# Patient Record
Sex: Female | Born: 1955 | Race: White | Hispanic: No | Smoking: Former smoker
Health system: Southern US, Community
[De-identification: ages and names within clinical notes are randomized; demographics above are authoritative.]

## PROBLEM LIST (undated history)

## (undated) DIAGNOSIS — N200 Calculus of kidney: Secondary | ICD-10-CM

## (undated) DIAGNOSIS — C801 Malignant (primary) neoplasm, unspecified: Secondary | ICD-10-CM

## (undated) DIAGNOSIS — R011 Cardiac murmur, unspecified: Secondary | ICD-10-CM

## (undated) DIAGNOSIS — N189 Chronic kidney disease, unspecified: Secondary | ICD-10-CM

## (undated) HISTORY — PX: SKIN CANCER EXCISION: SHX779

## (undated) HISTORY — PX: DILATION AND CURETTAGE OF UTERUS: SHX78

---

## 1997-01-22 DIAGNOSIS — N200 Calculus of kidney: Secondary | ICD-10-CM

## 1997-01-22 HISTORY — DX: Calculus of kidney: N20.0

## 2008-01-23 HISTORY — PX: BACK SURGERY: SHX140

## 2015-12-25 DIAGNOSIS — Z23 Encounter for immunization: Secondary | ICD-10-CM | POA: Diagnosis not present

## 2016-07-13 DIAGNOSIS — D2371 Other benign neoplasm of skin of right lower limb, including hip: Secondary | ICD-10-CM | POA: Diagnosis not present

## 2016-07-13 DIAGNOSIS — L821 Other seborrheic keratosis: Secondary | ICD-10-CM | POA: Diagnosis not present

## 2016-08-01 DIAGNOSIS — R1012 Left upper quadrant pain: Secondary | ICD-10-CM | POA: Diagnosis not present

## 2016-08-01 DIAGNOSIS — R222 Localized swelling, mass and lump, trunk: Secondary | ICD-10-CM | POA: Diagnosis not present

## 2016-08-02 ENCOUNTER — Other Ambulatory Visit: Payer: Self-pay | Admitting: Internal Medicine

## 2016-08-02 DIAGNOSIS — K56609 Unspecified intestinal obstruction, unspecified as to partial versus complete obstruction: Secondary | ICD-10-CM

## 2016-08-02 DIAGNOSIS — R1902 Left upper quadrant abdominal swelling, mass and lump: Secondary | ICD-10-CM

## 2016-08-03 ENCOUNTER — Ambulatory Visit
Admission: RE | Admit: 2016-08-03 | Discharge: 2016-08-03 | Disposition: A | Discharge status: Home / Self Care | Payer: BLUE CROSS/BLUE SHIELD | Source: Ambulatory Visit | Attending: Internal Medicine | Admitting: Internal Medicine

## 2016-08-03 DIAGNOSIS — R1902 Left upper quadrant abdominal swelling, mass and lump: Secondary | ICD-10-CM

## 2016-08-03 DIAGNOSIS — K56609 Unspecified intestinal obstruction, unspecified as to partial versus complete obstruction: Secondary | ICD-10-CM

## 2016-08-03 DIAGNOSIS — D259 Leiomyoma of uterus, unspecified: Secondary | ICD-10-CM | POA: Diagnosis not present

## 2016-08-03 MED ORDER — IOPAMIDOL (ISOVUE-300) INJECTION 61%
100.0000 mL | Freq: Once | INTRAVENOUS | Status: AC | PRN
  Administered 2016-08-03: 100 mL via INTRAVENOUS

## 2016-08-07 ENCOUNTER — Telehealth: Payer: Self-pay | Admitting: Hematology

## 2016-08-07 NOTE — Telephone Encounter (Signed)
Appt has been scheduled for the pt to see Dr. Irene Limbo on 7/20 at 1245pm. Pt agreed to the appt date and time. Demographics verified. Pt aware to arrive 30 minutes early.

## 2016-08-08 DIAGNOSIS — R5383 Other fatigue: Secondary | ICD-10-CM | POA: Diagnosis not present

## 2016-08-08 DIAGNOSIS — Z1211 Encounter for screening for malignant neoplasm of colon: Secondary | ICD-10-CM | POA: Diagnosis not present

## 2016-08-08 DIAGNOSIS — R161 Splenomegaly, not elsewhere classified: Secondary | ICD-10-CM | POA: Diagnosis not present

## 2016-08-10 ENCOUNTER — Telehealth: Payer: Self-pay | Admitting: Hematology

## 2016-08-10 ENCOUNTER — Other Ambulatory Visit: Payer: Self-pay | Admitting: General Surgery

## 2016-08-10 ENCOUNTER — Encounter: Payer: Self-pay | Admitting: Hematology

## 2016-08-10 ENCOUNTER — Ambulatory Visit: Payer: BLUE CROSS/BLUE SHIELD

## 2016-08-10 ENCOUNTER — Other Ambulatory Visit (HOSPITAL_COMMUNITY)
Admission: RE | Admit: 2016-08-10 | Discharge: 2016-08-10 | Disposition: A | Discharge status: Home / Self Care | Payer: BLUE CROSS/BLUE SHIELD | Source: Ambulatory Visit | Attending: Hematology | Admitting: Hematology

## 2016-08-10 ENCOUNTER — Ambulatory Visit (HOSPITAL_BASED_OUTPATIENT_CLINIC_OR_DEPARTMENT_OTHER): Payer: BLUE CROSS/BLUE SHIELD | Admitting: Hematology

## 2016-08-10 VITALS — BP 135/49 | HR 61 | Temp 98.4°F | Resp 18 | Ht 60.0 in | Wt 192.6 lb

## 2016-08-10 DIAGNOSIS — D696 Thrombocytopenia, unspecified: Secondary | ICD-10-CM | POA: Insufficient documentation

## 2016-08-10 DIAGNOSIS — R590 Localized enlarged lymph nodes: Secondary | ICD-10-CM

## 2016-08-10 DIAGNOSIS — N289 Disorder of kidney and ureter, unspecified: Secondary | ICD-10-CM | POA: Diagnosis not present

## 2016-08-10 DIAGNOSIS — R161 Splenomegaly, not elsewhere classified: Secondary | ICD-10-CM | POA: Diagnosis not present

## 2016-08-10 LAB — CBC & DIFF AND RETIC
BASO%: 0.4 % (ref 0.0–2.0)
BASOS ABS: 0 10*3/uL (ref 0.0–0.1)
EOS%: 1.2 % (ref 0.0–7.0)
Eosinophils Absolute: 0.1 10*3/uL (ref 0.0–0.5)
HEMATOCRIT: 38.5 % (ref 34.8–46.6)
HEMOGLOBIN: 12.3 g/dL (ref 11.6–15.9)
IMMATURE RETIC FRACT: 7.4 % (ref 1.60–10.00)
LYMPH#: 2.7 10*3/uL (ref 0.9–3.3)
LYMPH%: 54.1 % — ABNORMAL HIGH (ref 14.0–49.7)
MCH: 26.1 pg (ref 25.1–34.0)
MCHC: 31.9 g/dL (ref 31.5–36.0)
MCV: 81.7 fL (ref 79.5–101.0)
MONO#: 0.3 10*3/uL (ref 0.1–0.9)
MONO%: 6.6 % (ref 0.0–14.0)
NEUT#: 1.9 10*3/uL (ref 1.5–6.5)
NEUT%: 37.7 % — AB (ref 38.4–76.8)
Platelets: 112 10*3/uL — ABNORMAL LOW (ref 145–400)
RBC: 4.71 10*6/uL (ref 3.70–5.45)
RDW: 14.6 % — ABNORMAL HIGH (ref 11.2–14.5)
RETIC %: 1.46 % (ref 0.70–2.10)
RETIC CT ABS: 68.77 10*3/uL (ref 33.70–90.70)
WBC: 5 10*3/uL (ref 3.9–10.3)
nRBC: 0 % (ref 0–0)

## 2016-08-10 LAB — COMPREHENSIVE METABOLIC PANEL
ALBUMIN: 4 g/dL (ref 3.5–5.0)
ALK PHOS: 214 U/L — AB (ref 40–150)
ALT: 37 U/L (ref 0–55)
AST: 30 U/L (ref 5–34)
Anion Gap: 9 mEq/L (ref 3–11)
BILIRUBIN TOTAL: 0.8 mg/dL (ref 0.20–1.20)
BUN: 15.4 mg/dL (ref 7.0–26.0)
CALCIUM: 9.5 mg/dL (ref 8.4–10.4)
CO2: 27 mEq/L (ref 22–29)
Chloride: 105 mEq/L (ref 98–109)
Creatinine: 0.8 mg/dL (ref 0.6–1.1)
EGFR: 87 mL/min/{1.73_m2} — AB (ref >90)
Glucose: 84 mg/dl (ref 70–140)
POTASSIUM: 4.2 meq/L (ref 3.5–5.1)
Sodium: 142 mEq/L (ref 136–145)
TOTAL PROTEIN: 6.6 g/dL (ref 6.4–8.3)

## 2016-08-10 LAB — TECHNOLOGIST REVIEW

## 2016-08-10 LAB — SEDIMENTATION RATE: Sedimentation Rate-Westergren: 12 mm/hr (ref 0–40)

## 2016-08-10 LAB — CHCC SMEAR

## 2016-08-10 LAB — LACTATE DEHYDROGENASE: LDH: 178 U/L (ref 125–245)

## 2016-08-10 NOTE — Patient Instructions (Signed)
Thank you for choosing [facility] to provide your oncology and hematology care.  To afford each patient quality time with our providers, please arrive 30 minutes before your scheduled appointment time.  If you arrive late for your appointment, you may be asked to reschedule.  We strive to give you quality time with our providers, and arriving late affects you and other patients whose appointments are after yours.   If you are a no show for multiple scheduled visits, you may be dismissed from the clinic at the providers discretion.    Again, thank you for choosing [facility], our hope is that these requests will decrease the amount of time that you wait before being seen by our physicians.  ______________________________________________________________________  Should you have questions after your visit to the [facility], please contact our office at [phone] between the hours of 8:30 and 4:30 p.m.    Voicemails left after 4:30p.m will not be returned until the following business day.    For prescription refill requests, please have your pharmacy contact us directly.  Please also try to allow 48 hours for prescription requests.    Please contact the scheduling department for questions regarding scheduling.  For scheduling of procedures such as PET scans, CT scans, MRI, Ultrasound, etc please contact central scheduling at [phone].    Resources For Cancer Patients and Caregivers:   Oncolink.org:  A wonderful resource for patients and healthcare providers for information regarding your disease, ways to tract your treatment, what to expect, etc.     American Cancer Society:  800-227-2345  Can help patients locate various types of support and financial assistance  Cancer Care: 1-800-813-HOPE (4673) Provides financial assistance, online support groups, medication/co-pay assistance.    [place] DSS:  336-641-3447 Where to apply for food  stamps, Medicaid, and utility assistance  Medicare Rights Center: 800-333-4114 Helps people with Medicare understand their rights and benefits, navigate the Medicare system, and secure the quality healthcare they deserve  SCAT: 336-333-6589 [place] [employer]'s shared-ride transportation service for eligible riders who have a disability that prevents them from riding the fixed route bus.    For additional information on assistance programs please contact our social worker:   Grier Hock/Abigail Elmore:  [phone]            

## 2016-08-10 NOTE — Telephone Encounter (Signed)
Gave patient avs report and appointments for August. [facility] radiology will call re scan and bx.

## 2016-08-10 NOTE — Progress Notes (Signed)
[name]    HEMATOLOGY/ONCOLOGY CONSULTATION NOTE  Date of Service: 08/10/2016  Patient Care Team: Patient, No Pcp Per as PCP - General (General Practice)  PCP: [name] MD   CHIEF COMPLAINTS/PURPOSE OF CONSULTATION:  Massive Splenomegaly and abdominal lymphadenopathy.  HISTORY OF PRESENTING ILLNESS:   Wendy Avery is a wonderful 61 y.o. female who has been referred to [place] by Dr [facility]  for evaluation and management of possible lymphoma in the setting of massive splenomegaly and abdominal lymphadenopathy.  Patient patient has no significant chronic medical issues. She presented to urgent care with abdominal bloating and distention for 1-2 months. Also had some abdominal discomfort in the left upper quadrant of the abdomen and notes that she could feel a hard mass. She also notes some aches in her chest for 1-2 months and significant progressive fatigue which is starting to affect her quality of life. Over the last month she also notes some drenching night sweats but isn't sure if this is related to menopausal symptoms. Denies any overt weight loss. Patient has history of anxiety and notes severe anxiety over the symptoms.  Patient had a CT of the abdomen and pelvis with contrast on 08/03/2016 to further evaluate her symptoms. This showed massive splenomegaly with the spleen measuring 23.0 x 20.2 x 11.6 cm (volume = 2820 cm^3). Also noted to have concurrent  mild abdominal adenopathy. Incidental findings of Indeterminate left renal lesion. Most likely a minimally complex cyst. A solid neoplasm cannot be excluded.  Patient  notes that she wants to figure out the diagnosis ASAP and is very anxious. Reports no history of alcohol abuse or liver disease.  She has them out of colonoscopy but has had fecal occult blood testing recently which was negative. Last mammogram April 2017 with the normal limits. Last Pap smear August 2017 was within normal  limits.   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 Marital status: Unknown    Spouse name: N/A  . Number of children: N/A  . Years of education: N/A   Occupational History  . Not on file.   Social History Main Topics  . Smoking status: Not on file  . Smokeless tobacco: Not on file  . Alcohol use Not on file  . Drug use: Unknown  . Sexual activity: Not on file   Other Topics Concern  . Not on file   Social History Narrative  . No narrative on file  Ex-smoker 1 pack per day quit 30 years ago Uses alcohol socially She is widowed and has 2 daughters   FAMILY HISTORY: Mom had breast cancer at age 66 years and died from metastatic disease Father skin cancer, vocal cord cancer, alcohol abuse  ALLERGIES:  ?PCN  MEDICATIONS:  Current Outpatient Prescriptions  Medication Sig Dispense Refill  . ibuprofen (ADVIL,MOTRIN) 200 MG tablet Take 200 mg by mouth every 6 (six) hours as needed.     No current facility-administered medications for this visit.    Facility-Administered Medications Ordered in Other Visits  Medication Dose Route Frequency Provider Last Rate Last Dose  . 0.9 %  sodium chloride infusion   Intravenous Continuous Saverio Danker, PA-C  REVIEW OF SYSTEMS:    10 Point review of Systems was done is negative except as noted above.  PHYSICAL EXAMINATION: ECOG PERFORMANCE STATUS: 1 - Symptomatic but completely ambulatory  . Vitals:   08/10/16 1239  BP: (!) 135/49  Pulse: 61  Resp: 18  Temp: 98.4 F (36.9 C)   Filed Weights   08/10/16 1239  Weight: 192 lb 9.6 oz (87.4 kg)   .Body mass index is 37.61 kg/m.  GENERAL:alert, in no acute distress and  comfortable SKIN: no acute rashes, no significant lesions EYES: conjunctiva are pink and non-injected, sclera anicteric OROPHARYNX: MMM, no exudates, no oropharyngeal erythema or ulceration NECK: supple, no JVD LYMPH:  no palpable lymphadenopathy in the cervical, axillary or inguinal regions LUNGS: clear to auscultation b/l with normal respiratory effort HEART: regular rate & rhythm ABDOMEN:  normoactive bowel sounds , non tender, not distended. Massive splenomegaly palpable 10-12 cm below left costal margin in midclavicular line. Extremity: no pedal edema PSYCH: alert & oriented x 3 with fluent speech NEURO: no focal motor/sensory deficits  LABORATORY DATA:  I have reviewed the data as listed  . CBC Latest Ref Rng & Units 08/10/2016  WBC 3.9 - 10.3 10e3/uL 5.0  Hemoglobin 11.6 - 15.9 g/dL 12.3  Hematocrit 34.8 - 46.6 % 38.5  Platelets 145 - 400 10e3/uL 112 few large plts seen(L)    . CMP Latest Ref Rng & Units 08/10/2016  Glucose 70 - 140 mg/dl 84  BUN 7.0 - 26.0 mg/dL 15.4  Creatinine 0.6 - 1.1 mg/dL 0.8  Sodium 136 - 145 mEq/L 142  Potassium 3.5 - 5.1 mEq/L 4.2  CO2 22 - 29 mEq/L 27  Calcium 8.4 - 10.4 mg/dL 9.5  Total Protein 6.4 - 8.3 g/dL 6.6  Total Bilirubin 0.20 - 1.20 mg/dL 0.80  Alkaline Phos 40 - 150 U/L 214(H)  AST 5 - 34 U/L 30  ALT 0 - 55 U/L 37   Component     Latest Ref Rng & Units 08/10/2016  Sed Rate     0 - 40 mm/hr 12  LDH     125 - 245 U/L 178  EBV VCA IgM     0.0 - 35.9 U/mL <36.0  CMV IgM Ser EIA-aCnc     0.0 - 29.9 AU/mL <30.0  HIV Screen 4th Generation wRfx     Non Reactive Non Reactive  Hep C Virus Ab     0.0 - 0.9 s/co ratio <0.1    RADIOGRAPHIC STUDIES: I have personally reviewed the radiological images as listed and agreed with the findings in the report. Ct Abdomen Pelvis W Contrast  Result Date: 08/03/2016 CLINICAL DATA:  Left upper quadrant mass with intermittent pain. Bloating. Heartburn and indigestion. Irritable bowel syndrome  and constipation/diarrhea. EXAM: CT ABDOMEN AND PELVIS WITH CONTRAST TECHNIQUE: Multidetector CT imaging of the abdomen and pelvis was performed using the standard protocol following bolus administration of intravenous contrast. CONTRAST:  153m ISOVUE-300 IOPAMIDOL (ISOVUE-300) INJECTION 61% COMPARISON:  Plain films of 08/01/2016 FINDINGS: Lower chest: Clear lung bases. Normal heart size without pericardial or pleural effusion. Hepatobiliary: Normal liver. Normal gallbladder, without biliary ductal dilatation. Pancreas: Normal, without mass or ductal dilatation. Spleen: Marked splenomegaly. The spleen measures 23.0 x 20.2 x 11.6 cm (volume = 2820 cm^3). Vague hypoattenuation involving the inferior aspect of the spleen medially on portal venous phase images is likely related to decreased perfusion in this area. No well-defined mass or infarct on delayed images. Adrenals/Urinary Tract: Normal adrenal glands. Left  kidney is displaced medially by splenomegaly. An interpolar left renal lesion measures slightly greater than fluid density on image 49/ series 3. 1.5 cm. No hydronephrosis. Normal urinary bladder. Stomach/Bowel: Normal stomach, without wall thickening. Normal colon, appendix, and terminal ileum. Normal small bowel. Vascular/Lymphatic: Aortic and branch vessel atherosclerosis. Multiple small retroperitoneal nodes. A borderline enlarged left periaortic node measures 10 mm on image 43/series 3. preaortic adenopathy at 12 mm on image 38/series 3. No pelvic sidewall adenopathy. Reproductive: Uterine fundal fibroid is eccentric right and measures 3.2 cm on image 71/series 3. No adnexal mass. Other: No significant free fluid. Musculoskeletal: Bilateral L5 pars defects with grade 1 L5-S1 anterolisthesis. IMPRESSION: 1. Marked splenomegaly. Concurrent mild abdominal adenopathy. Findings are suspicious for lymphoma. 2.  Aortic Atherosclerosis (ICD10-I70.0). 3. Uterine fundal fibroid. 4. Indeterminate left renal  lesion. Most likely a minimally complex cyst. A solid neoplasm cannot be excluded. Recommend attention on follow-up. These results will be called to the ordering clinician or representative by the Radiologist Assistant, and communication documented in the PACS or zVision Dashboard. Electronically Signed   By: Abigail Miyamoto M.D.   On: 08/03/2016 15:01    ASSESSMENT & PLAN:   61 year old Caucasian female with  #1 Massive splenomegaly with the spleen measuring 23.0 x 20.2 x 11.6 cm (volume = 2820 cm^3). No evidence of liver disease on CT scan. No evidence of portal hypertension.  #2 mild intra-abdominal lymphadenopathy. Overall picture is concerning for a lymphoproliferative process. Patient's CBC does not show overt polycythemia or leukocytosis or thrombocytosis to suggest a myeloproliferative process though this cannot be ruled out.  LDH is within normal limits and reduces the likelihood of this being a more aggressive lymphoproliferative process.  #3 Mild thrombocytopenia platelets of 112k. 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  #4 Indetermine left renal lesion 1.5 cms in size ?complex cyst - will need monitoring.  PLAN -Imaging findings and diagnostic possibilities were discussed in details with the patient. -Lab workup was ordered as noted below to evaluate for MPN, rule out common viral infections. -Peripheral blood flow cytometry to determine presence of clonal lymphocyte population -CT-guided bone marrow aspiration and biopsy IR requested. -PET/CT scan to determine presence of other lymphadenopathy and FDG avidity of the spleen and direct additional diagnostic workup including evaluation for other lymphadenopathy given chest symptoms. -Plan of care was discussed in detail with the patient and she is agreeable to this. . Orders Placed This Encounter  Procedures  . NM PET Image Initial (PI) Skull Base To Thigh     Standing Status:   Future    Standing Expiration Date:   08/10/2017    Order Specific Question:   Reason for Exam (SYMPTOM  OR DIAGNOSIS REQUIRED)    Answer:   Patient with intra-abdominal lymphadenopathy, massive splenomegaly -concerning for lymphoma -- to determine best approach to diagnostic biopsy    Order Specific Question:   If indicated for the ordered procedure, I authorize the administration of a radiopharmaceutical per Radiology protocol    Answer:   Yes    Order Specific Question:   Is the patient pregnant?    Answer:   No    Order Specific Question:   Preferred imaging location?    Answer:   [facility]    Order Specific Question:   Radiology Contrast Protocol - do NOT remove file path    Answer:   \\[email or web address]\Radiant\NMPROTOCOLS.pdf  . CT Biopsy    Standing Status:  Future    Number of Occurrences:   1    Standing Expiration Date:   08/10/2017    Order Specific Question:   Lab orders requested (DO NOT place separate lab orders, these will be automatically ordered during procedure specimen collection):    Answer:   Surgical Pathology    Comments:   flox cytometry    Order Specific Question:   Reason for Exam (SYMPTOM  OR DIAGNOSIS REQUIRED)    Answer:   patient with massive splenomegaly and intra-abdominal LNadenopathy for Unilateral Bone marrow aspiration and Bx to r/o lymphoma/MPN    Order Specific Question:   Is patient pregnant?    Answer:   No    Order Specific Question:   Preferred imaging location?    Answer:   [facility]    Order Specific Question:   Radiology Contrast Protocol - do NOT remove file path    Answer:   \\[email or web address]\Radiant\CTProtocols.pdf  . CT BONE MARROW BIOPSY & ASPIRATION    Standing Status:   Future    Number of Occurrences:   1    Standing Expiration Date:   11/10/2017    Order Specific Question:   Reason for Exam (SYMPTOM  OR DIAGNOSIS REQUIRED)    Answer:   patient with massive splenomegaly and  intra-abdominal LNadenopathy for Unilateral Bone marrow aspiration and Bx to r/o lymphoma/MPN    Order Specific Question:   Is patient pregnant?    Answer:   No    Order Specific Question:   Preferred imaging location?    Answer:   [facility]    Order Specific Question:   Radiology Contrast Protocol - do NOT remove file path    Answer:   \\[email or web address]\Radiant\CTProtocols.pdf  . Comprehensive metabolic panel    Standing Status:   Future    Number of Occurrences:   1    Standing Expiration Date:   08/10/2017  . Lactate dehydrogenase    Standing Status:   Future    Number of Occurrences:   1    Standing Expiration Date:   08/10/2017  . Smear    Standing Status:   Future    Number of Occurrences:   1    Standing Expiration Date:   08/10/2017  . Flow Cytometry    Abnormal lymphocytes and Massive splenomegaly 23 cms -concerning for lymphoproliferative process    Standing Status:   Future    Number of Occurrences:   1    Standing Expiration Date:   08/10/2017  . JAK2 (including V617F and Exon 12), MPL, and CALR-Next Generation Sequencing    Standing Status:   Future    Number of Occurrences:   1    Standing Expiration Date:   08/10/2017  . Sedimentation rate  . Multiple Myeloma Panel (SPEP&IFE w/QIG)    Standing Status:   Future    Number of Occurrences:   1    Standing Expiration Date:   08/10/2017  . Epstein-Barr virus VCA, IgM    Standing Status:   Future    Number of Occurrences:   1    Standing Expiration Date:   08/10/2017  . CMV IgM    Standing Status:   Future    Number of Occurrences:   1    Standing Expiration Date:   08/10/2017  . HIV antibody (with reflex)    Standing Status:   Future    Number of Occurrences:   1    Standing Expiration Date:  08/10/2017  . Hepatitis C antibody    Standing Status:   Future    Number of Occurrences:   1    Standing Expiration Date:   08/10/2017  . CBC & Diff and Retic    Standing Status:   Future    Number of  Occurrences:   1    Standing Expiration Date:   08/10/2017    Labs today PET/CT ASAP CT bone marrow examination with IR in 3-4 days RTC with Dr Irene Limbo in 10-14 days after PET/CT and BM Bx   All of the patients questions were answered with apparent satisfaction. The patient knows to call the clinic with any problems, questions or concerns.  I spent 60 minutes counseling the patient face to face. The total time spent in the appointment was 80 minutes and more than 50% was on counseling and direct patient cares.    Sullivan Lone MD [place] AAHIVMS [facility] Hematology/Oncology Physician [facility]  (Office):       [phone] (Work cell):  [phone] (Fax):           [phone]  08/10/2016 1:01 PM

## 2016-08-11 LAB — EPSTEIN-BARR VIRUS VCA, IGM: EBV VCA IgM: <36 U/mL (ref 0.0–35.9)

## 2016-08-11 LAB — HEPATITIS C ANTIBODY

## 2016-08-11 LAB — CMV IGM: CMV IgM Ser EIA-aCnc: <30 AU/mL (ref 0.0–29.9)

## 2016-08-11 LAB — HIV ANTIBODY (ROUTINE TESTING W REFLEX): HIV Screen 4th Generation wRfx: NONREACTIVE

## 2016-08-13 ENCOUNTER — Telehealth: Payer: Self-pay

## 2016-08-13 ENCOUNTER — Encounter (HOSPITAL_COMMUNITY): Payer: Self-pay

## 2016-08-13 ENCOUNTER — Ambulatory Visit (HOSPITAL_COMMUNITY)
Admission: RE | Admit: 2016-08-13 | Discharge: 2016-08-13 | Disposition: A | Discharge status: Home / Self Care | Payer: BLUE CROSS/BLUE SHIELD | Source: Ambulatory Visit | Attending: Hematology | Admitting: Hematology

## 2016-08-13 DIAGNOSIS — E119 Type 2 diabetes mellitus without complications: Secondary | ICD-10-CM | POA: Diagnosis not present

## 2016-08-13 DIAGNOSIS — Z9889 Other specified postprocedural states: Secondary | ICD-10-CM | POA: Diagnosis not present

## 2016-08-13 DIAGNOSIS — F419 Anxiety disorder, unspecified: Secondary | ICD-10-CM | POA: Diagnosis not present

## 2016-08-13 DIAGNOSIS — Z79899 Other long term (current) drug therapy: Secondary | ICD-10-CM | POA: Insufficient documentation

## 2016-08-13 DIAGNOSIS — R161 Splenomegaly, not elsewhere classified: Secondary | ICD-10-CM | POA: Diagnosis not present

## 2016-08-13 DIAGNOSIS — D696 Thrombocytopenia, unspecified: Secondary | ICD-10-CM | POA: Insufficient documentation

## 2016-08-13 DIAGNOSIS — Z880 Allergy status to penicillin: Secondary | ICD-10-CM | POA: Diagnosis not present

## 2016-08-13 DIAGNOSIS — R590 Localized enlarged lymph nodes: Secondary | ICD-10-CM | POA: Diagnosis not present

## 2016-08-13 DIAGNOSIS — I2510 Atherosclerotic heart disease of native coronary artery without angina pectoris: Secondary | ICD-10-CM | POA: Diagnosis not present

## 2016-08-13 DIAGNOSIS — Z85828 Personal history of other malignant neoplasm of skin: Secondary | ICD-10-CM | POA: Diagnosis not present

## 2016-08-13 DIAGNOSIS — K589 Irritable bowel syndrome without diarrhea: Secondary | ICD-10-CM | POA: Diagnosis not present

## 2016-08-13 DIAGNOSIS — D72820 Lymphocytosis (symptomatic): Secondary | ICD-10-CM | POA: Insufficient documentation

## 2016-08-13 DIAGNOSIS — C8519 Unspecified B-cell lymphoma, extranodal and solid organ sites: Secondary | ICD-10-CM | POA: Diagnosis not present

## 2016-08-13 HISTORY — DX: Cardiac murmur, unspecified: R01.1

## 2016-08-13 HISTORY — DX: Chronic kidney disease, unspecified: N18.9

## 2016-08-13 HISTORY — DX: Calculus of kidney: N20.0

## 2016-08-13 LAB — PROTIME-INR
INR: 1.03
Prothrombin Time: 13.5 seconds (ref 11.4–15.2)

## 2016-08-13 LAB — CBC WITH DIFFERENTIAL/PLATELET
BASOS PCT: 0 %
Basophils Absolute: 0 10*3/uL (ref 0.0–0.1)
EOS ABS: 0.1 10*3/uL (ref 0.0–0.7)
Eosinophils Relative: 1 %
HEMATOCRIT: 37.1 % (ref 36.0–46.0)
HEMOGLOBIN: 12.2 g/dL (ref 12.0–15.0)
LYMPHS ABS: 3.2 10*3/uL (ref 0.7–4.0)
Lymphocytes Relative: 60 %
MCH: 25.8 pg — ABNORMAL LOW (ref 26.0–34.0)
MCHC: 32.9 g/dL (ref 30.0–36.0)
MCV: 78.4 fL (ref 78.0–100.0)
Monocytes Absolute: 0.3 10*3/uL (ref 0.1–1.0)
Monocytes Relative: 6 %
NEUTROS ABS: 1.8 10*3/uL (ref 1.7–7.7)
NEUTROS PCT: 34 %
Platelets: 105 10*3/uL — ABNORMAL LOW (ref 150–400)
RBC: 4.73 MIL/uL (ref 3.87–5.11)
RDW: 14.4 % (ref 11.5–15.5)
WBC: 5.4 10*3/uL (ref 4.0–10.5)

## 2016-08-13 MED ORDER — FENTANYL CITRATE (PF) 100 MCG/2ML IJ SOLN
INTRAMUSCULAR | Status: AC
  Filled 2016-08-13: qty 2

## 2016-08-13 MED ORDER — MIDAZOLAM HCL 2 MG/2ML IJ SOLN
INTRAMUSCULAR | Status: AC
  Filled 2016-08-13: qty 2

## 2016-08-13 MED ORDER — SODIUM CHLORIDE 0.9 % IV SOLN
INTRAVENOUS | Status: DC
  Administered 2016-08-13: 09:00:00 via INTRAVENOUS

## 2016-08-13 MED ORDER — FENTANYL CITRATE (PF) 100 MCG/2ML IJ SOLN
INTRAMUSCULAR | Status: AC | PRN
  Administered 2016-08-13 (×2): 50 ug via INTRAVENOUS

## 2016-08-13 MED ORDER — MIDAZOLAM HCL 2 MG/2ML IJ SOLN
INTRAMUSCULAR | Status: AC | PRN
  Administered 2016-08-13 (×2): 1 mg via INTRAVENOUS

## 2016-08-13 NOTE — Procedures (Signed)
Interventional Radiology Procedure Note  Procedure: CT guided aspirate and core biopsy of right posterior iliac bone Complications: None Recommendations: - Bedrest supine x 1 hrs - OTC's PRN  Pain - Follow biopsy results  Signed,  Shahida Schnackenberg S. Raquel Sayres, DO    

## 2016-08-13 NOTE — Progress Notes (Signed)
Patient in Short Stay post procedure. Spoke to daughter Oneita Kras pre and post procedure via phone. She is at work and will be able to come pick her mom up at approximately 1430 (pt able to be d/c to home at 1245 but needs to stay here until daughter can come). Oneita Kras and patient aware of need for a responsible adult to be with patient 24 hours post procedure. Currently patient sitting up in bed eating crackers and drinking coke. States no pain.

## 2016-08-13 NOTE — Progress Notes (Signed)
Home with daughter. Aware of all d/c instructions.

## 2016-08-13 NOTE — Consult Note (Signed)
Chief Complaint: Patient was seen in consultation today for  CT-guided bone marrow biopsy  Referring Physician(s): Brunetta Genera  Supervising Physician: Corrie Mckusick  Patient Status: [facility] - Out-pt  History of Present Illness: Wendy Avery is a 61 y.o. female with intermittent left upper quadrant abdominal discomfort, bloating, heartburn, indigestion, irritable bowel syndrome and recent imaging revealing marked splenomegaly with intra-abdominal lymphadenopathy suspicious for lymphoma. She presents today for CT-guided bone marrow biopsy for further evaluation.  PMH: IBS, anxiety, basal cell skin ca; denies HTN.DM,CAD FAO:ZHYQ cyst removal,D&C  Allergies: Penicillins  Medications: Prior to Admission medications   Medication Sig Start Date End Date Taking? Authorizing Provider  ibuprofen (ADVIL,MOTRIN) 200 MG tablet Take 200 mg by mouth every 6 (six) hours as needed.    [provider]     No family history on file.  Social History   Social History  . Marital status: Widowed    Spouse name: N/A  . Number of children: N/A  . Years of education: N/A   Social History Main Topics  . Smoking status: Not on file  . Smokeless tobacco: Not on file  . Alcohol use Not on file  . Drug use: Unknown  . Sexual activity: Not on file   Other Topics Concern  . Not on file   Social History Narrative  . No narrative on file      Review of Systems see above; denies fever,  dyspnea, cough, back pain, nausea, vomiting or abnormal bleeding. She does have occasional HA/ palpitations, anxiety, intermittent left leg cramps  Vital Signs: BP (!) 117/56 (BP Location: Right Arm)   Pulse 63   Temp 98 F (36.7 C) (Oral)   Resp 16   Wt 192 lb 3.2 oz (87.2 kg)   SpO2 97%   BMI 37.54 kg/m   Physical Exam awake, alert. Chest clear to auscultation bilaterally. Heart with regular rate and rhythm. Abdomen soft, obese, NT,  positive bowel sounds, splenomegaly; no LE  edema  Mallampati Score:     Imaging: Ct Abdomen Pelvis W Contrast  Result Date: 08/03/2016 CLINICAL DATA:  Left upper quadrant mass with intermittent pain. Bloating. Heartburn and indigestion. Irritable bowel syndrome and constipation/diarrhea. EXAM: CT ABDOMEN AND PELVIS WITH CONTRAST TECHNIQUE: Multidetector CT imaging of the abdomen and pelvis was performed using the standard protocol following bolus administration of intravenous contrast. CONTRAST:  137m ISOVUE-300 IOPAMIDOL (ISOVUE-300) INJECTION 61% COMPARISON:  Plain films of 08/01/2016 FINDINGS: Lower chest: Clear lung bases. Normal heart size without pericardial or pleural effusion. Hepatobiliary: Normal liver. Normal gallbladder, without biliary ductal dilatation. Pancreas: Normal, without mass or ductal dilatation. Spleen: Marked splenomegaly. The spleen measures 23.0 x 20.2 x 11.6 cm (volume = 2820 cm^3). Vague hypoattenuation involving the inferior aspect of the spleen medially on portal venous phase images is likely related to decreased perfusion in this area. No well-defined mass or infarct on delayed images. Adrenals/Urinary Tract: Normal adrenal glands. Left kidney is displaced medially by splenomegaly. An interpolar left renal lesion measures slightly greater than fluid density on image 49/ series 3. 1.5 cm. No hydronephrosis. Normal urinary bladder. Stomach/Bowel: Normal stomach, without wall thickening. Normal colon, appendix, and terminal ileum. Normal small bowel. Vascular/Lymphatic: Aortic and branch vessel atherosclerosis. Multiple small retroperitoneal nodes. A borderline enlarged left periaortic node measures 10 mm on image 43/series 3. preaortic adenopathy at 12 mm on image 38/series 3. No pelvic sidewall adenopathy. Reproductive: Uterine fundal fibroid is eccentric right and measures 3.2 cm on image 71/series 3. No  adnexal mass. Other: No significant free fluid. Musculoskeletal: Bilateral L5 pars defects with grade 1 L5-S1  anterolisthesis. IMPRESSION: 1. Marked splenomegaly. Concurrent mild abdominal adenopathy. Findings are suspicious for lymphoma. 2.  Aortic Atherosclerosis (ICD10-I70.0). 3. Uterine fundal fibroid. 4. Indeterminate left renal lesion. Most likely a minimally complex cyst. A solid neoplasm cannot be excluded. Recommend attention on follow-up. These results will be called to the ordering clinician or representative by the Radiologist Assistant, and communication documented in the PACS or zVision Dashboard. Electronically Signed   By: Abigail Miyamoto M.D.   On: 08/03/2016 15:01    Labs:  CBC:  Recent Labs  08/10/16 1449  WBC 5.0  HGB 12.3  HCT 38.5  PLT 112 few large plts seen*    COAGS: No results for input(s): INR, APTT in the last 8760 hours.  BMP:  Recent Labs  08/10/16 1422  NA 142  K 4.2  CO2 27  GLUCOSE 84  BUN 15.4  CALCIUM 9.5  CREATININE 0.8    LIVER FUNCTION TESTS:  Recent Labs  08/10/16 1422  BILITOT 0.80  AST 30  ALT 37  ALKPHOS 214*  PROT 6.6  ALBUMIN 4.0    TUMOR MARKERS: No results for input(s): AFPTM, CEA, CA199, CHROMGRNA in the last 8760 hours.  Assessment and Plan: 61 y.o. female with intermittent left upper quadrant abdominal discomfort, bloating, heartburn, indigestion, irritable bowel syndrome and recent imaging revealing marked splenomegaly with intra-abdominal lymphadenopathy suspicious for lymphoma. She presents today for CT-guided bone marrow biopsy for further evaluation.Risks and benefits discussed with the patient including, but not limited to bleeding, infection, damage to adjacent structures or low yield requiring additional tests. All of the patient's questions were answered, patient is agreeable to proceed. Consent signed and in chart.     Thank you for this interesting consult.  I greatly enjoyed meeting Molina Hollenback and look forward to participating in their care.  A copy of this report was sent to the requesting provider on this  date.  Electronically Signed: D. Rowe Robert, PA-C 08/13/2016, 9:02 AM   I spent a total of 25 minutes face to face in clinical consultation, greater than 50% of which was counseling/coordinating care for CT-guided bone marrow biopsy

## 2016-08-13 NOTE — Discharge Instructions (Signed)
Bone Marrow Aspiration and Bone Marrow Biopsy, Adult, Care After °This sheet gives you information about how to care for yourself after your procedure. Your health care provider may also give you more specific instructions. If you have problems or questions, contact your health care provider. °What can I expect after the procedure? °After the procedure, it is common to have: °· Mild pain and tenderness. °· Swelling. °· Bruising. ° °Follow these instructions at home: °· Take over-the-counter or prescription medicines only as told by your health care provider. °· Do not take baths, swim, or use a hot tub until your health care provider approves. Ask if you can take a shower or have a sponge bath. °· Follow instructions from your health care provider about how to take care of the puncture site. Make sure you: °? Wash your hands with soap and water before you change your bandage (dressing). If soap and water are not available, use hand sanitizer. °? Change your dressing as told by your health care provider. °· Check your puncture site every day for signs of infection. Check for: °? More redness, swelling, or pain. °? More fluid or blood. °? Warmth. °? Pus or a bad smell. °· Return to your normal activities as told by your health care provider. Ask your health care provider what activities are safe for you. °· Do not drive for 24 hours if you were given a medicine to help you relax (sedative). °· Keep all follow-up visits as told by your health care provider. This is important. °Contact a health care provider if: °· You have more redness, swelling, or pain around the puncture site. °· You have more fluid or blood coming from the puncture site. °· Your puncture site feels warm to the touch. °· You have pus or a bad smell coming from the puncture site. °· You have a fever. °· Your pain is not controlled with medicine. °This information is not intended to replace advice given to you by your health care provider. Make sure  you discuss any questions you have with your health care provider. °Document Released: 07/28/2004 Document Revised: 07/29/2015 Document Reviewed: 06/22/2015 °Elsevier Interactive Patient Education © 2018 Elsevier Inc. ° °Moderate Conscious Sedation, Adult, Care After °These instructions provide you with information about caring for yourself after your procedure. Your health care provider may also give you more specific instructions. Your treatment has been planned according to current medical practices, but problems sometimes occur. Call your health care provider if you have any problems or questions after your procedure. °What can I expect after the procedure? °After your procedure, it is common: °· To feel sleepy for several hours. °· To feel clumsy and have poor balance for several hours. °· To have poor judgment for several hours. °· To vomit if you eat too soon. ° °Follow these instructions at home: °For at least 24 hours after the procedure: ° °· Do not: °? Participate in activities where you could fall or become injured. °? Drive. °? Use heavy machinery. °? Drink alcohol. °? Take sleeping pills or medicines that cause drowsiness. °? Make important decisions or sign legal documents. °? Take care of children on your own. °· Rest. °Eating and drinking °· Follow the diet recommended by your health care provider. °· If you vomit: °? Drink water, juice, or soup when you can drink without vomiting. °? Make sure you have little or no nausea before eating solid foods. °General instructions °· Have a responsible adult stay with you until you   are awake and alert.  Take over-the-counter and prescription medicines only as told by your health care provider.  If you smoke, do not smoke without supervision.  Keep all follow-up visits as told by your health care provider. This is important. Contact a health care provider if:  You keep feeling nauseous or you keep vomiting.  You feel light-headed.  You develop a  rash.  You have a fever. Get help right away if:  You have trouble breathing. This information is not intended to replace advice given to you by your health care provider. Make sure you discuss any questions you have with your health care provider. Document Released: 10/29/2012 Document Revised: 06/13/2015 Document Reviewed: 04/30/2015 Elsevier Interactive Patient Education  [employer].

## 2016-08-13 NOTE — Telephone Encounter (Signed)
Imaging desk available for p/u. Pt has bmx today and MD appt 8/2. Can come by either day. Left message with pt.

## 2016-08-14 LAB — MULTIPLE MYELOMA PANEL, SERUM
ALBUMIN SERPL ELPH-MCNC: 3.7 g/dL (ref 2.9–4.4)
ALPHA 1: 0.3 g/dL (ref 0.0–0.4)
Albumin/Glob SerPl: 1.4 (ref 0.7–1.7)
Alpha2 Glob SerPl Elph-Mcnc: 0.6 g/dL (ref 0.4–1.0)
B-GLOBULIN SERPL ELPH-MCNC: 1 g/dL (ref 0.7–1.3)
Gamma Glob SerPl Elph-Mcnc: 0.9 g/dL (ref 0.4–1.8)
Globulin, Total: 2.7 g/dL (ref 2.2–3.9)
IGA/IMMUNOGLOBULIN A, SERUM: 85 mg/dL — AB (ref 87–352)
IgG, Qn, Serum: 840 mg/dL (ref 700–1600)
IgM, Qn, Serum: 25 mg/dL — ABNORMAL LOW (ref 26–217)
TOTAL PROTEIN: 6.4 g/dL (ref 6.0–8.5)

## 2016-08-15 ENCOUNTER — Ambulatory Visit (HOSPITAL_COMMUNITY)
Admission: RE | Admit: 2016-08-15 | Discharge: 2016-08-15 | Disposition: A | Discharge status: Home / Self Care | Payer: BLUE CROSS/BLUE SHIELD | Source: Ambulatory Visit | Attending: Hematology | Admitting: Hematology

## 2016-08-15 DIAGNOSIS — R590 Localized enlarged lymph nodes: Secondary | ICD-10-CM

## 2016-08-15 DIAGNOSIS — R161 Splenomegaly, not elsewhere classified: Secondary | ICD-10-CM | POA: Insufficient documentation

## 2016-08-15 LAB — GLUCOSE, CAPILLARY: Glucose-Capillary: 93 mg/dL (ref 65–99)

## 2016-08-15 MED ORDER — FLUDEOXYGLUCOSE F - 18 (FDG) INJECTION
9.5000 | Freq: Once | INTRAVENOUS | Status: AC
  Administered 2016-08-15: 9.5 via INTRAVENOUS

## 2016-08-16 ENCOUNTER — Telehealth: Payer: Self-pay

## 2016-08-16 NOTE — Telephone Encounter (Signed)
Pt felt something moving under her left ribcage last night. She has been sleeping poorly, changing positions frequently d/t discomfort.  Today pt felt pinching in left lower ribs area when sitting at her desk and she was taking a deep breath. She was wearing jeans. The pinching pressure eased when she changed positions.  She is anxious as well. She is also feeling pressure pain in her back in the same area. She is feeling uncomfortable in her chest, rib cage and back.  "I feel like I can't make it another week". She has not taken ibuprofen, instructed her to use it. Asked if she needed something for anxiety and she started describing her discomfort again rather than answering the question. She is changing her clothes to looser items. She was letting us know her symptoms but also asking if there is anything we could do to help her. Her PET was yesterday, her CT and Bx were 7/23.  Her next appt 8/2.

## 2016-08-17 ENCOUNTER — Telehealth: Payer: Self-pay

## 2016-08-17 LAB — FLOW CYTOMETRY

## 2016-08-17 NOTE — Telephone Encounter (Signed)
Pt anxious about results of PET and BMX. To discuss with Dr. Irene Limbo at appt next Thursday. Pt c/o L sided discomfort and intermittent pain yesterday. Recommended not taking ibuprofen per MD. Pt stated she had not taken any pain medication yesterday. Discomfort resolved today. Wearing looser clothes to minimize pinching sensation. Not sure the cause of the irritation yesterday, could potentially have been gas.

## 2016-08-23 ENCOUNTER — Telehealth: Payer: Self-pay

## 2016-08-23 ENCOUNTER — Ambulatory Visit (HOSPITAL_BASED_OUTPATIENT_CLINIC_OR_DEPARTMENT_OTHER): Payer: BLUE CROSS/BLUE SHIELD | Admitting: Hematology

## 2016-08-23 VITALS — BP 130/46 | HR 64 | Temp 98.7°F | Resp 18 | Ht 60.0 in | Wt 194.3 lb

## 2016-08-23 DIAGNOSIS — R591 Generalized enlarged lymph nodes: Secondary | ICD-10-CM

## 2016-08-23 DIAGNOSIS — N289 Disorder of kidney and ureter, unspecified: Secondary | ICD-10-CM | POA: Diagnosis not present

## 2016-08-23 DIAGNOSIS — F418 Other specified anxiety disorders: Secondary | ICD-10-CM | POA: Diagnosis not present

## 2016-08-23 DIAGNOSIS — D696 Thrombocytopenia, unspecified: Secondary | ICD-10-CM

## 2016-08-23 DIAGNOSIS — C8307 Small cell B-cell lymphoma, spleen: Secondary | ICD-10-CM

## 2016-08-23 NOTE — Telephone Encounter (Signed)
Printed avs and scheduled appt. For August and education class

## 2016-08-24 ENCOUNTER — Other Ambulatory Visit: Payer: BLUE CROSS/BLUE SHIELD

## 2016-08-24 ENCOUNTER — Telehealth: Payer: Self-pay

## 2016-08-24 ENCOUNTER — Encounter: Payer: Self-pay | Admitting: *Deleted

## 2016-08-24 ENCOUNTER — Encounter: Payer: Self-pay | Admitting: General Practice

## 2016-08-24 DIAGNOSIS — C8307 Small cell B-cell lymphoma, spleen: Secondary | ICD-10-CM | POA: Insufficient documentation

## 2016-08-24 MED ORDER — DEXAMETHASONE 2 MG PO TABS: 4.0000 mg | ORAL_TABLET | Freq: Every day | ORAL | 0 refills | Status: DC

## 2016-08-24 MED ORDER — ALLOPURINOL 100 MG PO TABS: 100.0000 mg | ORAL_TABLET | Freq: Two times a day (BID) | ORAL | 0 refills | Status: DC

## 2016-08-24 NOTE — Telephone Encounter (Signed)
Pt calling back again to question when to start taking allopurinol and dexamethasone. Per Dr. Irene Limbo, pt to start taking tomorrow. Decadron daily and allopurinol twice daily (morning and evening). Pt verbalized understanding.

## 2016-08-24 NOTE — Telephone Encounter (Signed)
Pt has already s/w Aldona Bar and has her question answered.

## 2016-08-24 NOTE — Progress Notes (Signed)
[place] Spiritual Care Note  Met Wendy Avery in chemo ed today.  She became tearful during my portion on Support Center team/resources.  Provided pastoral presence, empathic listening, and normalization of feelings.  Encouraged her and group to participate in programming and to get to know Support Team.  Plan to f/u by phone Monday for support and encouragement.   [place], [place], [facility] Pager [phone] Voicemail [phone]

## 2016-08-24 NOTE — Progress Notes (Signed)
START ON PATHWAY REGIMEN - Lymphoma and CLL     Administer weekly:     Rituximab   **Always confirm dose/schedule in your pharmacy ordering system**    Patient Characteristics: Marginal Zone Lymphoma, Systemic, First Line, Symptomatic Disease Type: Marginal Zone Disease Type: Not Applicable Localized or Systemic Disease? Systemic [place] Stage: IV Line of therapy: First Line Asymptomatic or Symptomatic? Symptomatic Intent of Therapy: Non-Curative / Palliative Intent, Discussed with Patient

## 2016-08-27 ENCOUNTER — Encounter: Payer: Self-pay | Admitting: General Practice

## 2016-08-27 NOTE — Progress Notes (Signed)
[place] Spiritual Care Note  Followed up with Remo Lipps by phone after she was tearful in chemo class Friday.  She appreciated support, is taking it one step at a time, and plans to return call when not at work.     [place], [place], [facility] Pager [phone] Voicemail [phone]

## 2016-08-30 ENCOUNTER — Encounter (HOSPITAL_COMMUNITY): Payer: Self-pay

## 2016-08-31 ENCOUNTER — Other Ambulatory Visit (HOSPITAL_BASED_OUTPATIENT_CLINIC_OR_DEPARTMENT_OTHER): Payer: BLUE CROSS/BLUE SHIELD

## 2016-08-31 ENCOUNTER — Ambulatory Visit (HOSPITAL_BASED_OUTPATIENT_CLINIC_OR_DEPARTMENT_OTHER): Payer: BLUE CROSS/BLUE SHIELD

## 2016-08-31 VITALS — BP 107/47 | HR 69 | Temp 97.5°F | Resp 17

## 2016-08-31 DIAGNOSIS — C8307 Small cell B-cell lymphoma, spleen: Secondary | ICD-10-CM

## 2016-08-31 DIAGNOSIS — Z5112 Encounter for antineoplastic immunotherapy: Secondary | ICD-10-CM | POA: Diagnosis not present

## 2016-08-31 LAB — CBC & DIFF AND RETIC
BASO%: 0.1 % (ref 0.0–2.0)
BASOS ABS: 0 10*3/uL (ref 0.0–0.1)
EOS%: 0.6 % (ref 0.0–7.0)
Eosinophils Absolute: 0.1 10*3/uL (ref 0.0–0.5)
HEMATOCRIT: 42 % (ref 34.8–46.6)
HEMOGLOBIN: 13.5 g/dL (ref 11.6–15.9)
IMMATURE RETIC FRACT: 5.2 % (ref 1.60–10.00)
LYMPH%: 58.6 % — AB (ref 14.0–49.7)
MCH: 26.3 pg (ref 25.1–34.0)
MCHC: 32.1 g/dL (ref 31.5–36.0)
MCV: 81.7 fL (ref 79.5–101.0)
MONO#: 0.6 10*3/uL (ref 0.1–0.9)
MONO%: 5.7 % (ref 0.0–14.0)
NEUT#: 3.9 10*3/uL (ref 1.5–6.5)
NEUT%: 35 % — AB (ref 38.4–76.8)
PLATELETS: 147 10*3/uL (ref 145–400)
RBC: 5.14 10*6/uL (ref 3.70–5.45)
RDW: 14.8 % — ABNORMAL HIGH (ref 11.2–14.5)
Retic %: 1.94 % (ref 0.70–2.10)
Retic Ct Abs: 99.72 10*3/uL — ABNORMAL HIGH (ref 33.70–90.70)
WBC: 11.1 10*3/uL — ABNORMAL HIGH (ref 3.9–10.3)
lymph#: 6.5 10*3/uL — ABNORMAL HIGH (ref 0.9–3.3)

## 2016-08-31 LAB — COMPREHENSIVE METABOLIC PANEL
ALBUMIN: 3.8 g/dL (ref 3.5–5.0)
ALT: 27 U/L (ref 0–55)
ANION GAP: 10 meq/L (ref 3–11)
AST: 17 U/L (ref 5–34)
Alkaline Phosphatase: 132 U/L (ref 40–150)
BILIRUBIN TOTAL: 0.67 mg/dL (ref 0.20–1.20)
BUN: 16.7 mg/dL (ref 7.0–26.0)
CALCIUM: 9.6 mg/dL (ref 8.4–10.4)
CO2: 24 mEq/L (ref 22–29)
CREATININE: 0.8 mg/dL (ref 0.6–1.1)
Chloride: 106 mEq/L (ref 98–109)
EGFR: 79 mL/min/{1.73_m2} — AB (ref >90)
Glucose: 163 mg/dl — ABNORMAL HIGH (ref 70–140)
Potassium: 3.5 mEq/L (ref 3.5–5.1)
Sodium: 140 mEq/L (ref 136–145)
TOTAL PROTEIN: 6.5 g/dL (ref 6.4–8.3)

## 2016-08-31 LAB — TECHNOLOGIST REVIEW

## 2016-08-31 LAB — URIC ACID: Uric Acid, Serum: 4 mg/dl (ref 2.6–7.4)

## 2016-08-31 MED ORDER — SODIUM CHLORIDE 0.9 % IV SOLN: Freq: Once | INTRAVENOUS | Status: DC | PRN

## 2016-08-31 MED ORDER — DEXAMETHASONE SODIUM PHOSPHATE 10 MG/ML IJ SOLN
INTRAMUSCULAR | Status: AC
  Filled 2016-08-31: qty 1

## 2016-08-31 MED ORDER — DIPHENHYDRAMINE HCL 25 MG PO CAPS
ORAL_CAPSULE | ORAL | Status: AC
  Filled 2016-08-31: qty 2

## 2016-08-31 MED ORDER — ACETAMINOPHEN 325 MG PO TABS
650.0000 mg | ORAL_TABLET | Freq: Once | ORAL | Status: AC
  Administered 2016-08-31: 650 mg via ORAL

## 2016-08-31 MED ORDER — LORAZEPAM 2 MG/ML IJ SOLN
0.5000 mg | Freq: Once | INTRAMUSCULAR | Status: AC
  Administered 2016-08-31: 0.5 mg via INTRAVENOUS

## 2016-08-31 MED ORDER — SODIUM CHLORIDE 0.9 % IV SOLN
Freq: Once | INTRAVENOUS | Status: AC
  Administered 2016-08-31: 10:00:00 via INTRAVENOUS

## 2016-08-31 MED ORDER — DEXAMETHASONE SODIUM PHOSPHATE 10 MG/ML IJ SOLN
10.0000 mg | Freq: Once | INTRAMUSCULAR | Status: AC
  Administered 2016-08-31: 10 mg via INTRAVENOUS

## 2016-08-31 MED ORDER — ACETAMINOPHEN 325 MG PO TABS
ORAL_TABLET | ORAL | Status: AC
  Filled 2016-08-31: qty 2

## 2016-08-31 MED ORDER — DEXAMETHASONE SODIUM PHOSPHATE 100 MG/10ML IJ SOLN: 10.0000 mg | Freq: Once | INTRAMUSCULAR | Status: DC

## 2016-08-31 MED ORDER — METHYLPREDNISOLONE SODIUM SUCC 125 MG IJ SOLR
125.0000 mg | Freq: Once | INTRAMUSCULAR | Status: AC | PRN
  Administered 2016-08-31: 125 mg via INTRAVENOUS

## 2016-08-31 MED ORDER — MONTELUKAST SODIUM 10 MG PO TABS
10.0000 mg | ORAL_TABLET | Freq: Every day | ORAL | Status: DC
  Administered 2016-08-31: 10 mg via ORAL

## 2016-08-31 MED ORDER — FAMOTIDINE IN NACL 20-0.9 MG/50ML-% IV SOLN
20.0000 mg | Freq: Once | INTRAVENOUS | Status: AC | PRN
  Administered 2016-08-31: 20 mg via INTRAVENOUS

## 2016-08-31 MED ORDER — DIPHENHYDRAMINE HCL 25 MG PO CAPS
50.0000 mg | ORAL_CAPSULE | Freq: Once | ORAL | Status: AC
  Administered 2016-08-31: 50 mg via ORAL

## 2016-08-31 MED ORDER — SODIUM CHLORIDE 0.9 % IV SOLN
375.0000 mg/m2 | Freq: Once | INTRAVENOUS | Status: AC
  Administered 2016-08-31: 700 mg via INTRAVENOUS
  Filled 2016-08-31: qty 50

## 2016-08-31 MED ORDER — LORAZEPAM 2 MG/ML IJ SOLN
INTRAMUSCULAR | Status: AC
  Filled 2016-08-31: qty 1

## 2016-08-31 MED ORDER — LORAZEPAM 1 MG PO TABS: 0.5000 mg | ORAL_TABLET | Freq: Once | ORAL | Status: DC

## 2016-08-31 MED ORDER — MONTELUKAST SODIUM 10 MG PO TABS
ORAL_TABLET | ORAL | Status: AC
  Filled 2016-08-31: qty 1

## 2016-08-31 NOTE — Progress Notes (Signed)
1134 pt face/neck  Flushed, c/o back pain, chest pounding VS BP-76/65, T- 98.6, HR 52, NS wide open to gravity, O2 via [place] at 2L 1136 Solumedrol 125mg  IVP 1137 Pepcid= MD at chairside  1138 VSS 105/66, T-98.6 HR-65 1140 VSS 129/58 HR 63 pt states she is feeling better, mild flushing to face/neck, " my back pain is better" 1148 VO singulair, 0.5 Ativan IVP given. Pt is sleepy, less anxious, no flushing to face/neck, no complaints of back pain. Eyes closed. VO from MD- Rechallenge after completion of IV Fluids. 90 MD returned to rechallenge pt. VSS, pt resting with eyes closed.

## 2016-08-31 NOTE — Patient Instructions (Signed)
[facility] Discharge Instructions for Patients Receiving Chemotherapy  Today you received the following chemotherapy agents Rituxan  To help prevent nausea and vomiting after your treatment, we encourage you to take your nausea medication as directed.  If you develop nausea and vomiting that is not controlled by your nausea medication, call the clinic.   BELOW ARE SYMPTOMS THAT SHOULD BE REPORTED IMMEDIATELY:  *FEVER GREATER THAN 100.5 F  *CHILLS WITH OR WITHOUT FEVER  NAUSEA AND VOMITING THAT IS NOT CONTROLLED WITH YOUR NAUSEA MEDICATION  *UNUSUAL SHORTNESS OF BREATH  *UNUSUAL BRUISING OR BLEEDING  TENDERNESS IN MOUTH AND THROAT WITH OR WITHOUT PRESENCE OF ULCERS  *URINARY PROBLEMS  *BOWEL PROBLEMS  UNUSUAL RASH Items with * indicate a potential emergency and should be followed up as soon as possible.  Feel free to call the clinic you have any questions or concerns. The clinic phone number is [phone].  Please show the CHEMO ALERT CARD at check-in to the Emergency Department and triage nurse.      Rituximab injection What is this medicine? RITUXIMAB (ri TUX i mab) is a monoclonal antibody. It is used to treat certain types of cancer like non-Hodgkin lymphoma and chronic lymphocytic leukemia. It is also used to treat rheumatoid arthritis, granulomatosis with polyangiitis (or Wegener's granulomatosis), and microscopic polyangiitis. This medicine may be used for other purposes; ask your health care provider or pharmacist if you have questions. COMMON BRAND NAME(S): Rituxan What should I tell my health care provider before I take this medicine? They need to know if you have any of these conditions: -heart disease -infection (especially a virus infection such as hepatitis B, chickenpox, cold sores, or herpes) -immune system problems -irregular heartbeat -kidney disease -lung or breathing disease, like asthma -recently received or scheduled to  receive a vaccine -an unusual or allergic reaction to rituximab, mouse proteins, other medicines, foods, dyes, or preservatives -pregnant or trying to get pregnant -breast-feeding How should I use this medicine? This medicine is for infusion into a vein. It is administered in a hospital or clinic by a specially trained health care professional. A special MedGuide will be given to you by the pharmacist with each prescription and refill. Be sure to read this information carefully each time. Talk to your pediatrician regarding the use of this medicine in children. This medicine is not approved for use in children. Overdosage: If you think you have taken too much of this medicine contact a poison control center or emergency room at once. NOTE: This medicine is only for you. Do not share this medicine with others. What if I miss a dose? It is important not to miss a dose. Call your doctor or health care professional if you are unable to keep an appointment. What may interact with this medicine? -cisplatin -other medicines for arthritis like disease modifying antirheumatic drugs or tumor necrosis factor inhibitors -live virus vaccines This list may not describe all possible interactions. Give your health care provider a list of all the medicines, herbs, non-prescription drugs, or dietary supplements you use. Also tell them if you smoke, drink alcohol, or use illegal drugs. Some items may interact with your medicine. What should I watch for while using this medicine? Your condition will be monitored carefully while you are receiving this medicine. You may need blood work done while you are taking this medicine. This medicine can cause serious allergic reactions. To reduce your risk you may need to take medicine before treatment with this medicine. Take   medicine as directed. In some patients, this medicine may cause a serious brain infection that may cause death. If you have any problems seeing, thinking,  speaking, walking, or standing, tell your doctor right away. If you cannot reach your doctor, urgently seek other source of medical care. Call your doctor or health care professional for advice if you get a fever, chills or sore throat, or other symptoms of a cold or flu. Do not treat yourself. This drug decreases your body's ability to fight infections. Try to avoid being around people who are sick. Do not become pregnant while taking this medicine or for 12 months after stopping it. Women should inform their doctor if they wish to become pregnant or think they might be pregnant. There is a potential for serious side effects to an unborn child. Talk to your health care professional or pharmacist for more information. What side effects may I notice from receiving this medicine? Side effects that you should report to your doctor or health care professional as soon as possible: -breathing problems -chest pain -dizziness or feeling faint -fast, irregular heartbeat -low blood counts - this medicine may decrease the number of white blood cells, red blood cells and platelets. You may be at increased risk for infections and bleeding. -mouth sores -redness, blistering, peeling or loosening of the skin, including inside the mouth (this can be added for any serious or exfoliative rash that could lead to hospitalization) -signs of infection - fever or chills, cough, sore throat, pain or difficulty passing urine -signs and symptoms of kidney injury like trouble passing urine or change in the amount of urine -signs and symptoms of liver injury like dark yellow or brown urine; general ill feeling or flu-like symptoms; light-colored stools; loss of appetite; nausea; right upper belly pain; unusually weak or tired; yellowing of the eyes or skin -stomach pain -vomiting Side effects that usually do not require medical attention (report to your doctor or health care professional if they continue or are  bothersome): -headache -joint pain -muscle cramps or muscle pain This list may not describe all possible side effects. Call your doctor for medical advice about side effects. You may report side effects to FDA at 1-800-FDA-1088. Where should I keep my medicine? This drug is given in a hospital or clinic and will not be stored at home. NOTE: This sheet is a summary. It may not cover all possible information. If you have questions about this medicine, talk to your doctor, pharmacist, or health care provider.  2018 Elsevier/Gold Standard (2015-08-17 15:28:09)

## 2016-09-01 LAB — PHOSPHORUS: PHOSPHORUS: 3.5 mg/dL (ref 2.5–4.5)

## 2016-09-02 NOTE — Progress Notes (Signed)
[name]    HEMATOLOGY/ONCOLOGY CLINIC NOTE  Date of Service: .08/23/2016  Patient Care Team: Pa, Grandin as PCP - General (Family Medicine)  PCP: [name] MD   CHIEF COMPLAINTS/PURPOSE OF CONSULTATION:  Newly diagnosed splenic marginal zone lymphoma  HISTORY OF PRESENTING ILLNESS:   Wendy Avery is a wonderful 61 y.o. female who has been referred to [place] by Dr .[facility]  for evaluation and management of possible lymphoma in the setting of massive splenomegaly and abdominal lymphadenopathy.  Patient patient has no significant chronic medical issues. She presented to urgent care with abdominal bloating and distention for 1-2 months. Also had some abdominal discomfort in the left upper quadrant of the abdomen and notes that she could feel a hard mass. She also notes some aches in her chest for 1-2 months and significant progressive fatigue which is starting to affect her quality of life. Over the last month she also notes some drenching night sweats but isn't sure if this is related to menopausal symptoms. Denies any overt weight loss. Patient has history of anxiety and notes severe anxiety over the symptoms.  Patient had a CT of the abdomen and pelvis with contrast on 08/03/2016 to further evaluate her symptoms. This showed massive splenomegaly with the spleen measuring 23.0 x 20.2 x 11.6 cm (volume = 2820 cm^3). Also noted to have concurrent  mild abdominal adenopathy. Incidental findings of Indeterminate left renal lesion. Most likely a minimally complex cyst. A solid neoplasm cannot be excluded.  Patient  notes that she wants to figure out the diagnosis ASAP and is very anxious. Reports no history of alcohol abuse or liver disease.  She has them out of colonoscopy but has had fecal occult blood testing recently which was negative. Last mammogram April 2017 with the normal limits. Last Pap smear August 2017 was within normal  limits.  INTERVAL HISTORY  Patient is here for her scheduled follow-up to discuss the results of her PET CT scan and bone marrow biopsy. PET CT scan done on 08/15/2016 shows marked splenomegaly with diffuse hypermetabolic activity. Imaging and BM biopsy are consistent with diagnosis of splenic marginal zone lymphoma. We discussed the diagnosis, natural history, prognosis, treatment rationale and treatment options in details. Patient has significant issues with anxiety which were addressed as well.  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 Marital status: Widowed    Spouse name: N/A  . Number of children: N/A  . Years of education: N/A   Occupational History  . Not on file.   Social History Main Topics  . Smoking status: Former Smoker    Quit date: 2010  . Smokeless tobacco: Not on file  . Alcohol use Not on file     Comment: occassional   . Drug use: Unknown  . Sexual activity: Not on file   Other Topics Concern  . Not on file   Social History Narrative  . No narrative on file  Ex-smoker 1 pack per day quit 30 years ago Uses alcohol socially She is widowed and has 2 daughters   FAMILY HISTORY: Mom had breast cancer at age 54 years and died from metastatic disease Father skin cancer, vocal cord  cancer, alcohol abuse  ALLERGIES:  ?PCN  MEDICATIONS:  Current Outpatient Prescriptions  Medication Sig Dispense Refill  . allopurinol (ZYLOPRIM) 100 MG tablet Take 1 tablet (100 mg total) by mouth 2 (two) times daily. 40 tablet 0  . dexamethasone (DECADRON) 2 MG tablet Take 2 tablets (4 mg total) by mouth daily with breakfast. 30 tablet 0  . ibuprofen (ADVIL,MOTRIN) 200 MG tablet  Take 200 mg by mouth every 6 (six) hours as needed.     No current facility-administered medications for this visit.     REVIEW OF SYSTEMS:    10 Point review of Systems was done is negative except as noted above.  PHYSICAL EXAMINATION: ECOG PERFORMANCE STATUS: 1 - Symptomatic but completely ambulatory  . Vitals:   08/23/16 1509  BP: (!) 130/46  Pulse: 64  Resp: 18  Temp: 98.7 F (37.1 C)  SpO2: 100%   Filed Weights   08/23/16 1509  Weight: 194 lb 4.8 oz (88.1 kg)   .Body mass index is 37.95 kg/m.  GENERAL:alert, in no acute distress and comfortable SKIN: no acute rashes, no significant lesions EYES: conjunctiva are pink and non-injected, sclera anicteric OROPHARYNX: MMM, no exudates, no oropharyngeal erythema or ulceration NECK: supple, no JVD LYMPH:  no palpable lymphadenopathy in the cervical, axillary or inguinal regions LUNGS: clear to auscultation b/l with normal respiratory effort HEART: regular rate & rhythm ABDOMEN:  normoactive bowel sounds , non tender, not distended. Massive splenomegaly palpable 10-12 cm below left costal margin in midclavicular line. Extremity: no pedal edema PSYCH: alert & oriented x 3 with fluent speech NEURO: no focal motor/sensory deficits  LABORATORY DATA:  I have reviewed the data as listed  . CBC Latest Ref Rng & Units 08/13/2016 08/10/2016  WBC 3.9 - 10.3 10e3/uL 5.4 5.0  Hemoglobin 11.6 - 15.9 g/dL 12.2 12.3  Hematocrit 34.8 - 46.6 % 37.1 38.5  Platelets 145 - 400 10e3/uL 105(L) 112 few large plts seen(L)    . CMP Latest Ref Rng & Units 08/10/2016 08/10/2016  Glucose 70 - 140 mg/dl 84 -  BUN 7.0 - 26.0 mg/dL 15.4 -  Creatinine 0.6 - 1.1 mg/dL 0.8 -  Sodium 136 - 145 mEq/L 142 -  Potassium 3.5 - 5.1 mEq/L 4.2 -  CO2 22 - 29 mEq/L 27 -  Calcium 8.4 - 10.4 mg/dL 9.5 -  Total Protein 6.4 - 8.3 g/dL 6.6 6.4  Total Bilirubin 0.20 - 1.20 mg/dL 0.80 -  Alkaline Phos 40 - 150 U/L 214(H) -  AST 5 - 34 U/L 30 -  ALT 0 - 55 U/L  37 -       Component     Latest Ref Rng & Units 08/10/2016  Sed Rate     0 - 40 mm/hr 12  LDH     125 - 245 U/L 178  EBV VCA IgM     0.0 - 35.9 U/mL <36.0  CMV IgM Ser EIA-aCnc     0.0 - 29.9 AU/mL <30.0  HIV Screen 4th Generation wRfx     Non Reactive Non Reactive  Hep C Virus Ab     0.0 - 0.9 s/co ratio <0.1     RADIOGRAPHIC STUDIES: I have personally reviewed the radiological images as listed and agreed with the findings in the report. Nm Pet Image Initial (pi) Skull Base To Thigh  Result Date: 08/15/2016 CLINICAL DATA:  Initial treatment strategy for abdominal lymphadenopathy and splenomegaly. EXAM: NUCLEAR MEDICINE PET SKULL BASE TO THIGH TECHNIQUE: 9.5  mCi F-18 FDG was injected intravenously. Full-ring PET imaging was performed from the skull base to thigh after the radiotracer. CT data was obtained and used for attenuation correction and anatomic localization. FASTING BLOOD GLUCOSE:  Value: 92 mg/dl COMPARISON:  AP CT on 08/03/2016 FINDINGS: NECK No hypermetabolic lymph nodes in the neck. CHEST No hypermetabolic mediastinal or hilar nodes. No suspicious pulmonary nodules on the CT scan. ABDOMEN/PELVIS No abnormal hypermetabolic activity within the liver, pancreas, and adrenal glands. Marked splenomegaly is again seen with mild diffuse hypermetabolic activity with SUV max measuring 5.2. No focal hypermetabolic splenic masses identified. Mild retroperitoneal lymphadenopathy in the left paraaortic and aortocaval spaces. Largest lymph node measures 13 mm on image 111/4, with SUV max of 4.5. No hypermetabolic lymphadenopathy within pelvis. Aortic atherosclerosis. No significant change in small uterine fibroids, largest measuring approximately 3.5 cm. SKELETON No focal hypermetabolic activity to suggest skeletal metastasis. IMPRESSION: Marked splenomegaly with mild diffuse hypermetabolic activity. No focal hypermetabolic splenic lesions identified. Hypermetabolic mild abdominal  retroperitoneal lymphadenopathy. No hypermetabolic lymphadenopathy or masses within the neck, chest, or pelvis. Electronically Signed   By: Earle Gell M.D.   On: 08/15/2016 10:58   Ct Biopsy  Result Date: 08/13/2016 INDICATION: 61 year old female with splenomegaly EXAM: CT BONE MARROW BIOPSY AND ASPIRATION; CT BIOPSY MEDICATIONS: None. ANESTHESIA/SEDATION: Moderate (conscious) sedation was employed during this procedure. A total of Versed 2.0 mg and Fentanyl 100 mcg was administered intravenously. Moderate Sedation Time: 10 minutes. The patient's level of consciousness and vital signs were monitored continuously by radiology nursing throughout the procedure under my direct supervision. FLUOROSCOPY TIME:  CT COMPLICATIONS: None PROCEDURE: The procedure risks, benefits, and alternatives were explained to the patient. Questions regarding the procedure were encouraged and answered. The patient understands and consents to the procedure. Scout CT of the pelvis was performed for surgical planning purposes. The posterior pelvis was prepped with chlorhexidinein a sterile fashion, and a sterile drape was applied covering the operative field. A sterile gown and sterile gloves were used for the procedure. Local anesthesia was provided with 1% Lidocaine. We targeted the right posterior iliac bone for biopsy. The skin and subcutaneous tissues were infiltrated with 1% lidocaine without epinephrine. A small stab incision was made with an 11 blade scalpel, and an 11 gauge Murphy needle was advanced with CT guidance to the posterior cortex. Manual forced was used to advance the needle through the posterior cortex and the stylet was removed. A bone marrow aspirate was retrieved and passed to a cytotechnologist in the room. The Murphy needle was then advanced without the stylet for a core biopsy. The core biopsy was retrieved and also passed to a cytotechnologist. Manual pressure was used for hemostasis and a sterile dressing was  placed. No complications were encountered no significant blood loss was encountered. Patient tolerated the procedure well and remained hemodynamically stable throughout. IMPRESSION: Status post CT-guided bone marrow biopsy, with tissue specimen sent to pathology for complete histopathologic analysis Signed, Dulcy Fanny. Earleen Newport, DO [facility] Radiology Electronically Signed   By: Corrie Mckusick D.O.   On: 08/13/2016 12:30   Ct Bone Marrow Biopsy & Aspiration  Result Date: 08/13/2016 INDICATION: 61 year old female with splenomegaly EXAM: CT BONE MARROW BIOPSY AND ASPIRATION; CT BIOPSY MEDICATIONS: None. ANESTHESIA/SEDATION: Moderate (conscious) sedation was employed during this procedure. A total of Versed 2.0 mg and Fentanyl 100 mcg was administered intravenously. Moderate Sedation Time: 10 minutes. The patient's level of consciousness and vital signs were monitored continuously by radiology  nursing throughout the procedure under my direct supervision. FLUOROSCOPY TIME:  CT COMPLICATIONS: None PROCEDURE: The procedure risks, benefits, and alternatives were explained to the patient. Questions regarding the procedure were encouraged and answered. The patient understands and consents to the procedure. Scout CT of the pelvis was performed for surgical planning purposes. The posterior pelvis was prepped with chlorhexidinein a sterile fashion, and a sterile drape was applied covering the operative field. A sterile gown and sterile gloves were used for the procedure. Local anesthesia was provided with 1% Lidocaine. We targeted the right posterior iliac bone for biopsy. The skin and subcutaneous tissues were infiltrated with 1% lidocaine without epinephrine. A small stab incision was made with an 11 blade scalpel, and an 11 gauge Murphy needle was advanced with CT guidance to the posterior cortex. Manual forced was used to advance the needle through the posterior cortex and  the stylet was removed. A bone marrow aspirate was retrieved and passed to a cytotechnologist in the room. The Murphy needle was then advanced without the stylet for a core biopsy. The core biopsy was retrieved and also passed to a cytotechnologist. Manual pressure was used for hemostasis and a sterile dressing was placed. No complications were encountered no significant blood loss was encountered. Patient tolerated the procedure well and remained hemodynamically stable throughout. IMPRESSION: Status post CT-guided bone marrow biopsy, with tissue specimen sent to pathology for complete histopathologic analysis Signed, Dulcy Fanny. Earleen Newport, DO [facility] Radiology Electronically Signed   By: Corrie Mckusick D.O.   On: 08/13/2016 12:30    ASSESSMENT & PLAN:   61 year old Caucasian female with  #1 Newly diagnosed Stage IV Splenic Marginal Zone lymphoma  Presenting with Massive splenomegaly with the spleen measuring 23.0 x 20.2 x 11.6 cm (volume = 2820 cm^3). Diffusely hypermetabolic on PET/CT No evidence of liver disease on CT scan. No evidence of portal hypertension. MPN w/u demonstrated no evidence of genetics mutations to suggest MPN  #2 Mild intra-abdominal lymphadenopathy.  LDH is within normal limits and reduces the likelihood of this being a more aggressive lymphoproliferative process.  #3 Mild thrombocytopenia platelets of 105k. Likely due to the S[place]is and from hypersplenism in the setting of massive splenomegaly.  #4 Indetermine left renal lesion 1.5 cms in size ?complex cyst - will need monitoring.  PLAN -I spent a significant amount of time allaying the patients significant anxiety. -we reviewed the PET/CT images and results as well as the results of the bone marrow biopsy and other lab tests. -New diagnosis of splenic marginal zone lymphoma, its prognosis, natural history, treatment rationale and treatment options were discussed in details  with the patient and her multiple questions were answered in details -After detailed discussion she is agreeable to proceed with single agent Rituxan treatment. Weekly doses for a total of 4 doses. -Hep B serology testing -We shall start her on dexamethasone 4 mg by mouth daily to help with her left upper quadrant abdominal discomfort from splenomegaly. -She was also started on allopurinol to reduce the risk of tumor lysis syndrome.  Please schedule for Rituxan weekly x 4 doses for Splenic marginal zone lymphoma Chemo-counseling for Rituxan RTC with Dr Irene Limbo with 2nd weekly dose of Rituxan for toxicity check with labs Weekly labs with Rituxan   Orders Placed This Encounter  Procedures  . CBC & Diff and Retic    Standing Status:   Future    Number of Occurrences:   1    Standing Expiration Date:  08/23/2017  . Comprehensive metabolic panel    Standing Status:   Future    Number of Occurrences:   1    Standing Expiration Date:   08/23/2017  . Uric acid    Standing Status:   Future    Number of Occurrences:   1    Standing Expiration Date:   08/23/2017  . Phosphorus    Standing Status:   Future    Number of Occurrences:   1    Standing Expiration Date:   08/23/2017     All of the patients questions were answered with apparent satisfaction. The patient knows to call the clinic with any problems, questions or concerns.  I spent 30 minutes counseling the patient face to face. The total time spent in the appointment was 40 minutes and more than 50% was on counseling and direct patient cares.    Sullivan Lone MD [place] AAHIVMS [facility] Hematology/Oncology Physician [facility]  (Office):       [phone] (Work cell):  [phone] (Fax):           [phone]

## 2016-09-03 ENCOUNTER — Telehealth: Payer: Self-pay | Admitting: Hematology

## 2016-09-03 LAB — CHROMOSOME ANALYSIS, BONE MARROW

## 2016-09-03 NOTE — Telephone Encounter (Signed)
Scheduled appt per 8/8  sch message - patient is aware of appt time change.

## 2016-09-07 ENCOUNTER — Ambulatory Visit (HOSPITAL_BASED_OUTPATIENT_CLINIC_OR_DEPARTMENT_OTHER): Payer: BLUE CROSS/BLUE SHIELD

## 2016-09-07 ENCOUNTER — Other Ambulatory Visit (HOSPITAL_BASED_OUTPATIENT_CLINIC_OR_DEPARTMENT_OTHER): Payer: BLUE CROSS/BLUE SHIELD

## 2016-09-07 ENCOUNTER — Telehealth: Payer: Self-pay

## 2016-09-07 ENCOUNTER — Other Ambulatory Visit: Payer: BLUE CROSS/BLUE SHIELD

## 2016-09-07 ENCOUNTER — Ambulatory Visit (HOSPITAL_BASED_OUTPATIENT_CLINIC_OR_DEPARTMENT_OTHER): Payer: BLUE CROSS/BLUE SHIELD | Admitting: Hematology

## 2016-09-07 ENCOUNTER — Telehealth: Payer: Self-pay | Admitting: Hematology

## 2016-09-07 ENCOUNTER — Encounter: Payer: Self-pay | Admitting: Hematology

## 2016-09-07 VITALS — BP 119/54 | HR 61 | Temp 97.6°F | Resp 16

## 2016-09-07 VITALS — BP 124/55 | HR 61 | Temp 98.5°F | Resp 20 | Ht 60.0 in | Wt 193.4 lb

## 2016-09-07 DIAGNOSIS — F418 Other specified anxiety disorders: Secondary | ICD-10-CM

## 2016-09-07 DIAGNOSIS — R161 Splenomegaly, not elsewhere classified: Secondary | ICD-10-CM

## 2016-09-07 DIAGNOSIS — C8307 Small cell B-cell lymphoma, spleen: Secondary | ICD-10-CM

## 2016-09-07 DIAGNOSIS — N289 Disorder of kidney and ureter, unspecified: Secondary | ICD-10-CM

## 2016-09-07 DIAGNOSIS — R591 Generalized enlarged lymph nodes: Secondary | ICD-10-CM | POA: Diagnosis not present

## 2016-09-07 DIAGNOSIS — D696 Thrombocytopenia, unspecified: Secondary | ICD-10-CM

## 2016-09-07 DIAGNOSIS — Z5112 Encounter for antineoplastic immunotherapy: Secondary | ICD-10-CM

## 2016-09-07 LAB — CBC & DIFF AND RETIC
BASO%: 0.1 % (ref 0.0–2.0)
BASOS ABS: 0 10*3/uL (ref 0.0–0.1)
EOS ABS: 0.1 10*3/uL (ref 0.0–0.5)
EOS%: 0.7 % (ref 0.0–7.0)
HCT: 41.6 % (ref 34.8–46.6)
HEMOGLOBIN: 13.6 g/dL (ref 11.6–15.9)
IMMATURE RETIC FRACT: 0.7 % — AB (ref 1.60–10.00)
LYMPH%: 15.2 % (ref 14.0–49.7)
MCH: 26.5 pg (ref 25.1–34.0)
MCHC: 32.7 g/dL (ref 31.5–36.0)
MCV: 81.1 fL (ref 79.5–101.0)
MONO#: 0.8 10*3/uL (ref 0.1–0.9)
MONO%: 7 % (ref 0.0–14.0)
NEUT%: 77 % — AB (ref 38.4–76.8)
NEUTROS ABS: 8.3 10*3/uL — AB (ref 1.5–6.5)
Platelets: 139 10*3/uL — ABNORMAL LOW (ref 145–400)
RBC: 5.13 10*6/uL (ref 3.70–5.45)
RDW: 15.4 % — ABNORMAL HIGH (ref 11.2–14.5)
Retic %: 0.94 % (ref 0.70–2.10)
Retic Ct Abs: 48.22 10*3/uL (ref 33.70–90.70)
WBC: 10.7 10*3/uL — ABNORMAL HIGH (ref 3.9–10.3)
lymph#: 1.6 10*3/uL (ref 0.9–3.3)

## 2016-09-07 LAB — COMPREHENSIVE METABOLIC PANEL
ALBUMIN: 3.8 g/dL (ref 3.5–5.0)
ALT: 49 U/L (ref 0–55)
AST: 19 U/L (ref 5–34)
Alkaline Phosphatase: 89 U/L (ref 40–150)
Anion Gap: 7 mEq/L (ref 3–11)
BUN: 22.5 mg/dL (ref 7.0–26.0)
CO2: 27 mEq/L (ref 22–29)
Calcium: 9.5 mg/dL (ref 8.4–10.4)
Chloride: 104 mEq/L (ref 98–109)
Creatinine: 0.7 mg/dL (ref 0.6–1.1)
GLUCOSE: 137 mg/dL (ref 70–140)
Potassium: 4.2 mEq/L (ref 3.5–5.1)
SODIUM: 137 meq/L (ref 136–145)
TOTAL PROTEIN: 6.3 g/dL — AB (ref 6.4–8.3)
Total Bilirubin: 0.75 mg/dL (ref 0.20–1.20)

## 2016-09-07 LAB — URIC ACID: URIC ACID, SERUM: 4.3 mg/dL (ref 2.6–7.4)

## 2016-09-07 MED ORDER — SODIUM CHLORIDE 0.9 % IV SOLN
Freq: Once | INTRAVENOUS | Status: AC
  Administered 2016-09-07: 13:00:00 via INTRAVENOUS

## 2016-09-07 MED ORDER — DEXAMETHASONE SODIUM PHOSPHATE 10 MG/ML IJ SOLN
10.0000 mg | Freq: Once | INTRAMUSCULAR | Status: AC
  Administered 2016-09-07: 10 mg via INTRAVENOUS

## 2016-09-07 MED ORDER — ACETAMINOPHEN 325 MG PO TABS
ORAL_TABLET | ORAL | Status: AC
  Filled 2016-09-07: qty 2

## 2016-09-07 MED ORDER — SODIUM CHLORIDE 0.9 % IV SOLN
375.0000 mg/m2 | Freq: Once | INTRAVENOUS | Status: AC
  Administered 2016-09-07: 700 mg via INTRAVENOUS
  Filled 2016-09-07: qty 50

## 2016-09-07 MED ORDER — DIPHENHYDRAMINE HCL 25 MG PO CAPS
50.0000 mg | ORAL_CAPSULE | Freq: Once | ORAL | Status: AC
  Administered 2016-09-07: 50 mg via ORAL

## 2016-09-07 MED ORDER — DIPHENHYDRAMINE HCL 25 MG PO CAPS
ORAL_CAPSULE | ORAL | Status: AC
  Filled 2016-09-07: qty 2

## 2016-09-07 MED ORDER — LORAZEPAM 2 MG/ML IJ SOLN
0.5000 mg | Freq: Once | INTRAMUSCULAR | Status: AC
  Administered 2016-09-07: 0.5 mg via INTRAVENOUS

## 2016-09-07 MED ORDER — DEXAMETHASONE SODIUM PHOSPHATE 10 MG/ML IJ SOLN
INTRAMUSCULAR | Status: AC
  Filled 2016-09-07: qty 1

## 2016-09-07 MED ORDER — LORAZEPAM 2 MG/ML IJ SOLN
INTRAMUSCULAR | Status: AC
  Filled 2016-09-07: qty 1

## 2016-09-07 MED ORDER — ACETAMINOPHEN 325 MG PO TABS
650.0000 mg | ORAL_TABLET | Freq: Once | ORAL | Status: AC
  Administered 2016-09-07: 650 mg via ORAL

## 2016-09-07 NOTE — Telephone Encounter (Signed)
MD would like to add CBC, CMET, uric acid to today's labs. Extra tubes drawn - okay to add.

## 2016-09-07 NOTE — Telephone Encounter (Signed)
Scheduled appt per 8/17 los - patient to get new schedule next visit.

## 2016-09-07 NOTE — Patient Instructions (Signed)
[facility] Discharge Instructions for Patients Receiving Chemotherapy  Today you received the following chemotherapy agents: Rituxan   To help prevent nausea and vomiting after your treatment, we encourage you to take your nausea medication as directed.    If you develop nausea and vomiting that is not controlled by your nausea medication, call the clinic.   BELOW ARE SYMPTOMS THAT SHOULD BE REPORTED IMMEDIATELY:  *FEVER GREATER THAN 100.5 F  *CHILLS WITH OR WITHOUT FEVER  NAUSEA AND VOMITING THAT IS NOT CONTROLLED WITH YOUR NAUSEA MEDICATION  *UNUSUAL SHORTNESS OF BREATH  *UNUSUAL BRUISING OR BLEEDING  TENDERNESS IN MOUTH AND THROAT WITH OR WITHOUT PRESENCE OF ULCERS  *URINARY PROBLEMS  *BOWEL PROBLEMS  UNUSUAL RASH Items with * indicate a potential emergency and should be followed up as soon as possible.  Feel free to call the clinic you have any questions or concerns. The clinic phone number is [phone].  Please show the CHEMO ALERT CARD at check-in to the Emergency Department and triage nurse.   

## 2016-09-07 NOTE — Patient Instructions (Signed)
Thank you for choosing [facility] to provide your oncology and hematology care.  To afford each patient quality time with our providers, please arrive 30 minutes before your scheduled appointment time.  If you arrive late for your appointment, you may be asked to reschedule.  We strive to give you quality time with our providers, and arriving late affects you and other patients whose appointments are after yours.  If you are a no show for multiple scheduled visits, you may be dismissed from the clinic at the providers discretion.   Again, thank you for choosing [facility], our hope is that these requests will decrease the amount of time that you wait before being seen by our physicians.  ______________________________________________________________________ Should you have questions after your visit to the [facility], please contact our office at [phone] between the hours of 8:30 and 4:30 p.m.    Voicemails left after 4:30p.m will not be returned until the following business day.   For prescription refill requests, please have your pharmacy contact us directly.  Please also try to allow 48 hours for prescription requests.   Please contact the scheduling department for questions regarding scheduling.  For scheduling of procedures such as PET scans, CT scans, MRI, Ultrasound, etc please contact central scheduling at [phone].   Resources For Cancer Patients and Caregivers:  American Cancer Society:  800-227-2345  Can help patients locate various types of support and financial assistance Cancer Care: 1-800-813-HOPE (4673) Provides financial assistance, online support groups, medication/co-pay assistance.   [place] DSS:  336-641-3447 Where to apply for food stamps, Medicaid, and utility assistance Medicare Rights Center: 800-333-4114 Helps people with Medicare understand their rights and benefits, navigate the Medicare system, and secure the  quality healthcare they deserve SCAT: 336-333-6589 [place] [employer]'s shared-ride transportation service for eligible riders who have a disability that prevents them from riding the fixed route bus.   For additional information on assistance programs please contact our social worker:   Grier Hock/Abigail Elmore:  [phone] 

## 2016-09-08 LAB — HEPATITIS B CORE ANTIBODY, TOTAL: HEP B C TOTAL AB: NEGATIVE

## 2016-09-08 LAB — HEPATITIS B SURFACE ANTIGEN: HEP B S AG: NEGATIVE

## 2016-09-08 NOTE — Progress Notes (Signed)
[name]    HEMATOLOGY/ONCOLOGY CLINIC NOTE  Date of Service: .09/07/2016  Patient Care Team: [place], [place] as PCP - General (Family Medicine)  PCP: [name] MD   CHIEF COMPLAINTS/PURPOSE OF CONSULTATION:  F/U for Splenic marginal zone lymphoma  HISTORY OF PRESENTING ILLNESS:   Wendy Avery is a wonderful 61 y.o. female who has been referred to [place] by Dr [facility]  for evaluation and management of possible lymphoma in the setting of massive splenomegaly and abdominal lymphadenopathy.  Patient patient has no significant chronic medical issues. She presented to urgent care with abdominal bloating and distention for 1-2 months. Also had some abdominal discomfort in the left upper quadrant of the abdomen and notes that she could feel a hard mass. She also notes some aches in her chest for 1-2 months and significant progressive fatigue which is starting to affect her quality of life. Over the last month she also notes some drenching night sweats but isn't sure if this is related to menopausal symptoms. Denies any overt weight loss. Patient has history of anxiety and notes severe anxiety over the symptoms.  Patient had a CT of the abdomen and pelvis with contrast on 08/03/2016 to further evaluate her symptoms. This showed massive splenomegaly with the spleen measuring 23.0 x 20.2 x 11.6 cm (volume = 2820 cm^3). Also noted to have concurrent  mild abdominal adenopathy. Incidental findings of Indeterminate left renal lesion. Most likely a minimally complex cyst. A solid neoplasm cannot be excluded.  Patient  notes that she wants to figure out the diagnosis ASAP and is very anxious. Reports no history of alcohol abuse or liver disease.  She has them out of colonoscopy but has had fecal occult blood testing recently which was negative. Last mammogram April 2017 with the normal limits. Last Pap smear August 2017 was within normal limits.  INTERVAL  HISTORY  Patient is here for her scheduled follow-up prior to her 2nd dose of Rituxan for her SMZL lymphoma for a toxicity check with labs. No evidence TLS on labs. She has had issues with severe anxiety and near panic attacks and needed ativan to calm her down during the prior Rituxan infusion. She notes "knock me out with medications" prior to this infusion of Rituxan as well. Given some ativan to help with anxiety. Patient had a page full of questions regarding her lymphoma and its treatment and I answered them in detail to her apparentsatisfaction. No fevers/chills/nightsweats. LUQ  abdominal discomfort appears to have decreased some.  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 Marital status: Widowed    Spouse name: N/A  . Number of children: N/A  . Years of education: N/A   Occupational History  . Not on file.   Social History Main Topics  . Smoking status: Former Smoker    Quit date: 2010  . Smokeless tobacco: Never Used  . Alcohol use Yes     Comment: occassional   . Drug use: No  . Sexual activity: Not on file   Other Topics Concern  . Not on file   Social History Narrative  . No narrative on file  Ex-smoker 1 pack per day quit 30  years ago Uses alcohol socially She is widowed and has 2 daughters   FAMILY HISTORY: Mom had breast cancer at age 79 years and died from metastatic disease Father skin cancer, vocal cord cancer, alcohol abuse  ALLERGIES:  ?PCN  MEDICATIONS:  Current Outpatient Prescriptions  Medication Sig Dispense Refill  . allopurinol (ZYLOPRIM) 100 MG tablet Take 1 tablet (100 mg total) by mouth 2 (two) times daily. 40 tablet 0  . dexamethasone  (DECADRON) 2 MG tablet Take 2 tablets (4 mg total) by mouth daily with breakfast. 30 tablet 0  . ibuprofen (ADVIL,MOTRIN) 200 MG tablet Take 200 mg by mouth every 6 (six) hours as needed.     No current facility-administered medications for this visit.     REVIEW OF SYSTEMS:    10 Point review of Systems was done is negative except as noted above.  PHYSICAL EXAMINATION: ECOG PERFORMANCE STATUS: 1 - Symptomatic but completely ambulatory  . Vitals:   09/07/16 1113  BP: (!) 124/55  Pulse: 61  Resp: 20  Temp: 98.5 F (36.9 C)  SpO2: 99%   Filed Weights   09/07/16 1113  Weight: 193 lb 6.4 oz (87.7 kg)   .Body mass index is 37.77 kg/m.  GENERAL:alert, in no acute distress and comfortable SKIN: no acute rashes, no significant lesions EYES: conjunctiva are pink and non-injected, sclera anicteric OROPHARYNX: MMM, no exudates, no oropharyngeal erythema or ulceration NECK: supple, no JVD LYMPH:  no palpable lymphadenopathy in the cervical, axillary or inguinal regions LUNGS: clear to auscultation b/l with normal respiratory effort HEART: regular rate & rhythm ABDOMEN:  normoactive bowel sounds , non tender, not distended. Massive splenomegaly palpable 10-12 cm below left costal margin in midclavicular line. Extremity: no pedal edema PSYCH: alert & oriented x 3 with fluent speech NEURO: no focal motor/sensory deficits  LABORATORY DATA:  I have reviewed the data as listed  . CBC Latest Ref Rng & Units 09/07/2016 08/31/2016 08/13/2016  WBC 3.9 - 10.3 10e3/uL 10.7(H) 11.1(H) 5.4  Hemoglobin 11.6 - 15.9 g/dL 13.6 13.5 12.2  Hematocrit 34.8 - 46.6 % 41.6 42.0 37.1  Platelets 145 - 400 10e3/uL 139(L) 147 105(L)   . CMP Latest Ref Rng & Units 09/07/2016 08/31/2016 08/10/2016  Glucose 70 - 140 mg/dl 137 163(H) 84  BUN 7.0 - 26.0 mg/dL 22.5 16.7 15.4  Creatinine 0.6 - 1.1 mg/dL 0.7 0.8 0.8  Sodium 136 - 145 mEq/L 137 140 142  Potassium 3.5 - 5.1 mEq/L 4.2 3.5 4.2  CO2 22 - 29 mEq/L  _0 Calcium 8.4 - 10.4 mg/dL 9.5 9.6 9.5  Total Protein 6.4 - 8.3 g/dL 6.3(L) 6.5 6.6  Total Bilirubin 0.20 - 1.20 mg/dL 0.75 0.67 0.80  Alkaline Phos 40 - 150 U/L 89 132 214(H)  AST 5 - 34 U/L _1 ALT 0 - 55 U/L 49 27 37    . Lab Results  Component Value Date   LDH 178 08/10/2016     ...   Component     Latest Ref Rng & Units 09/07/2016  Hep B Core Ab, Tot     Negative Negative  Hepatitis B Surface Ag     Negative Negative   Component     Latest Ref Rng & Units 08/10/2016  Sed Rate     0 - 40 mm/hr 12  LDH     125 - 245 U/L 178  EBV VCA IgM     0.0 - 35.9 U/mL <36.0  CMV IgM Ser EIA-aCnc     0.0 - 29.9 AU/mL <30.0  HIV Screen 4th Generation wRfx     Non Reactive Non Reactive  Hep C Virus Ab     0.0 - 0.9 s/co ratio <0.1     RADIOGRAPHIC STUDIES: I have personally reviewed the radiological images as listed and agreed with the findings in the report. Nm Pet Image Initial (pi) Skull Base To Thigh  Result Date: 08/15/2016 CLINICAL DATA:  Initial treatment strategy for abdominal lymphadenopathy and splenomegaly. EXAM: NUCLEAR MEDICINE PET SKULL BASE TO THIGH TECHNIQUE: 9.5 mCi F-18 FDG was injected intravenously. Full-ring PET imaging was performed from the skull base to thigh after the radiotracer. CT data was obtained and used for attenuation correction and anatomic localization. FASTING BLOOD GLUCOSE:  Value: 92 mg/dl COMPARISON:  AP CT on 08/03/2016 FINDINGS: NECK No hypermetabolic lymph nodes in the neck. CHEST No hypermetabolic mediastinal or hilar nodes. No suspicious pulmonary nodules on the CT scan. ABDOMEN/PELVIS No abnormal hypermetabolic activity within the liver, pancreas, and adrenal glands. Marked splenomegaly is again seen with mild diffuse hypermetabolic activity with SUV max measuring 5.2. No focal hypermetabolic splenic masses identified. Mild retroperitoneal lymphadenopathy in the left paraaortic and aortocaval spaces. Largest lymph node  measures 13 mm on image 111/4, with SUV max of 4.5. No hypermetabolic lymphadenopathy within pelvis. Aortic atherosclerosis. No significant change in small uterine fibroids, largest measuring approximately 3.5 cm. SKELETON No focal hypermetabolic activity to suggest skeletal metastasis. IMPRESSION: Marked splenomegaly with mild diffuse hypermetabolic activity. No focal hypermetabolic splenic lesions identified. Hypermetabolic mild abdominal retroperitoneal lymphadenopathy. No hypermetabolic lymphadenopathy or masses within the neck, chest, or pelvis. Electronically Signed   By: Earle Gell M.D.   On: 08/15/2016 10:58   Ct Biopsy  Result Date: 08/13/2016 INDICATION: 61 year old female with splenomegaly EXAM: CT BONE MARROW BIOPSY AND ASPIRATION; CT BIOPSY MEDICATIONS: None. ANESTHESIA/SEDATION: Moderate (conscious) sedation was employed during this procedure. A total of Versed 2.0 mg and Fentanyl 100 mcg was administered intravenously. Moderate Sedation Time: 10 minutes. The patient's level of consciousness and vital signs were monitored continuously by radiology nursing throughout the procedure under my direct supervision. FLUOROSCOPY TIME:  CT COMPLICATIONS: None PROCEDURE: The procedure risks, benefits, and alternatives were explained to the patient. Questions regarding the procedure were encouraged and answered. The patient understands and consents to the procedure. Scout CT of the pelvis was performed for surgical planning purposes. The posterior pelvis was prepped with chlorhexidinein a sterile fashion, and a sterile drape was applied covering the operative field. A sterile gown and sterile gloves were used for the procedure. Local anesthesia was provided with 1% Lidocaine. We targeted the right posterior iliac bone for biopsy. The skin and subcutaneous tissues were infiltrated with 1% lidocaine without epinephrine. A small stab incision was made with an 11 blade scalpel, and an 11 gauge Murphy needle was  advanced with CT guidance to the posterior cortex. Manual forced was used to advance the needle through the posterior cortex and the stylet was removed. A bone marrow aspirate was retrieved and passed to a cytotechnologist in the room. The Murphy needle was then advanced without the stylet for a core biopsy. The core biopsy was retrieved and also passed to a cytotechnologist. Manual pressure was used for hemostasis and a sterile dressing was placed. No complications were encountered no significant blood loss was encountered. Patient tolerated the procedure well and remained hemodynamically stable throughout. IMPRESSION: Status post CT-guided bone marrow biopsy, with tissue specimen sent to  pathology for complete histopathologic analysis Signed, Dulcy Fanny. Earleen Newport, DO [facility] Radiology Electronically Signed   By: Corrie Mckusick D.O.   On: 08/13/2016 12:30   Ct Bone Marrow Biopsy & Aspiration  Result Date: 08/13/2016 INDICATION: 62 year old female with splenomegaly EXAM: CT BONE MARROW BIOPSY AND ASPIRATION; CT BIOPSY MEDICATIONS: None. ANESTHESIA/SEDATION: Moderate (conscious) sedation was employed during this procedure. A total of Versed 2.0 mg and Fentanyl 100 mcg was administered intravenously. Moderate Sedation Time: 10 minutes. The patient's level of consciousness and vital signs were monitored continuously by radiology nursing throughout the procedure under my direct supervision. FLUOROSCOPY TIME:  CT COMPLICATIONS: None PROCEDURE: The procedure risks, benefits, and alternatives were explained to the patient. Questions regarding the procedure were encouraged and answered. The patient understands and consents to the procedure. Scout CT of the pelvis was performed for surgical planning purposes. The posterior pelvis was prepped with chlorhexidinein a sterile fashion, and a sterile drape was applied covering the operative field. A sterile gown and sterile gloves  were used for the procedure. Local anesthesia was provided with 1% Lidocaine. We targeted the right posterior iliac bone for biopsy. The skin and subcutaneous tissues were infiltrated with 1% lidocaine without epinephrine. A small stab incision was made with an 11 blade scalpel, and an 11 gauge Murphy needle was advanced with CT guidance to the posterior cortex. Manual forced was used to advance the needle through the posterior cortex and the stylet was removed. A bone marrow aspirate was retrieved and passed to a cytotechnologist in the room. The Murphy needle was then advanced without the stylet for a core biopsy. The core biopsy was retrieved and also passed to a cytotechnologist. Manual pressure was used for hemostasis and a sterile dressing was placed. No complications were encountered no significant blood loss was encountered. Patient tolerated the procedure well and remained hemodynamically stable throughout. IMPRESSION: Status post CT-guided bone marrow biopsy, with tissue specimen sent to pathology for complete histopathologic analysis Signed, Dulcy Fanny. Earleen Newport, DO [facility] Radiology Electronically Signed   By: Corrie Mckusick D.O.   On: 08/13/2016 12:30    ASSESSMENT & PLAN:   61 year old Caucasian female with  #1 Newly diagnosed Stage IV Splenic Marginal Zone lymphoma  Presenting with Massive splenomegaly with the spleen measuring 23.0 x 20.2 x 11.6 cm (volume = 2820 cm^3). Diffusely hypermetabolic on PET/CT No evidence of liver disease on CT scan. No evidence of portal hypertension. MPN w/u demonstrated no evidence of genetics mutations to suggest MPN  #2 Mild intra-abdominal lymphadenopathy.  #3 Mild thrombocytopenia platelets of 105k. Likely due to the [place] and from hypersplenism in the setting of massive splenomegaly. Improved to 130-140k range.  #4 Indetermine left renal lesion 1.5 cms in size ?complex cyst - will need  monitoring.  #5 Severe anxiety PLAN -patient had significant anxieties and near panic attack with first dose of Rituxan with no definitive hypersensitivity reaction. -labs today stable with no evidence of TLS. -no prohibitive toxicity from Rituxan -patient appropriate to proceed with 2nd dose of Rituxan with appropriate adjustments made for pre-medications -pre-treatment lorazepam to help with anxieities - patient requests this. -continue Allopurinol for now.  Continue weekly labs Complete course of Rituxan RTC with Dr Irene Limbo in 2 weeks with Rituxan C4  Orders Placed This Encounter  Procedures  . CBC & Diff and Retic    Standing Status:   Future    Number of Occurrences:   1  Standing Expiration Date:   09/07/2017  . Comprehensive metabolic panel    Standing Status:   Future    Number of Occurrences:   1    Standing Expiration Date:   09/07/2017  . Uric acid    Standing Status:   Future    Number of Occurrences:   1    Standing Expiration Date:   09/07/2017     All of the patients questions were answered with apparent satisfaction. The patient knows to call the clinic with any problems, questions or concerns.  I spent 20 minutes counseling the patient face to face. The total time spent in the appointment was 25 minutes and more than 50% was on counseling and direct patient cares.    Sullivan Lone MD [place] AAHIVMS [facility] Hematology/Oncology Physician [facility]  (Office):       [phone] (Work cell):  [phone] (Fax):           [phone]

## 2016-09-10 ENCOUNTER — Other Ambulatory Visit: Payer: Self-pay | Admitting: *Deleted

## 2016-09-10 ENCOUNTER — Other Ambulatory Visit: Payer: Self-pay | Admitting: Hematology

## 2016-09-10 MED ORDER — NYSTATIN 100000 UNIT/ML MT SUSP: 5.0000 mL | Freq: Four times a day (QID) | OROMUCOSAL | 0 refills | Status: DC

## 2016-09-10 MED ORDER — DEXAMETHASONE 2 MG PO TABS: 4.0000 mg | ORAL_TABLET | Freq: Every day | ORAL | 0 refills | Status: DC

## 2016-09-13 ENCOUNTER — Other Ambulatory Visit: Payer: Self-pay | Admitting: *Deleted

## 2016-09-13 DIAGNOSIS — C8307 Small cell B-cell lymphoma, spleen: Secondary | ICD-10-CM

## 2016-09-14 ENCOUNTER — Ambulatory Visit (HOSPITAL_BASED_OUTPATIENT_CLINIC_OR_DEPARTMENT_OTHER): Payer: BLUE CROSS/BLUE SHIELD

## 2016-09-14 ENCOUNTER — Other Ambulatory Visit (HOSPITAL_BASED_OUTPATIENT_CLINIC_OR_DEPARTMENT_OTHER): Payer: BLUE CROSS/BLUE SHIELD

## 2016-09-14 VITALS — BP 117/57 | HR 60 | Temp 98.3°F | Resp 16

## 2016-09-14 DIAGNOSIS — C8307 Small cell B-cell lymphoma, spleen: Secondary | ICD-10-CM | POA: Diagnosis not present

## 2016-09-14 DIAGNOSIS — Z5111 Encounter for antineoplastic chemotherapy: Secondary | ICD-10-CM

## 2016-09-14 LAB — CBC WITH DIFFERENTIAL/PLATELET
BASO%: 0.4 % (ref 0.0–2.0)
Basophils Absolute: 0 10*3/uL (ref 0.0–0.1)
EOS%: 0.4 % (ref 0.0–7.0)
Eosinophils Absolute: 0 10*3/uL (ref 0.0–0.5)
HEMATOCRIT: 43.9 % (ref 34.8–46.6)
HGB: 14.5 g/dL (ref 11.6–15.9)
LYMPH%: 16.9 % (ref 14.0–49.7)
MCH: 26.1 pg (ref 25.1–34.0)
MCHC: 33 g/dL (ref 31.5–36.0)
MCV: 79 fL — AB (ref 79.5–101.0)
MONO#: 0.5 10*3/uL (ref 0.1–0.9)
MONO%: 5.1 % (ref 0.0–14.0)
NEUT#: 8.1 10*3/uL — ABNORMAL HIGH (ref 1.5–6.5)
NEUT%: 77.2 % — AB (ref 38.4–76.8)
Platelets: 135 10*3/uL — ABNORMAL LOW (ref 145–400)
RBC: 5.56 10*6/uL — AB (ref 3.70–5.45)
RDW: 16.6 % — ABNORMAL HIGH (ref 11.2–14.5)
WBC: 10.5 10*3/uL — ABNORMAL HIGH (ref 3.9–10.3)
lymph#: 1.8 10*3/uL (ref 0.9–3.3)

## 2016-09-14 LAB — COMPREHENSIVE METABOLIC PANEL
ALBUMIN: 4 g/dL (ref 3.5–5.0)
ALK PHOS: 78 U/L (ref 40–150)
ALT: 72 U/L — AB (ref 0–55)
AST: 28 U/L (ref 5–34)
Anion Gap: 6 mEq/L (ref 3–11)
BILIRUBIN TOTAL: 1.05 mg/dL (ref 0.20–1.20)
BUN: 20.2 mg/dL (ref 7.0–26.0)
CO2: 28 meq/L (ref 22–29)
Calcium: 9.7 mg/dL (ref 8.4–10.4)
Chloride: 101 mEq/L (ref 98–109)
Creatinine: 0.8 mg/dL (ref 0.6–1.1)
EGFR: 86 mL/min/{1.73_m2} — AB (ref >90)
GLUCOSE: 101 mg/dL (ref 70–140)
Potassium: 4.4 mEq/L (ref 3.5–5.1)
SODIUM: 136 meq/L (ref 136–145)
TOTAL PROTEIN: 6.7 g/dL (ref 6.4–8.3)

## 2016-09-14 MED ORDER — DEXAMETHASONE SODIUM PHOSPHATE 10 MG/ML IJ SOLN
10.0000 mg | Freq: Once | INTRAMUSCULAR | Status: AC
  Administered 2016-09-14: 10 mg via INTRAVENOUS

## 2016-09-14 MED ORDER — ACETAMINOPHEN 325 MG PO TABS
650.0000 mg | ORAL_TABLET | Freq: Once | ORAL | Status: AC
  Administered 2016-09-14: 650 mg via ORAL

## 2016-09-14 MED ORDER — LORAZEPAM 2 MG/ML IJ SOLN
0.5000 mg | Freq: Once | INTRAMUSCULAR | Status: AC
  Administered 2016-09-14: 0.5 mg via INTRAVENOUS

## 2016-09-14 MED ORDER — DIPHENHYDRAMINE HCL 25 MG PO CAPS
ORAL_CAPSULE | ORAL | Status: AC
  Filled 2016-09-14: qty 2

## 2016-09-14 MED ORDER — LORAZEPAM 2 MG/ML IJ SOLN
INTRAMUSCULAR | Status: AC
  Filled 2016-09-14: qty 1

## 2016-09-14 MED ORDER — ACETAMINOPHEN 325 MG PO TABS
ORAL_TABLET | ORAL | Status: AC
  Filled 2016-09-14: qty 2

## 2016-09-14 MED ORDER — SODIUM CHLORIDE 0.9 % IV SOLN
375.0000 mg/m2 | Freq: Once | INTRAVENOUS | Status: AC
  Administered 2016-09-14: 700 mg via INTRAVENOUS
  Filled 2016-09-14: qty 50

## 2016-09-14 MED ORDER — SODIUM CHLORIDE 0.9 % IV SOLN
Freq: Once | INTRAVENOUS | Status: AC
  Administered 2016-09-14: 13:00:00 via INTRAVENOUS

## 2016-09-14 MED ORDER — DEXAMETHASONE SODIUM PHOSPHATE 10 MG/ML IJ SOLN
INTRAMUSCULAR | Status: AC
  Filled 2016-09-14: qty 1

## 2016-09-14 MED ORDER — DIPHENHYDRAMINE HCL 25 MG PO CAPS
50.0000 mg | ORAL_CAPSULE | Freq: Once | ORAL | Status: AC
  Administered 2016-09-14: 50 mg via ORAL

## 2016-09-14 NOTE — Patient Instructions (Signed)
[facility] Discharge Instructions for Patients Receiving Chemotherapy  Today you received the following chemotherapy agents: Rituxan   To help prevent nausea and vomiting after your treatment, we encourage you to take your nausea medication as directed.    If you develop nausea and vomiting that is not controlled by your nausea medication, call the clinic.   BELOW ARE SYMPTOMS THAT SHOULD BE REPORTED IMMEDIATELY:  *FEVER GREATER THAN 100.5 F  *CHILLS WITH OR WITHOUT FEVER  NAUSEA AND VOMITING THAT IS NOT CONTROLLED WITH YOUR NAUSEA MEDICATION  *UNUSUAL SHORTNESS OF BREATH  *UNUSUAL BRUISING OR BLEEDING  TENDERNESS IN MOUTH AND THROAT WITH OR WITHOUT PRESENCE OF ULCERS  *URINARY PROBLEMS  *BOWEL PROBLEMS  UNUSUAL RASH Items with * indicate a potential emergency and should be followed up as soon as possible.  Feel free to call the clinic you have any questions or concerns. The clinic phone number is [phone].  Please show the CHEMO ALERT CARD at check-in to the Emergency Department and triage nurse.   

## 2016-09-19 DIAGNOSIS — R87610 Atypical squamous cells of undetermined significance on cytologic smear of cervix (ASC-US): Secondary | ICD-10-CM | POA: Diagnosis not present

## 2016-09-19 DIAGNOSIS — Z1151 Encounter for screening for human papillomavirus (HPV): Secondary | ICD-10-CM | POA: Diagnosis not present

## 2016-09-19 DIAGNOSIS — Z1389 Encounter for screening for other disorder: Secondary | ICD-10-CM | POA: Diagnosis not present

## 2016-09-19 DIAGNOSIS — R102 Pelvic and perineal pain: Secondary | ICD-10-CM | POA: Diagnosis not present

## 2016-09-19 DIAGNOSIS — Z1231 Encounter for screening mammogram for malignant neoplasm of breast: Secondary | ICD-10-CM | POA: Diagnosis not present

## 2016-09-19 DIAGNOSIS — Z6837 Body mass index (BMI) 37.0-37.9, adult: Secondary | ICD-10-CM | POA: Diagnosis not present

## 2016-09-19 DIAGNOSIS — Z124 Encounter for screening for malignant neoplasm of cervix: Secondary | ICD-10-CM | POA: Diagnosis not present

## 2016-09-19 DIAGNOSIS — Z130 Encounter for screening for diseases of the blood and blood-forming organs and certain disorders involving the immune mechanism: Secondary | ICD-10-CM | POA: Diagnosis not present

## 2016-09-19 DIAGNOSIS — Z01419 Encounter for gynecological examination (general) (routine) without abnormal findings: Secondary | ICD-10-CM | POA: Diagnosis not present

## 2016-09-19 DIAGNOSIS — B372 Candidiasis of skin and nail: Secondary | ICD-10-CM | POA: Diagnosis not present

## 2016-09-21 ENCOUNTER — Telehealth: Payer: Self-pay | Admitting: Hematology

## 2016-09-21 ENCOUNTER — Other Ambulatory Visit: Payer: BLUE CROSS/BLUE SHIELD

## 2016-09-21 ENCOUNTER — Encounter (INDEPENDENT_AMBULATORY_CARE_PROVIDER_SITE_OTHER): Payer: Self-pay

## 2016-09-21 ENCOUNTER — Other Ambulatory Visit (HOSPITAL_BASED_OUTPATIENT_CLINIC_OR_DEPARTMENT_OTHER): Payer: BLUE CROSS/BLUE SHIELD

## 2016-09-21 ENCOUNTER — Ambulatory Visit (HOSPITAL_BASED_OUTPATIENT_CLINIC_OR_DEPARTMENT_OTHER): Payer: BLUE CROSS/BLUE SHIELD | Admitting: Hematology

## 2016-09-21 ENCOUNTER — Ambulatory Visit (HOSPITAL_BASED_OUTPATIENT_CLINIC_OR_DEPARTMENT_OTHER): Payer: BLUE CROSS/BLUE SHIELD

## 2016-09-21 VITALS — BP 140/56 | HR 60 | Temp 98.5°F | Resp 18 | Ht 60.0 in | Wt 194.3 lb

## 2016-09-21 VITALS — BP 137/64 | HR 67 | Temp 98.5°F | Resp 18

## 2016-09-21 DIAGNOSIS — Z5112 Encounter for antineoplastic immunotherapy: Secondary | ICD-10-CM | POA: Diagnosis not present

## 2016-09-21 DIAGNOSIS — R161 Splenomegaly, not elsewhere classified: Secondary | ICD-10-CM

## 2016-09-21 DIAGNOSIS — R591 Generalized enlarged lymph nodes: Secondary | ICD-10-CM | POA: Diagnosis not present

## 2016-09-21 DIAGNOSIS — C8307 Small cell B-cell lymphoma, spleen: Secondary | ICD-10-CM

## 2016-09-21 DIAGNOSIS — D696 Thrombocytopenia, unspecified: Secondary | ICD-10-CM | POA: Diagnosis not present

## 2016-09-21 DIAGNOSIS — N289 Disorder of kidney and ureter, unspecified: Secondary | ICD-10-CM | POA: Diagnosis not present

## 2016-09-21 DIAGNOSIS — F419 Anxiety disorder, unspecified: Secondary | ICD-10-CM | POA: Diagnosis not present

## 2016-09-21 LAB — COMPREHENSIVE METABOLIC PANEL
ALT: 82 U/L — AB (ref 0–55)
ANION GAP: 9 meq/L (ref 3–11)
AST: 33 U/L (ref 5–34)
Albumin: 3.9 g/dL (ref 3.5–5.0)
Alkaline Phosphatase: 71 U/L (ref 40–150)
BILIRUBIN TOTAL: 1.06 mg/dL (ref 0.20–1.20)
BUN: 23.4 mg/dL (ref 7.0–26.0)
CALCIUM: 9.6 mg/dL (ref 8.4–10.4)
CO2: 26 mEq/L (ref 22–29)
CREATININE: 0.8 mg/dL (ref 0.6–1.1)
Chloride: 102 mEq/L (ref 98–109)
EGFR: 86 mL/min/{1.73_m2} — ABNORMAL LOW (ref >90)
Glucose: 98 mg/dl (ref 70–140)
Potassium: 4.2 mEq/L (ref 3.5–5.1)
Sodium: 137 mEq/L (ref 136–145)
TOTAL PROTEIN: 6.4 g/dL (ref 6.4–8.3)

## 2016-09-21 LAB — LACTATE DEHYDROGENASE: LDH: 213 U/L (ref 125–245)

## 2016-09-21 LAB — CBC WITH DIFFERENTIAL/PLATELET
BASO%: 0.1 % (ref 0.0–2.0)
Basophils Absolute: 0 10*3/uL (ref 0.0–0.1)
EOS%: 0.4 % (ref 0.0–7.0)
Eosinophils Absolute: 0 10*3/uL (ref 0.0–0.5)
HEMATOCRIT: 43.9 % (ref 34.8–46.6)
HEMOGLOBIN: 14.5 g/dL (ref 11.6–15.9)
LYMPH#: 1.5 10*3/uL (ref 0.9–3.3)
LYMPH%: 15.4 % (ref 14.0–49.7)
MCH: 27 pg (ref 25.1–34.0)
MCHC: 33 g/dL (ref 31.5–36.0)
MCV: 81.6 fL (ref 79.5–101.0)
MONO#: 0.4 10*3/uL (ref 0.1–0.9)
MONO%: 4.2 % (ref 0.0–14.0)
NEUT%: 79.9 % — ABNORMAL HIGH (ref 38.4–76.8)
NEUTROS ABS: 7.6 10*3/uL — AB (ref 1.5–6.5)
PLATELETS: 107 10*3/uL — AB (ref 145–400)
RBC: 5.38 10*6/uL (ref 3.70–5.45)
RDW: 16.1 % — AB (ref 11.2–14.5)
WBC: 9.5 10*3/uL (ref 3.9–10.3)

## 2016-09-21 MED ORDER — SODIUM CHLORIDE 0.9 % IV SOLN
Freq: Once | INTRAVENOUS | Status: AC
  Administered 2016-09-21: 13:00:00 via INTRAVENOUS

## 2016-09-21 MED ORDER — LORAZEPAM 2 MG/ML IJ SOLN
0.5000 mg | Freq: Once | INTRAMUSCULAR | Status: AC
  Administered 2016-09-21: 0.5 mg via INTRAVENOUS

## 2016-09-21 MED ORDER — SODIUM CHLORIDE 0.9 % IV SOLN
375.0000 mg/m2 | Freq: Once | INTRAVENOUS | Status: AC
  Administered 2016-09-21: 700 mg via INTRAVENOUS
  Filled 2016-09-21: qty 50

## 2016-09-21 MED ORDER — DIPHENHYDRAMINE HCL 25 MG PO CAPS
ORAL_CAPSULE | ORAL | Status: AC
  Filled 2016-09-21: qty 2

## 2016-09-21 MED ORDER — ACETAMINOPHEN 325 MG PO TABS
ORAL_TABLET | ORAL | Status: AC
  Filled 2016-09-21: qty 2

## 2016-09-21 MED ORDER — DEXAMETHASONE SODIUM PHOSPHATE 10 MG/ML IJ SOLN
10.0000 mg | Freq: Once | INTRAMUSCULAR | Status: AC
  Administered 2016-09-21: 10 mg via INTRAVENOUS

## 2016-09-21 MED ORDER — ACETAMINOPHEN 325 MG PO TABS
650.0000 mg | ORAL_TABLET | Freq: Once | ORAL | Status: AC
  Administered 2016-09-21: 650 mg via ORAL

## 2016-09-21 MED ORDER — DIPHENHYDRAMINE HCL 25 MG PO CAPS
50.0000 mg | ORAL_CAPSULE | Freq: Once | ORAL | Status: AC
  Administered 2016-09-21: 50 mg via ORAL

## 2016-09-21 MED ORDER — NYSTATIN 100000 UNIT/ML MT SUSP: 5.0000 mL | Freq: Four times a day (QID) | OROMUCOSAL | 0 refills | Status: DC

## 2016-09-21 MED ORDER — LORAZEPAM 2 MG/ML IJ SOLN
INTRAMUSCULAR | Status: AC
  Filled 2016-09-21: qty 1

## 2016-09-21 MED ORDER — DEXAMETHASONE SODIUM PHOSPHATE 10 MG/ML IJ SOLN
INTRAMUSCULAR | Status: AC
  Filled 2016-09-21: qty 1

## 2016-09-21 NOTE — Patient Instructions (Signed)

## 2016-09-21 NOTE — Telephone Encounter (Signed)
Gave patient avs with appts per 8/31 los

## 2016-09-30 NOTE — Progress Notes (Signed)
[name]    HEMATOLOGY/ONCOLOGY CLINIC NOTE  Date of Service: .09/21/2016  Patient Care Team: Pa, [place] as PCP - General (Family Medicine)  PCP: [name] MD   CHIEF COMPLAINTS/PURPOSE OF CONSULTATION:  F/U for Splenic marginal zone lymphoma  HISTORY OF PRESENTING ILLNESS:   Wendy Avery is a wonderful 61 y.o. female who has been referred to [place] by Dr [facility]  for evaluation and management of possible lymphoma in the setting of massive splenomegaly and abdominal lymphadenopathy.  Patient patient has no significant chronic medical issues. She presented to urgent care with abdominal bloating and distention for 1-2 months. Also had some abdominal discomfort in the left upper quadrant of the abdomen and notes that she could feel a hard mass. She also notes some aches in her chest for 1-2 months and significant progressive fatigue which is starting to affect her quality of life. Over the last month she also notes some drenching night sweats but isn't sure if this is related to menopausal symptoms. Denies any overt weight loss. Patient has history of anxiety and notes severe anxiety over the symptoms.  Patient had a CT of the abdomen and pelvis with contrast on 08/03/2016 to further evaluate her symptoms. This showed massive splenomegaly with the spleen measuring 23.0 x 20.2 x 11.6 cm (volume = 2820 cm^3). Also noted to have concurrent  mild abdominal adenopathy. Incidental findings of Indeterminate left renal lesion. Most likely a minimally complex cyst. A solid neoplasm cannot be excluded.  Patient  notes that she wants to figure out the diagnosis ASAP and is very anxious. Reports no history of alcohol abuse or liver disease.  She has them out of colonoscopy but has had fecal occult blood testing recently which was negative. Last mammogram April 2017 with the normal limits. Last Pap smear August 2017 was within normal limits.  INTERVAL  HISTORY  Patient is here for her scheduled follow-up prior to her 4th dose of Rituxan for her SMZL lymphoma for a toxicity check with labs. No evidence TLS on labs. She has had issues with severe anxiety and near panic attacks and needed ativan to calm her down during the prior Rituxan infusion. She notes that she is feeling better and a little less anxious today. Lymphocytosis improved on labs. No LUQ abd discomfort . Spleen is smaller clinically. No fevers/chills/nightsweats.   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 Marital status: Widowed    Spouse name: N/A  . Number of children: N/A  . Years of education: N/A   Occupational History  . Not on file.   Social History Main Topics  . Smoking status: Former Smoker    Quit date: 2010  . Smokeless tobacco: Never Used  . Alcohol use Yes     Comment: occassional   . Drug use: No  . Sexual activity: Not on file   Other Topics Concern  . Not on file   Social History Narrative  . No narrative on file  Ex-smoker 1 pack per day quit 30 years ago Uses alcohol socially She is widowed and has 2 daughters   FAMILY HISTORY: Mom had breast cancer at age 63 years and died  from metastatic disease Father skin cancer, vocal cord cancer, alcohol abuse  ALLERGIES:  ?PCN  MEDICATIONS:  Current Outpatient Prescriptions  Medication Sig Dispense Refill  . ibuprofen (ADVIL,MOTRIN) 200 MG tablet Take 200 mg by mouth every 6 (six) hours as needed.    . nystatin (MYCOSTATIN) 100000 UNIT/ML suspension Take 5 mLs (500,000 Units total) by mouth 4 (four) times daily. 200 mL 0   No current facility-administered medications for this visit.      REVIEW OF SYSTEMS:    10 Point review of Systems was done is negative except as noted above.  PHYSICAL EXAMINATION: ECOG PERFORMANCE STATUS: 1 - Symptomatic but completely ambulatory  . Vitals:   09/21/16 1129  BP: (!) 140/56  Pulse: 60  Resp: 18  Temp: 98.5 F (36.9 C)  SpO2: 100%   Filed Weights   09/21/16 1129  Weight: 194 lb 4.8 oz (88.1 kg)   .Body mass index is 37.95 kg/m.  GENERAL:alert, in no acute distress and comfortable SKIN: no acute rashes, no significant lesions EYES: conjunctiva are pink and non-injected, sclera anicteric OROPHARYNX: MMM, no exudates, no oropharyngeal erythema or ulceration NECK: supple, no JVD LYMPH:  no palpable lymphadenopathy in the cervical, axillary or inguinal regions LUNGS: clear to auscultation b/l with normal respiratory effort HEART: regular rate & rhythm ABDOMEN:  normoactive bowel sounds , non tender, not distended. Clinically the splenomegaly seems to have regressed and is only palpable 3-4 cms undercostal margin. Extremity: no pedal edema PSYCH: alert & oriented x 3 with fluent speech NEURO: no focal motor/sensory deficits  LABORATORY DATA:  I have reviewed the data as listed  . CBC Latest Ref Rng & Units 09/21/2016 09/14/2016 09/07/2016  WBC 3.9 - 10.3 10e3/uL 9.5 10.5(H) 10.7(H)  Hemoglobin 11.6 - 15.9 g/dL 14.5 14.5 13.6  Hematocrit 34.8 - 46.6 % 43.9 43.9 41.6  Platelets 145 - 400 10e3/uL 107(L) 135(L) 139(L)   . CBC    Component Value Date/Time   WBC 9.5 09/21/2016 1025   WBC 5.4 08/13/2016 0902   RBC 5.38 09/21/2016 1025   RBC 4.73 08/13/2016 0902   HGB 14.5 09/21/2016 1025   HCT 43.9 09/21/2016 1025   PLT 107 (L) 09/21/2016 1025   MCV 81.6 09/21/2016 1025   MCH 27.0 09/21/2016 1025   MCH 25.8 (L) 08/13/2016 0902   MCHC 33.0 09/21/2016 1025   MCHC 32.9 08/13/2016 0902   RDW 16.1 (H) 09/21/2016 1025   LYMPHSABS 1.5 09/21/2016 1025   MONOABS 0.4 09/21/2016 1025   EOSABS 0.0 09/21/2016 1025    BASOSABS 0.0 09/21/2016 1025    . CMP Latest Ref Rng & Units 09/21/2016 09/14/2016 09/07/2016  Glucose 70 - 140 mg/dl 98 101 137  BUN 7.0 - 26.0 mg/dL 23.4 20.2 22.5  Creatinine 0.6 - 1.1 mg/dL 0.8 0.8 0.7  Sodium 136 - 145 mEq/L 137 136 137  Potassium 3.5 - 5.1 mEq/L 4.2 4.4 4.2  CO2 22 - 29 mEq/L 26 28 27   Calcium 8.4 - 10.4 mg/dL 9.6 9.7 9.5  Total Protein 6.4 - 8.3 g/dL 6.4 6.7 6.3(L)  Total Bilirubin 0.20 - 1.20 mg/dL 1.06 1.05 0.75  Alkaline Phos 40 - 150 U/L 71 78 89  AST 5 - 34 U/L 33 28 19  ALT 0 - 55 U/L 82(H) 72(H) 49    . Lab Results  Component Value Date   LDH 213 09/21/2016     ...   Component     Latest Ref Rng & Units 09/07/2016  Hep B Core Ab, Tot     Negative Negative  Hepatitis B Surface Ag     Negative Negative   Component     Latest Ref Rng & Units 08/10/2016  Sed Rate     0 - 40 mm/hr 12  LDH     125 - 245 U/L 178  EBV VCA IgM     0.0 - 35.9 U/mL <36.0  CMV IgM Ser EIA-aCnc     0.0 - 29.9 AU/mL <30.0  HIV Screen 4th Generation wRfx     Non Reactive Non Reactive  Hep C Virus Ab     0.0 - 0.9 s/co ratio <0.1     RADIOGRAPHIC STUDIES: I have personally reviewed the radiological images as listed and agreed with the findings in the report. No results found.  ASSESSMENT & PLAN:   61 year old Caucasian female with  #1 Newly diagnosed Stage IV Splenic Marginal Zone lymphoma  Presenting with Massive splenomegaly with the spleen measuring 23.0 x 20.2 x 11.6 cm (volume = 2820 cm^3). Diffusely hypermetabolic on PET/CT No evidence of liver disease on CT scan. No evidence of portal hypertension. MPN w/u demonstrated no evidence of genetics mutations to suggest MPN  #2 Mild intra-abdominal lymphadenopathy.  #3 Mild thrombocytopenia platelets of 107k. 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 Improved to 130-140k range.  #4 Indetermine left renal lesion 1.5 cms in size ?complex cyst - will need  monitoring.  #5 Severe anxiety PLAN -labs today stable with no evidence of TLS. Appears to have clinically had some regression of her splenomegaly. -no prohibitive toxicity from Rituxan -patient appropriate to proceed with 4th and last planned dose of Rituxan with same pre-medications -pre-treatment lorazepam to help with anxieities - patient requests this. -will discontinue Allopurinol   RTC with Dr Irene Limbo in 4 weeks with labs  Orders Placed This Encounter  Procedures  . CBC with Differential    Standing Status:   Future    Number of Occurrences:   1    Standing Expiration Date:   09/21/2017  . Comprehensive metabolic panel    Standing Status:   Future    Number of Occurrences:   1    Standing Expiration Date:   09/21/2017  . Lactate dehydrogenase (LDH)    Standing Status:   Future    Number of Occurrences:   1    Standing Expiration Date:   09/21/2017  . CBC & Diff and Retic    Standing Status:   Future    Standing Expiration Date:   09/21/2017  . Comprehensive metabolic panel    Standing Status:   Future    Standing Expiration Date:   09/21/2017  . Lactate dehydrogenase    Standing Status:   Future    Standing Expiration Date:   09/21/2017  . Flow Cytometry    H/o Splenic Marginal zone lymphoma --- post treatment re-assessment    Standing Status:   Future    Standing Expiration Date:   09/21/2017     All of the patients questions were answered with apparent satisfaction. The patient knows to call the clinic with any problems, questions or concerns.  I spent 20 minutes counseling the patient face to face. The total time spent in the appointment was 25 minutes and more than 50% was on counseling and direct patient cares.    Sullivan Lone MD [place] AAHIVMS [facility] Hematology/Oncology Physician [place]  (Office):  [phone] (Work cell):  [phone] (Fax):           [phone]

## 2016-10-03 DIAGNOSIS — D252 Subserosal leiomyoma of uterus: Secondary | ICD-10-CM | POA: Diagnosis not present

## 2016-10-03 DIAGNOSIS — R109 Unspecified abdominal pain: Secondary | ICD-10-CM | POA: Diagnosis not present

## 2016-10-06 DIAGNOSIS — L089 Local infection of the skin and subcutaneous tissue, unspecified: Secondary | ICD-10-CM | POA: Diagnosis not present

## 2016-10-06 DIAGNOSIS — M5489 Other dorsalgia: Secondary | ICD-10-CM | POA: Diagnosis not present

## 2016-10-11 DIAGNOSIS — C8580 Other specified types of non-Hodgkin lymphoma, unspecified site: Secondary | ICD-10-CM | POA: Diagnosis not present

## 2016-10-11 DIAGNOSIS — L089 Local infection of the skin and subcutaneous tissue, unspecified: Secondary | ICD-10-CM | POA: Diagnosis not present

## 2016-10-11 DIAGNOSIS — Z0000 Encounter for general adult medical examination without abnormal findings: Secondary | ICD-10-CM | POA: Diagnosis not present

## 2016-10-11 DIAGNOSIS — M549 Dorsalgia, unspecified: Secondary | ICD-10-CM | POA: Diagnosis not present

## 2016-10-17 NOTE — Progress Notes (Signed)
[name]    HEMATOLOGY/ONCOLOGY CLINIC NOTE  Date of Service: 10/19/16  Patient Care Team: Pa, [place] as PCP - General (Family Medicine)  PCP: [name] MD   CHIEF COMPLAINTS/PURPOSE OF CONSULTATION:  F/U for Splenic marginal zone lymphoma  HISTORY OF PRESENTING ILLNESS:   Wendy Avery is a wonderful 61 y.o. female who has been referred to [place] by [facility]  for evaluation and management of possible lymphoma in the setting of massive splenomegaly and abdominal lymphadenopathy.  Patient patient has no significant chronic medical issues. She presented to urgent care with abdominal bloating and distention for 1-2 months. Also had some abdominal discomfort in the left upper quadrant of the abdomen and notes that she could feel a hard mass. She also notes some aches in her chest for 1-2 months and significant progressive fatigue which is starting to affect her quality of life. Over the last month she also notes some drenching night sweats but isn't sure if this is related to menopausal symptoms. Denies any overt weight loss. Patient has history of anxiety and notes severe anxiety over the symptoms.  Patient had a CT of the abdomen and pelvis with contrast on 08/03/2016 to further evaluate her symptoms. This showed massive splenomegaly with the spleen measuring 23.0 x 20.2 x 11.6 cm (volume = 2820 cm^3). Also noted to have concurrent  mild abdominal adenopathy. Incidental findings of Indeterminate left renal lesion. Most likely a minimally complex cyst. A solid neoplasm cannot be excluded.  Patient  notes that she wants to figure out the diagnosis ASAP and is very anxious. Reports no history of alcohol abuse or liver disease.  She has them out of colonoscopy but has had fecal occult blood testing recently which was negative. Last mammogram April 2017 with the normal limits. Last Pap smear August 2017 was within normal limits.  INTERVAL  HISTORY  Patient is here for her scheduled follow-up after completion of planned 4 weekly doses of Rituxan for her SMZL Overall, she reports she is doing well and much improved. She states that the course of her steroids helped her significantly and her appetite is much improved. Her platelets have normalized to 150k. Hgb is also normal at 13.0.  Her anxiety and panic attacks regards the course of her disease has much improved. She also reports that the cramps in her bilateral feet have improved with steroids as well. She did previously take a course of Doxycycline for a possible right second toe infection, she states that this has improved since finishing this course and there are no signs of this on exam. She reports that she has had some mild foot swelling over the past few weeks. She also notes some mild URI-like symptoms over the past few weeks as well. No recent shortness of breath, fever, or other concerning infectious like-symptoms warranting admission. She has not received her influenza or pneumonia vaccination this year. Spleen is smaller clinically. No fevers/chills/nightsweats.   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 Marital status: Widowed    Spouse name: N/A  . Number of children: N/A  . Years of education: N/A   Occupational History  . Not on file.  Social History Main Topics  . Smoking status: Former Smoker    Quit date: 2010  . Smokeless tobacco: Never Used  . Alcohol use Yes     Comment: occassional   . Drug use: No  . Sexual activity: Not on file   Other Topics Concern  . Not on file   Social History Narrative  . No narrative on file  Ex-smoker 1 pack per day  quit 30 years ago Uses alcohol socially She is widowed and has 2 daughters   FAMILY HISTORY: Mom had breast cancer at age 73 years and died from metastatic disease Father skin cancer, vocal cord cancer, alcohol abuse  ALLERGIES:  ?PCN  MEDICATIONS:  Current Outpatient Prescriptions  Medication Sig Dispense Refill  . ibuprofen (ADVIL,MOTRIN) 200 MG tablet Take 200 mg by mouth every 6 (six) hours as needed.    . nystatin (MYCOSTATIN) 100000 UNIT/ML suspension Take 5 mLs (500,000 Units total) by mouth 4 (four) times daily. 200 mL 0   No current facility-administered medications for this visit.     REVIEW OF SYSTEMS:   A 10+ POINT REVIEW OF SYSTEMS WAS OBTAINED including neurology, dermatology, psychiatry, cardiac, respiratory, lymph, extremities, GI, GU, Musculoskeletal, constitutional, breasts, reproductive, HEENT.  All pertinent positives are noted in the HPI.  All others are negative.  PHYSICAL EXAMINATION: ECOG PERFORMANCE STATUS: 1 - Symptomatic but completely ambulatory  . Vitals:   10/19/16 1019  BP: 118/66  Pulse: 62  Resp: 17  Temp: 97.6 F (36.4 C)  SpO2: 100%   Filed Weights   10/19/16 1019  Weight: 200 lb 12.8 oz (91.1 kg)   .Body mass index is 39.22 kg/m.  GENERAL:alert, in no acute distress and comfortable SKIN: no acute rashes, no significant lesions EYES: conjunctiva are pink and non-injected, sclera anicteric OROPHARYNX: MMM, no exudates, no oropharyngeal erythema or ulceration. No signs of thrush.  NECK: supple, no JVD LYMPH:  no palpable lymphadenopathy in the cervical, axillary or inguinal regions LUNGS: clear to auscultation b/l with normal respiratory effort HEART: regular rate & rhythm ABDOMEN:  normoactive bowel sounds, non tender, not distended. Clinically the splenomegaly seems to have regressed and is only palpable 1-2 cms under costal margin. Mild tenderness to deep palpation over the LUQ.  Extremity: no pedal edema PSYCH: alert & oriented  x 3 with fluent speech NEURO: no focal motor/sensory deficits  LABORATORY DATA:  I have reviewed the data as listed  . CBC Latest Ref Rng & Units 10/19/2016 09/21/2016 09/14/2016  WBC 3.9 - 10.3 10e3/uL 5.4 9.5 10.5(H)  Hemoglobin 11.6 - 15.9 g/dL 13.0 14.5 14.5  Hematocrit 34.8 - 46.6 % 39.4 43.9 43.9  Platelets 145 - 400 10e3/uL 150 107(L) 135(L)   . CBC    Component Value Date/Time   WBC 5.4 10/19/2016 1006   WBC 5.4 08/13/2016 0902   RBC 4.75 10/19/2016 1006   RBC 4.73 08/13/2016 0902   HGB 13.0 10/19/2016 1006   HCT 39.4 10/19/2016 1006   PLT 150 10/19/2016 1006   MCV 82.9 10/19/2016 1006   MCH 27.4 10/19/2016 1006   MCH 25.8 (L) 08/13/2016 0902   MCHC 33.0 10/19/2016 1006   MCHC 32.9 08/13/2016 0902   RDW 16.9 (H) 10/19/2016 1006   LYMPHSABS 1.6 10/19/2016 1006   MONOABS 0.5 10/19/2016 1006   EOSABS 0.1 10/19/2016 1006   BASOSABS 0.0 10/19/2016 1006    . CMP Latest Ref Rng & Units 10/19/2016 09/21/2016 09/14/2016  Glucose 70 - 140 mg/dl 66(L) 98 101  BUN 7.0 - 26.0 mg/dL 18.1 23.4 20.2  Creatinine 0.6 - 1.1 mg/dL 0.7 0.8 0.8  Sodium 136 - 145 mEq/L 143 137 136  Potassium 3.5 - 5.1 mEq/L 4.0 4.2 4.4  CO2 22 - 29 mEq/L 28 26 28   Calcium 8.4 - 10.4 mg/dL 9.8 9.6 9.7  Total Protein 6.4 - 8.3 g/dL 6.4 6.4 6.7  Total Bilirubin 0.20 - 1.20 mg/dL 0.77 1.06 1.05  Alkaline Phos 40 - 150 U/L 108 71 78  AST 5 - 34 U/L 21 33 28  ALT 0 - 55 U/L 26 82(H) 72(H)    . Lab Results  Component Value Date   LDH 182 10/19/2016     ...   Component     Latest Ref Rng & Units 09/07/2016  Hep B Core Ab, Tot     Negative Negative  Hepatitis B Surface Ag     Negative Negative   Component     Latest Ref Rng & Units 08/10/2016  Sed Rate     0 - 40 mm/hr 12  LDH     125 - 245 U/L 178  EBV VCA IgM     0.0 - 35.9 U/mL <36.0  CMV IgM Ser EIA-aCnc     0.0 - 29.9 AU/mL <30.0  HIV Screen 4th Generation wRfx     Non Reactive Non Reactive  Hep C Virus Ab     0.0 - 0.9 s/co  ratio <0.1     RADIOGRAPHIC STUDIES: I have personally reviewed the radiological images as listed and agreed with the findings in the report. No results found.  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 Diffusely hypermetabolic on PET/CT No evidence of liver disease on CT scan. No evidence of portal hypertension. MPN w/u demonstrated no evidence of genetics mutations to suggest MPN  #2 Mild intra-abdominal lymphadenopathy.  #3 Mild thrombocytopenia platelets of 107k. 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 Platelets have normalized completely to 150K.   #4 Indetermine left renal lesion 1.5 cms in size ?complex cyst - will need monitoring.  #5 Severe anxiety- improved a little.  PLAN -labs today stable with no evidence of lymphoma progression at this time. LDH normalized. -Cytopenia and lymphocytosis resolved. -. Appears to have clinically had some regression of her splenomegaly. -no prohibitive toxicity tolerating Rituxan.  -clinically she is doing very well and her counts have normalized, platelets are 150K today. No signs or symptoms of toxicity on exam. She has had a deep response based on clinical exam and lab work.  -We will reevaluate splenomegaly in 6wk with [place].  -We will see her back in 33mo with repeat lab work. We will determine the need for maintenance Rituxan at that time.  -Flu shot today, Prevnar in 35mo. Pneumococcal vaccine following this. I advised her to avoid sick contacts if possible.   Flu shot today [place] LUQ in 6wk RTC with Dr Irene Limbo in 48mo with labs and Prevnar vaccine  Orders Placed This Encounter  Procedures  . US Abdomen Complete    Standing Status:   Future    Standing Expiration Date:   10/19/2017    Order Specific Question:    Reason for Exam (SYMPTOM  OR DIAGNOSIS REQUIRED)    Answer:   Assess for regression of  splenomegaly s/p treatment for Splenoc marginal zone lymphoma    Order Specific Question:   Preferred imaging location?    Answer:   [facility]  . CBC & Diff and Retic    Standing Status:   Future    Standing Expiration Date:   10/19/2017  . Comprehensive metabolic panel    Standing Status:   Future    Standing Expiration Date:   10/19/2017  . Lactate dehydrogenase    Standing Status:   Future    Standing Expiration Date:   10/19/2017   All of the patients questions were answered with apparent satisfaction. The patient knows to call the clinic with any problems, questions or concerns.  I spent 20 minutes counseling the patient face to face. The total time spent in the appointment was 25 minutes and more than 50% was on counseling and direct patient cares.    Sullivan Lone MD [place] AAHIVMS [facility] Hematology/Oncology Physician [facility]  (Office):       [phone] (Work cell):  [phone] (Fax):           [phone]  This document serves as a record of services personally performed by Sullivan Lone, MD. It was created on his behalf by Reola Mosher, a trained medical scribe. The creation of this record is based on the scribe's personal observations and the provider's statements to them. This document has been checked and approved by the attending provider.

## 2016-10-19 ENCOUNTER — Other Ambulatory Visit (HOSPITAL_COMMUNITY)
Admission: RE | Admit: 2016-10-19 | Discharge: 2016-10-19 | Disposition: A | Discharge status: Home / Self Care | Payer: BLUE CROSS/BLUE SHIELD | Source: Ambulatory Visit | Attending: Hematology | Admitting: Hematology

## 2016-10-19 ENCOUNTER — Telehealth: Payer: Self-pay | Admitting: Hematology

## 2016-10-19 ENCOUNTER — Other Ambulatory Visit (HOSPITAL_BASED_OUTPATIENT_CLINIC_OR_DEPARTMENT_OTHER): Payer: BLUE CROSS/BLUE SHIELD

## 2016-10-19 ENCOUNTER — Ambulatory Visit (HOSPITAL_BASED_OUTPATIENT_CLINIC_OR_DEPARTMENT_OTHER): Payer: BLUE CROSS/BLUE SHIELD | Admitting: Hematology

## 2016-10-19 VITALS — BP 118/66 | HR 62 | Temp 97.6°F | Resp 17 | Ht 60.0 in | Wt 200.8 lb

## 2016-10-19 DIAGNOSIS — Z23 Encounter for immunization: Secondary | ICD-10-CM

## 2016-10-19 DIAGNOSIS — R161 Splenomegaly, not elsewhere classified: Secondary | ICD-10-CM

## 2016-10-19 DIAGNOSIS — R591 Generalized enlarged lymph nodes: Secondary | ICD-10-CM | POA: Diagnosis not present

## 2016-10-19 DIAGNOSIS — F419 Anxiety disorder, unspecified: Secondary | ICD-10-CM | POA: Diagnosis not present

## 2016-10-19 DIAGNOSIS — C8307 Small cell B-cell lymphoma, spleen: Secondary | ICD-10-CM | POA: Diagnosis not present

## 2016-10-19 DIAGNOSIS — N289 Disorder of kidney and ureter, unspecified: Secondary | ICD-10-CM | POA: Diagnosis not present

## 2016-10-19 DIAGNOSIS — D696 Thrombocytopenia, unspecified: Secondary | ICD-10-CM | POA: Diagnosis not present

## 2016-10-19 LAB — CBC & DIFF AND RETIC
BASO%: 0.4 % (ref 0.0–2.0)
BASOS ABS: 0 10*3/uL (ref 0.0–0.1)
EOS ABS: 0.1 10*3/uL (ref 0.0–0.5)
EOS%: 1.3 % (ref 0.0–7.0)
HEMATOCRIT: 39.4 % (ref 34.8–46.6)
HEMOGLOBIN: 13 g/dL (ref 11.6–15.9)
IMMATURE RETIC FRACT: 5.1 % (ref 1.60–10.00)
LYMPH#: 1.6 10*3/uL (ref 0.9–3.3)
LYMPH%: 29 % (ref 14.0–49.7)
MCH: 27.4 pg (ref 25.1–34.0)
MCHC: 33 g/dL (ref 31.5–36.0)
MCV: 82.9 fL (ref 79.5–101.0)
MONO#: 0.5 10*3/uL (ref 0.1–0.9)
MONO%: 9.1 % (ref 0.0–14.0)
NEUT%: 60.2 % (ref 38.4–76.8)
NEUTROS ABS: 3.2 10*3/uL (ref 1.5–6.5)
PLATELETS: 150 10*3/uL (ref 145–400)
RBC: 4.75 10*6/uL (ref 3.70–5.45)
RDW: 16.9 % — ABNORMAL HIGH (ref 11.2–14.5)
Retic %: 2.35 % — ABNORMAL HIGH (ref 0.70–2.10)
Retic Ct Abs: 111.63 10*3/uL — ABNORMAL HIGH (ref 33.70–90.70)
WBC: 5.4 10*3/uL (ref 3.9–10.3)

## 2016-10-19 LAB — COMPREHENSIVE METABOLIC PANEL
ALBUMIN: 3.8 g/dL (ref 3.5–5.0)
ALT: 26 U/L (ref 0–55)
ANION GAP: 7 meq/L (ref 3–11)
AST: 21 U/L (ref 5–34)
Alkaline Phosphatase: 108 U/L (ref 40–150)
BILIRUBIN TOTAL: 0.77 mg/dL (ref 0.20–1.20)
BUN: 18.1 mg/dL (ref 7.0–26.0)
CALCIUM: 9.8 mg/dL (ref 8.4–10.4)
CO2: 28 mEq/L (ref 22–29)
Chloride: 108 mEq/L (ref 98–109)
Creatinine: 0.7 mg/dL (ref 0.6–1.1)
GLUCOSE: 66 mg/dL — AB (ref 70–140)
POTASSIUM: 4 meq/L (ref 3.5–5.1)
Sodium: 143 mEq/L (ref 136–145)
TOTAL PROTEIN: 6.4 g/dL (ref 6.4–8.3)

## 2016-10-19 LAB — LACTATE DEHYDROGENASE: LDH: 182 U/L (ref 125–245)

## 2016-10-19 MED ORDER — INFLUENZA VAC SPLIT QUAD 0.5 ML IM SUSY
0.5000 mL | PREFILLED_SYRINGE | Freq: Once | INTRAMUSCULAR | Status: AC
  Administered 2016-10-19: 0.5 mL via INTRAMUSCULAR
  Filled 2016-10-19: qty 0.5

## 2016-10-19 NOTE — Telephone Encounter (Signed)
Gave patient AVS and calendar of upcoming November appointment.  °

## 2016-10-26 LAB — FLOW CYTOMETRY

## 2016-11-30 ENCOUNTER — Ambulatory Visit (HOSPITAL_COMMUNITY)
Admission: RE | Admit: 2016-11-30 | Discharge: 2016-11-30 | Disposition: A | Discharge status: Home / Self Care | Payer: BLUE CROSS/BLUE SHIELD | Source: Ambulatory Visit | Attending: Hematology | Admitting: Hematology

## 2016-11-30 DIAGNOSIS — C8307 Small cell B-cell lymphoma, spleen: Secondary | ICD-10-CM | POA: Diagnosis not present

## 2016-11-30 DIAGNOSIS — R161 Splenomegaly, not elsewhere classified: Secondary | ICD-10-CM | POA: Insufficient documentation

## 2016-12-03 ENCOUNTER — Encounter (INDEPENDENT_AMBULATORY_CARE_PROVIDER_SITE_OTHER): Payer: Self-pay

## 2016-12-10 ENCOUNTER — Telehealth: Payer: Self-pay

## 2016-12-10 NOTE — Telephone Encounter (Signed)
Pt called asking if it was OK to get a dental filling. S/w Dr Irene Limbo and LVM to let her know it was OK.

## 2016-12-14 ENCOUNTER — Telehealth: Payer: Self-pay | Admitting: Hematology

## 2016-12-14 NOTE — Telephone Encounter (Signed)
[place] [facility] - moved 11/30 appointments to 12/10. Not able to reach patient by mail or leave message. Schedule mailed.

## 2016-12-21 ENCOUNTER — Ambulatory Visit: Payer: BLUE CROSS/BLUE SHIELD | Admitting: Hematology

## 2016-12-21 ENCOUNTER — Other Ambulatory Visit: Payer: BLUE CROSS/BLUE SHIELD

## 2016-12-28 ENCOUNTER — Telehealth: Payer: Self-pay | Admitting: *Deleted

## 2016-12-28 NOTE — Telephone Encounter (Signed)
Sw pt regarding upcoming inclement weather.  Pt wishes to reschedule apts.  Message sent high priority to reschedule.

## 2016-12-31 ENCOUNTER — Other Ambulatory Visit: Payer: BLUE CROSS/BLUE SHIELD

## 2016-12-31 ENCOUNTER — Ambulatory Visit: Payer: BLUE CROSS/BLUE SHIELD | Admitting: Hematology

## 2017-01-01 ENCOUNTER — Encounter (INDEPENDENT_AMBULATORY_CARE_PROVIDER_SITE_OTHER): Payer: Self-pay

## 2017-01-02 ENCOUNTER — Telehealth: Payer: Self-pay | Admitting: Hematology

## 2017-01-02 ENCOUNTER — Telehealth: Payer: Self-pay | Admitting: *Deleted

## 2017-01-02 NOTE — Telephone Encounter (Signed)
SW patient regarding change of apt.  Pt informed to come tomorrow 12/13 @ 12pm for lab and 12:30 with Dr. Irene Limbo.  Pt verbalized understanding.  Message sent to Ginette Otto in scheduling to change apt times.

## 2017-01-02 NOTE — Telephone Encounter (Signed)
Scheduled appts per 12/12 sch msg - spoke with patients regarding appts that were added.

## 2017-01-03 ENCOUNTER — Ambulatory Visit (HOSPITAL_BASED_OUTPATIENT_CLINIC_OR_DEPARTMENT_OTHER): Payer: BLUE CROSS/BLUE SHIELD | Admitting: Hematology

## 2017-01-03 ENCOUNTER — Telehealth: Payer: Self-pay | Admitting: Hematology

## 2017-01-03 ENCOUNTER — Other Ambulatory Visit (HOSPITAL_BASED_OUTPATIENT_CLINIC_OR_DEPARTMENT_OTHER): Payer: BLUE CROSS/BLUE SHIELD

## 2017-01-03 ENCOUNTER — Encounter: Payer: Self-pay | Admitting: Hematology

## 2017-01-03 VITALS — BP 131/58 | HR 56 | Temp 97.8°F | Resp 18 | Ht 60.0 in | Wt 204.7 lb

## 2017-01-03 DIAGNOSIS — C8307 Small cell B-cell lymphoma, spleen: Secondary | ICD-10-CM

## 2017-01-03 DIAGNOSIS — D419 Neoplasm of uncertain behavior of unspecified urinary organ: Secondary | ICD-10-CM | POA: Diagnosis not present

## 2017-01-03 DIAGNOSIS — Z23 Encounter for immunization: Secondary | ICD-10-CM | POA: Diagnosis not present

## 2017-01-03 DIAGNOSIS — D731 Hypersplenism: Secondary | ICD-10-CM | POA: Diagnosis not present

## 2017-01-03 DIAGNOSIS — R161 Splenomegaly, not elsewhere classified: Secondary | ICD-10-CM

## 2017-01-03 DIAGNOSIS — N289 Disorder of kidney and ureter, unspecified: Secondary | ICD-10-CM

## 2017-01-03 LAB — CBC & DIFF AND RETIC
BASO%: 0.3 % (ref 0.0–2.0)
Basophils Absolute: 0 10*3/uL (ref 0.0–0.1)
EOS%: 3.1 % (ref 0.0–7.0)
Eosinophils Absolute: 0.2 10*3/uL (ref 0.0–0.5)
HCT: 44.4 % (ref 34.8–46.6)
HGB: 15 g/dL (ref 11.6–15.9)
IMMATURE RETIC FRACT: 2.4 % (ref 1.60–10.00)
LYMPH%: 26.3 % (ref 14.0–49.7)
MCH: 29.4 pg (ref 25.1–34.0)
MCHC: 33.8 g/dL (ref 31.5–36.0)
MCV: 86.9 fL (ref 79.5–101.0)
MONO#: 0.4 10*3/uL (ref 0.1–0.9)
MONO%: 6.3 % (ref 0.0–14.0)
NEUT%: 64 % (ref 38.4–76.8)
NEUTROS ABS: 4.3 10*3/uL (ref 1.5–6.5)
PLATELETS: 169 10*3/uL (ref 145–400)
RBC: 5.11 10*6/uL (ref 3.70–5.45)
RDW: 12.9 % (ref 11.2–14.5)
Retic %: 1.31 % (ref 0.70–2.10)
Retic Ct Abs: 66.94 10*3/uL (ref 33.70–90.70)
WBC: 6.7 10*3/uL (ref 3.9–10.3)
lymph#: 1.8 10*3/uL (ref 0.9–3.3)

## 2017-01-03 LAB — COMPREHENSIVE METABOLIC PANEL
ALT: 67 U/L — AB (ref 0–55)
AST: 41 U/L — ABNORMAL HIGH (ref 5–34)
Albumin: 4.2 g/dL (ref 3.5–5.0)
Alkaline Phosphatase: 132 U/L (ref 40–150)
Anion Gap: 9 mEq/L (ref 3–11)
BILIRUBIN TOTAL: 0.5 mg/dL (ref 0.20–1.20)
BUN: 21.1 mg/dL (ref 7.0–26.0)
CHLORIDE: 104 meq/L (ref 98–109)
CO2: 26 meq/L (ref 22–29)
CREATININE: 0.8 mg/dL (ref 0.6–1.1)
Calcium: 9.3 mg/dL (ref 8.4–10.4)
GLUCOSE: 82 mg/dL (ref 70–140)
Potassium: 4 mEq/L (ref 3.5–5.1)
SODIUM: 139 meq/L (ref 136–145)
TOTAL PROTEIN: 6.8 g/dL (ref 6.4–8.3)

## 2017-01-03 LAB — LACTATE DEHYDROGENASE: LDH: 198 U/L (ref 125–245)

## 2017-01-03 MED ORDER — PNEUMOCOCCAL 13-VAL CONJ VACC IM SUSP
0.5000 mL | Freq: Once | INTRAMUSCULAR | Status: AC
  Administered 2017-01-03: 0.5 mL via INTRAMUSCULAR
  Filled 2017-01-03: qty 0.5

## 2017-01-03 NOTE — Telephone Encounter (Signed)
Gave avs and calendar for march 2019 

## 2017-01-03 NOTE — Patient Instructions (Addendum)
Thank you for choosing [place] to provide your oncology and hematology care.  To afford each patient quality time with our providers, please arrive 30 minutes before your scheduled appointment time.  If you arrive late for your appointment, you may be asked to reschedule.  We strive to give you quality time with our providers, and arriving late affects you and other patients whose appointments are after yours.   If you are a no show for multiple scheduled visits, you may be dismissed from the clinic at the providers discretion.    Again, thank you for choosing [facility], our hope is that these requests will decrease the amount of time that you wait before being seen by our physicians.  ______________________________________________________________________  Should you have questions after your visit to the [facility], please contact our office at [phone] between the hours of 8:30 and 4:30 p.m.    Voicemails left after 4:30p.m will not be returned until the following business day.    For prescription refill requests, please have your pharmacy contact us directly.  Please also try to allow 48 hours for prescription requests.    Please contact the scheduling department for questions regarding scheduling.  For scheduling of procedures such as PET scans, CT scans, MRI, Ultrasound, etc please contact central scheduling at [phone].    Resources For Cancer Patients and Caregivers:   Oncolink.org:  A wonderful resource for patients and healthcare providers for information regarding your disease, ways to tract your treatment, what to expect, etc.     [place]:  628-326-9597  Can help patients locate various types of support and financial assistance  Cancer Care: 1-800-813-HOPE 346-870-7245) Provides financial assistance, online support groups, medication/co-pay assistance.    [place]:  321-378-3347 Where to apply for food  stamps, Medicaid, and utility assistance  Medicare Rights Center: (854)033-1649 Helps people with Medicare understand their rights and benefits, navigate the Medicare system, and secure the quality healthcare they deserve  SCAT: [place] [employer]'s shared-ride transportation service for eligible riders who have a disability that prevents them from riding the fixed route bus.    For additional information on assistance programs please contact our social worker:   Sharren Bridge:  [phone]           Thank you for choosing [place] to provide your oncology and hematology care.  To afford each patient quality time with our providers, please arrive 30 minutes before your scheduled appointment time.  If you arrive late for your appointment, you may be asked to reschedule.  We strive to give you quality time with our providers, and arriving late affects you and other patients whose appointments are after yours.   If you are a no show for multiple scheduled visits, you may be dismissed from the clinic at the providers discretion.    Again, thank you for choosing [facility], our hope is that these requests will decrease the amount of time that you wait before being seen by our physicians.  ______________________________________________________________________  Should you have questions after your visit to the [facility], please contact our office at [phone] between the hours of 8:30 and 4:30 p.m.    Voicemails left after 4:30p.m will not be returned until the following business day.    For prescription refill requests, please have your pharmacy contact us directly.  Please also try to allow 48 hours for prescription  requests.    Please contact the scheduling department for questions regarding scheduling.  For scheduling of procedures such as PET scans, CT scans, MRI, Ultrasound, etc please contact central  scheduling at [phone].    Resources For Cancer Patients and Caregivers:   Oncolink.org:  A wonderful resource for patients and healthcare providers for information regarding your disease, ways to tract your treatment, what to expect, etc.     [place]:  (513)730-6835  Can help patients locate various types of support and financial assistance  Cancer Care: 1-800-813-HOPE 8313883055) Provides financial assistance, online support groups, medication/co-pay assistance.    [place]:  703-142-5599 Where to apply for food stamps, Medicaid, and utility assistance  Medicare Rights Center: 340-778-6924 Helps people with Medicare understand their rights and benefits, navigate the Medicare system, and secure the quality healthcare they deserve  SCAT: [place] [employer]'s shared-ride transportation service for eligible riders who have a disability that prevents them from riding the fixed route bus.    For additional information on assistance programs please contact our social worker:   Sharren Bridge:  [phone]           Pneumococcal Conjugate Vaccine suspension for injection What is this medicine? PNEUMOCOCCAL VACCINE (NEU mo KOK al vak SEEN) is a vaccine used to prevent pneumococcus bacterial infections. These bacteria can cause serious infections like pneumonia, meningitis, and blood infections. This vaccine will lower your chance of getting pneumonia. If you do get pneumonia, it can make your symptoms milder and your illness shorter. This vaccine will not treat an infection and will not cause infection. This vaccine is recommended for infants and young children, adults with certain medical conditions, and adults 16 years or older. This medicine may be used for other purposes; ask your health care provider or pharmacist if you have questions. COMMON BRAND NAME(S): Prevnar, Prevnar 13 What should I tell my health care provider  before I take this medicine? They need to know if you have any of these conditions: -bleeding problems -fever -immune system problems -an unusual or allergic reaction to pneumococcal vaccine, diphtheria toxoid, other vaccines, latex, other medicines, foods, dyes, or preservatives -pregnant or trying to get pregnant -breast-feeding How should I use this medicine? This vaccine is for injection into a muscle. It is given by a health care professional. A copy of Vaccine Information Statements will be given before each vaccination. Read this sheet carefully each time. The sheet may change frequently. Talk to your pediatrician regarding the use of this medicine in children. While this drug may be prescribed for children as young as 9 weeks old for selected conditions, precautions do apply. Overdosage: If you think you have taken too much of this medicine contact a poison control center or emergency room at once. NOTE: This medicine is only for you. Do not share this medicine with others. What if I miss a dose? It is important not to miss your dose. Call your doctor or health care professional if you are unable to keep an appointment. What may interact with this medicine? -medicines for cancer chemotherapy -medicines that suppress your immune function -steroid medicines like prednisone or cortisone This list may not describe all possible interactions. Give your health care provider a list of all the medicines, herbs, non-prescription drugs, or dietary supplements you use. Also tell them if you smoke, drink alcohol, or use illegal drugs. Some items may interact with your medicine. What should I watch for while using this medicine? Mild fever  and pain should go away in 3 days or less. Report any unusual symptoms to your doctor or health care professional. What side effects may I notice from receiving this medicine? Side effects that you should report to your doctor or health care professional as soon  as possible: -allergic reactions like skin rash, itching or hives, swelling of the face, lips, or tongue -breathing problems -confused -fast or irregular heartbeat -fever over 102 degrees F -seizures -unusual bleeding or bruising -unusual muscle weakness Side effects that usually do not require medical attention (report to your doctor or health care professional if they continue or are bothersome): -aches and pains -diarrhea -fever of 102 degrees F or less -headache -irritable -loss of appetite -pain, tender at site where injected -trouble sleeping This list may not describe all possible side effects. Call your doctor for medical advice about side effects. You may report side effects to FDA at 1-800-FDA-1088. Where should I keep my medicine? This does not apply. This vaccine is given in a clinic, pharmacy, doctor's office, or other health care setting and will not be stored at home. NOTE: This sheet is a summary. It may not cover all possible information. If you have questions about this medicine, talk to your doctor, pharmacist, or health care provider.  2018 Elsevier/Gold Standard (2013-10-15 10:27:27)  Thank you for choosing [facility] to provide your oncology and hematology care.  To afford each patient quality time with our providers, please arrive 30 minutes before your scheduled appointment time.  If you arrive late for your appointment, you may be asked to reschedule.  We strive to give you quality time with our providers, and arriving late affects you and other patients whose appointments are after yours.   If you are a no show for multiple scheduled visits, you may be dismissed from the clinic at the providers discretion.    Again, thank you for choosing [facility], our hope is that these requests will decrease the amount of time that you wait before being seen by our physicians.   ______________________________________________________________________  Should you have questions after your visit to the [facility], please contact our office at [phone] between the hours of 8:30 and 4:30 p.m.    Voicemails left after 4:30p.m will not be returned until the following business day.    For prescription refill requests, please have your pharmacy contact us directly.  Please also try to allow 48 hours for prescription requests.    Please contact the scheduling department for questions regarding scheduling.  For scheduling of procedures such as PET scans, CT scans, MRI, Ultrasound, etc please contact central scheduling at [phone].    Resources For Cancer Patients and Caregivers:   Oncolink.org:  A wonderful resource for patients and healthcare providers for information regarding your disease, ways to tract your treatment, what to expect, etc.     [place]:  [phone]  Can help patients locate various types of support and financial assistance  Cancer Care: 1-800-813-HOPE 775-016-6200) Provides financial assistance, online support groups, medication/co-pay assistance.    [place]:  [phone] Where to apply for food stamps, Medicaid, and utility assistance  Medicare Rights Center: 343-317-6741 Helps people with Medicare understand their rights and benefits, navigate the Medicare system, and secure the quality healthcare they deserve  SCAT: [place] [employer]'s shared-ride transportation service for eligible riders who have a disability that prevents them from riding the fixed route bus.    For  additional information on assistance programs please contact our social worker:   Sharren Bridge:  [phone]

## 2017-01-21 NOTE — Progress Notes (Signed)
[name]    HEMATOLOGY/ONCOLOGY CLINIC NOTE  Date of Service: .01/03/2017  Patient Care Team: [place], [place] as PCP - General (Family Medicine)  PCP: [name] MD   CHIEF COMPLAINTS/PURPOSE OF CONSULTATION:  F/U for Splenic marginal zone lymphoma  HISTORY OF PRESENTING ILLNESS:   Wendy Avery is a wonderful 61 y.o. female who has been referred to [place] by Dr [facility]  for evaluation and management of possible lymphoma in the setting of massive splenomegaly and abdominal lymphadenopathy.  Patient patient has no significant chronic medical issues. She presented to urgent care with abdominal bloating and distention for 1-2 months. Also had some abdominal discomfort in the left upper quadrant of the abdomen and notes that she could feel a hard mass. She also notes some aches in her chest for 1-2 months and significant progressive fatigue which is starting to affect her quality of life. Over the last month she also notes some drenching night sweats but isn't sure if this is related to menopausal symptoms. Denies any overt weight loss. Patient has history of anxiety and notes severe anxiety over the symptoms.  Patient had a CT of the abdomen and pelvis with contrast on 08/03/2016 to further evaluate her symptoms. This showed massive splenomegaly with the spleen measuring 23.0 x 20.2 x 11.6 cm (volume = 2820 cm^3). Also noted to have concurrent  mild abdominal adenopathy. Incidental findings of Indeterminate left renal lesion. Most likely a minimally complex cyst. A solid neoplasm cannot be excluded.  Patient  notes that she wants to figure out the diagnosis ASAP and is very anxious. Reports no history of alcohol abuse or liver disease.  She has them out of colonoscopy but has had fecal occult blood testing recently which was negative. Last mammogram April 2017 with the normal limits. Last Pap smear August 2017 was within normal limits.  INTERVAL  HISTORY  Patient is here for her scheduled follow-up for her SMZL She notes that she is feeling much better. No fevers/chills/nightsweats.  No abdominal pain or distension. [place] Abd showed - near complete resolution of splenomegaly from [place].   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 Marital status: Widowed    Spouse name: Not on file  . Number of children: Not on file  . Years of education: Not on file  . Highest education level: Not on file  Social Needs  . Financial resource strain: Not on file  . Food insecurity - worry: Not on file  . Food insecurity - inability: Not on file  . Transportation needs - medical: Not on file  . Transportation needs - non-medical: Not on file  Occupational History  . Not on file  Tobacco Use  . Smoking status: Former Smoker    Last attempt to quit: 2010    Years since quitting: 9.0  . Smokeless tobacco: Never Used  Substance and Sexual Activity  . Alcohol use: Yes    Comment: occassional   . Drug use: No  . Sexual activity: Not on file  Other Topics Concern  . Not on file  Social History Narrative  . Not on file  Ex-smoker 1 pack per day quit 30 years ago  Uses alcohol socially She is widowed and has 2 daughters   FAMILY HISTORY: Mom had breast cancer at age 39 years and died from metastatic disease Father skin cancer, vocal cord cancer, alcohol abuse  ALLERGIES:  ?PCN  MEDICATIONS:  No current outpatient medications on file.   No current facility-administered medications for this visit.     REVIEW OF SYSTEMS:   A 10+ POINT REVIEW OF SYSTEMS WAS OBTAINED including neurology, dermatology, psychiatry, cardiac,  respiratory, lymph, extremities, GI, GU, Musculoskeletal, constitutional, breasts, reproductive, HEENT.  All pertinent positives are noted in the HPI.  All others are negative.  PHYSICAL EXAMINATION: ECOG PERFORMANCE STATUS: 1 - Symptomatic but completely ambulatory  . Vitals:   01/03/17 1209  BP: (!) 131/58  Pulse: (!) 56  Resp: 18  Temp: 97.8 F (36.6 C)  SpO2: 100%   Filed Weights   01/03/17 1209  Weight: 204 lb 11.2 oz (92.9 kg)   .Body mass index is 39.98 kg/m.  GENERAL:alert, in no acute distress and comfortable SKIN: no acute rashes, no significant lesions EYES: conjunctiva are pink and non-injected, sclera anicteric OROPHARYNX: MMM, no exudates, no oropharyngeal erythema or ulceration. No signs of thrush.  NECK: supple, no JVD LYMPH:  no palpable lymphadenopathy in the cervical, axillary or inguinal regions LUNGS: clear to auscultation b/l with normal respiratory effort HEART: regular rate & rhythm ABDOMEN:  normoactive bowel sounds, non tender, not distended. Clinically the splenomegaly seems to have regressed and is only palpable 1-2 cms under costal margin. Mild tenderness to deep palpation over the LUQ.  Extremity: no pedal edema PSYCH: alert & oriented x 3 with fluent speech NEURO: no focal motor/sensory deficits  LABORATORY DATA:  I have reviewed the data as listed  . CBC Latest Ref Rng & Units 01/03/2017 10/19/2016 09/21/2016  WBC 3.9 - 10.3 10e3/uL 6.7 5.4 9.5  Hemoglobin 11.6 - 15.9 g/dL 15.0 13.0 14.5  Hematocrit 34.8 - 46.6 % 44.4 39.4 43.9  Platelets 145 - 400 10e3/uL 169 150 107(L)   . CBC    Component Value Date/Time   WBC 6.7 01/03/2017 1138   WBC 5.4 08/13/2016 0902   RBC 5.11 01/03/2017 1138   RBC 4.73 08/13/2016 0902   HGB 15.0 01/03/2017 1138   HCT 44.4 01/03/2017 1138   PLT 169 01/03/2017 1138   MCV 86.9 01/03/2017 1138   MCH 29.4 01/03/2017 1138   MCH 25.8 (L) 08/13/2016 0902   MCHC 33.8 01/03/2017 1138   MCHC 32.9 08/13/2016 0902    RDW 12.9 01/03/2017 1138   LYMPHSABS 1.8 01/03/2017 1138   MONOABS 0.4 01/03/2017 1138   EOSABS 0.2 01/03/2017 1138   BASOSABS 0.0 01/03/2017 1138    . CMP Latest Ref Rng & Units 01/03/2017 10/19/2016 09/21/2016  Glucose 70 - 140 mg/dl 82 66(L) 98  BUN 7.0 - 26.0 mg/dL 21.1 18.1 23.4  Creatinine 0.6 - 1.1 mg/dL 0.8 0.7 0.8  Sodium 136 - 145 mEq/L 139 143 137  Potassium 3.5 - 5.1 mEq/L 4.0 4.0 4.2  CO2 22 - 29 mEq/L 26 28 26   Calcium 8.4 - 10.4 mg/dL 9.3 9.8 9.6  Total Protein 6.4 - 8.3 g/dL 6.8 6.4 6.4  Total Bilirubin 0.20 - 1.20 mg/dL 0.50 0.77 1.06  Alkaline Phos 40 - 150 U/L 132 108 71  AST 5 - 34 U/L 41(H) 21 33  ALT 0 - 55 U/L 67(H) 26 82(H)    Lab Results  Component Value Date   LDH 198 01/03/2017     .Marland KitchenMarland Kitchen  Component     Latest Ref Rng & Units 09/07/2016  Hep B Core Ab, Tot     Negative Negative  Hepatitis B Surface Ag     Negative Negative   Component     Latest Ref Rng & Units 08/10/2016  Sed Rate     0 - 40 mm/hr 12  LDH     125 - 245 U/L 178  EBV VCA IgM     0.0 - 35.9 U/mL <36.0  CMV IgM Ser EIA-aCnc     0.0 - 29.9 AU/mL <30.0  HIV Screen 4th Generation wRfx     Non Reactive Non Reactive  Hep C Virus Ab     0.0 - 0.9 s/co ratio <0.1     RADIOGRAPHIC STUDIES: ABDOMEN ULTRASOUND COMPLETE  COMPARISON:  CT 08/03/2016  FINDINGS: Gallbladder: No gallstones or wall thickening visualized. No sonographic Murphy sign noted by sonographer.  Common bile duct: Diameter: 1.8 mm.  Liver: No focal lesion identified. Within normal limits in parenchymal echogenicity. Portal vein is patent on color Doppler imaging with normal direction of blood flow towards the liver.  IVC: No abnormality visualized.  Pancreas: Visualized portion unremarkable.  Spleen: Mild to moderate splenomegaly as the spleen measures 12.9 cm in greatest diameter, although splenic volume is increased measuring 785 cubic cm.  Right Kidney: Length: 10.1 cm. Echogenicity  within normal limits. No mass or hydronephrosis visualized.  Left Kidney: Length: 10.8 cm. Echogenicity within normal limits. No mass or hydronephrosis visualized.  Abdominal aorta: No aneurysm visualized.  Other findings: None.  IMPRESSION: No acute findings.  Known mild to moderate splenomegaly with splenic volume 785 cubic cm.   Electronically Signed   By: Marin Olp M.D.   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 Diffusely hypermetabolic on PET/CT No evidence of liver disease on CT scan. No evidence of portal hypertension. MPN w/u demonstrated no evidence of genetics mutations to suggest MPN  [place] abd 11/30/2016 - spleen now down to 12.9 cm post Rituxan treatment  #2 Mild thrombocytopenia platelets of 107k. 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 Platelets have normalized completely to 169K.   #3 Indetermine left renal lesion 1.5 cms in size ?complex cyst - will need monitoring.  #5 Severe anxiety- improved since initial treatment.   PLAN -labs today stable with no evidence of lymphoma progression at this time. LDH normalized. No cytopenias. -no overt palpable Lnadenopathy or hepatosplenomegaly. -on indication for additional treatment of her SMZL at this time. Prevnar vaccine today PCV on followup in 3 months RTC with Dr Irene Limbo in 3 months with labs   Orders Placed This Encounter  Procedures  . CBC & Diff and Retic    Standing Status:   Future    Standing Expiration Date:   01/03/2018  . Comprehensive metabolic panel    Standing Status:   Future    Standing Expiration Date:   01/03/2018  . Lactate dehydrogenase    Standing Status:   Future    Standing Expiration Date:   01/03/2018   All of the  patients questions were answered with apparent satisfaction. The patient knows to call the clinic with any problems, questions or concerns.  I spent 20 minutes counseling the patient face to face.  The total time spent in the appointment was 25 minutes and more than 50% was on counseling and direct patient cares.    Sullivan Lone MD [place] AAHIVMS [facility] Hematology/Oncology Physician [facility]  (Office):       [phone] (Work cell):  [phone] (Fax):           [phone]

## 2017-01-28 ENCOUNTER — Encounter (INDEPENDENT_AMBULATORY_CARE_PROVIDER_SITE_OTHER): Payer: Self-pay

## 2017-02-01 DIAGNOSIS — J069 Acute upper respiratory infection, unspecified: Secondary | ICD-10-CM | POA: Diagnosis not present

## 2017-02-05 ENCOUNTER — Other Ambulatory Visit: Payer: BLUE CROSS/BLUE SHIELD

## 2017-02-05 ENCOUNTER — Ambulatory Visit: Payer: BLUE CROSS/BLUE SHIELD | Admitting: Hematology

## 2017-02-06 ENCOUNTER — Other Ambulatory Visit: Payer: BLUE CROSS/BLUE SHIELD

## 2017-02-06 ENCOUNTER — Ambulatory Visit: Payer: BLUE CROSS/BLUE SHIELD | Admitting: Hematology

## 2017-03-08 ENCOUNTER — Telehealth: Payer: Self-pay | Admitting: *Deleted

## 2017-03-08 NOTE — Telephone Encounter (Signed)
FYI "Just found out I need to come in earlier for F/U to begin four treatments as received previously.  This is due to insurance I need to complete these before April 21, 2017."    Previously anti-body plan received Rituxamab Q 7-days x 4.

## 2017-03-11 ENCOUNTER — Encounter: Payer: Self-pay | Admitting: *Deleted

## 2017-03-11 DIAGNOSIS — K1329 Other disturbances of oral epithelium, including tongue: Secondary | ICD-10-CM | POA: Diagnosis not present

## 2017-03-15 DIAGNOSIS — K136 Irritative hyperplasia of oral mucosa: Secondary | ICD-10-CM | POA: Diagnosis not present

## 2017-03-20 NOTE — Progress Notes (Signed)
[name]    HEMATOLOGY/ONCOLOGY CLINIC NOTE  Date of Service: 03/21/17  Patient Care Team: Pa, [place] as PCP - General (Family Medicine)  PCP: [name] MD   CHIEF COMPLAINTS/PURPOSE OF CONSULTATION:  F/u for splenic marginal zone lymphoma  HISTORY OF PRESENTING ILLNESS:   Wendy Avery is a wonderful 62 y.o. female who has been referred to [place] by Dr [facility]  for evaluation and management of possible lymphoma in the setting of massive splenomegaly and abdominal lymphadenopathy.  Patient patient has no significant chronic medical issues. She presented to urgent care with abdominal bloating and distention for 1-2 months. Also had some abdominal discomfort in the left upper quadrant of the abdomen and notes that she could feel a hard mass. She also notes some aches in her chest for 1-2 months and significant progressive fatigue which is starting to affect her quality of life. Over the last month she also notes some drenching night sweats but isn't sure if this is related to menopausal symptoms. Denies any overt weight loss. Patient has history of anxiety and notes severe anxiety over the symptoms.  Patient had a CT of the abdomen and pelvis with contrast on 08/03/2016 to further evaluate her symptoms. This showed massive splenomegaly with the spleen measuring 23.0 x 20.2 x 11.6 cm (volume = 2820 cm^3). Also noted to have concurrent  mild abdominal adenopathy. Incidental findings of Indeterminate left renal lesion. Most likely a minimally complex cyst. A solid neoplasm cannot be excluded.  Patient  notes that she wants to figure out the diagnosis ASAP and is very anxious. Reports no history of alcohol abuse or liver disease.  She has them out of colonoscopy but has had fecal occult blood testing recently which was negative. Last mammogram April 2017 with the normal limits. Last Pap smear August 2017 was within normal limits.  INTERVAL  HISTORY  Wendy Avery is here for her scheduled follow-up for her SMZL The patient's last visit with [place] was on 01/03/17. The pt reports that she is doing well overall and has enjoyed recently joining a fitness center and becoming more active.   Of note since the patient last visit, pt has had oral surgery last week and has been recovering well and has been taking pain medication prn for it.    Lab results today (03/21/17) of CBC, CMP, and Reticulocytes is as follows: all values are WNL. LDH 03/21/17 WNL at 158.  On review of systems, pt reports good energy levels, resolved leg cramps, and denies abdominal pains, mouth sores, leg swelling, and any other symptoms.  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 Marital status: Widowed    Spouse name: Not on file  . Number of children: Not on file  . Years of education: Not on file  . Highest education level: Not on file  Social Needs  . Financial resource strain: Not on file  . Food insecurity - worry: Not on file  . Food insecurity - inability: Not on file  . Transportation needs - medical: Not on file  . Transportation needs - non-medical: Not on file  Occupational History  . Not on  file  Tobacco Use  . Smoking status: Former Smoker    Last attempt to quit: 2010    Years since quitting: 9.1  . Smokeless tobacco: Never Used  Substance and Sexual Activity  . Alcohol use: Yes    Comment: occassional   . Drug use: No  . Sexual activity: Not on file  Other Topics Concern  . Not on file  Social History Narrative  . Not on file  Ex-smoker 1 pack per day quit 30 years ago Uses alcohol  socially She is widowed and has 2 daughters   FAMILY HISTORY: Mom had breast cancer at age 57 years and died from metastatic disease Father skin cancer, vocal cord cancer, alcohol abuse  ALLERGIES:  ?PCN  MEDICATIONS:  No current outpatient medications on file.   No current facility-administered medications for this visit.     REVIEW OF SYSTEMS:    .10 Point review of Systems was done is negative except as noted above.   PHYSICAL EXAMINATION: ECOG PERFORMANCE STATUS: 1 - Symptomatic but completely ambulatory  . Vitals:   03/21/17 1052  BP: (!) 149/63  Pulse: (!) 53  Resp: 18  Temp: 97.6 F (36.4 C)  SpO2: 100%   Filed Weights   03/21/17 1052  Weight: 205 lb 3.2 oz (93.1 kg)   .Body mass index is 40.08 kg/m.  [name] GENERAL:alert, in no acute distress and comfortable SKIN: no acute rashes, no significant lesions EYES: conjunctiva are pink and non-injected, sclera anicteric OROPHARYNX: MMM, no exudates, no oropharyngeal erythema or ulceration NECK: supple, no JVD LYMPH:  no palpable lymphadenopathy in the cervical, axillary or inguinal regions LUNGS: clear to auscultation b/l with normal respiratory effort HEART: regular rate & rhythm ABDOMEN:  normoactive bowel sounds , non tender, not distended. Extremity: no pedal edema PSYCH: alert & oriented x 3 with fluent speech NEURO: no focal motor/sensory deficits    LABORATORY DATA:  I have reviewed the data as listed  . CBC Latest Ref Rng & Units 03/21/2017 01/03/2017 10/19/2016  WBC 3.9 - 10.3 K/uL 5.9 6.7 5.4  Hemoglobin 11.6 - 15.9 g/dL - 15.0 13.0  Hematocrit 34.8 - 46.6 % 44.7 44.4 39.4  Platelets 145 - 400 K/uL 152 169 150  HGB 14.8 . CBC    Component Value Date/Time   WBC 5.9 03/21/2017 0941   WBC 6.7 01/03/2017 1138   WBC 5.4 08/13/2016 0902   RBC 5.13 03/21/2017 0941   RBC 5.13 03/21/2017 0941   HGB 15.0 01/03/2017 1138   HCT 44.7 03/21/2017 0941   HCT 44.4 01/03/2017 1138   PLT 152 03/21/2017  0941   PLT 169 01/03/2017 1138   MCV 87.1 03/21/2017 0941   MCV 86.9 01/03/2017 1138   MCH 28.8 03/21/2017 0941   MCHC 33.1 03/21/2017 0941   RDW 13.6 03/21/2017 0941   RDW 12.9 01/03/2017 1138   LYMPHSABS 1.7 03/21/2017 0941   LYMPHSABS 1.8 01/03/2017 1138   MONOABS 0.4 03/21/2017 0941   MONOABS 0.4 01/03/2017 1138   EOSABS 0.2 03/21/2017 0941   EOSABS 0.2 01/03/2017 1138   BASOSABS 0.0 03/21/2017 0941   BASOSABS 0.0 01/03/2017 1138    . CMP Latest Ref Rng & Units 03/21/2017 01/03/2017 10/19/2016  Glucose 70 - 140 mg/dL 100 82 66(L)  BUN 7 - 26 mg/dL 16 21.1 18.1  Creatinine 0.60 - 1.10 mg/dL 0.78 0.8 0.7  Sodium 136 - 145 mmol/L 142 139 143  Potassium 3.5 - 5.1 mmol/L 4.0 4.0 4.0  Chloride 98 -  109 mmol/L 105 - -  CO2 22 - 29 mmol/L 28 26 28   Calcium 8.4 - 10.4 mg/dL 9.7 9.3 9.8  Total Protein 6.4 - 8.3 g/dL 6.7 6.8 6.4  Total Bilirubin 0.2 - 1.2 mg/dL 0.8 0.50 0.77  Alkaline Phos 40 - 150 U/L 124 132 108  AST 5 - 34 U/L 29 41(H) 21  ALT 0 - 55 U/L 51 67(H) 26    Lab Results  Component Value Date   LDH 158 03/21/2017     ...   Component     Latest Ref Rng & Units 09/07/2016  Hep B Core Ab, Tot     Negative Negative  Hepatitis B Surface Ag     Negative Negative   Component     Latest Ref Rng & Units 08/10/2016  Sed Rate     0 - 40 mm/hr 12  LDH     125 - 245 U/L 178  EBV VCA IgM     0.0 - 35.9 U/mL <36.0  CMV IgM Ser EIA-aCnc     0.0 - 29.9 AU/mL <30.0  HIV Screen 4th Generation wRfx     Non Reactive Non Reactive  Hep C Virus Ab     0.0 - 0.9 s/co ratio <0.1     RADIOGRAPHIC STUDIES: ABDOMEN ULTRASOUND COMPLETE  COMPARISON:  CT 08/03/2016  FINDINGS: Gallbladder: No gallstones or wall thickening visualized. No sonographic Murphy sign noted by sonographer.  Common bile duct: Diameter: 1.8 mm.  Liver: No focal lesion identified. Within normal limits in parenchymal echogenicity. Portal vein is patent on color Doppler imaging with normal  direction of blood flow towards the liver.  IVC: No abnormality visualized.  Pancreas: Visualized portion unremarkable.  Spleen: Mild to moderate splenomegaly as the spleen measures 12.9 cm in greatest diameter, although splenic volume is increased measuring 785 cubic cm.  Right Kidney: Length: 10.1 cm. Echogenicity within normal limits. No mass or hydronephrosis visualized.  Left Kidney: Length: 10.8 cm. Echogenicity within normal limits. No mass or hydronephrosis visualized.  Abdominal aorta: No aneurysm visualized.  Other findings: None.  IMPRESSION: No acute findings.  Known mild to moderate splenomegaly with splenic volume 785 cubic cm.   Electronically Signed   By: Marin Olp M.D.   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 Diffusely hypermetabolic on PET/CT No evidence of liver disease on CT scan. No evidence of portal hypertension. MPN w/u demonstrated no evidence of genetics mutations to suggest MPN  [place] abd 11/30/2016 - spleen now down to 12.9 cm post Rituxan treatment  #2 Mild thrombocytopenia platelets of 107k. 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 Platelets have normalized completely to 152K.   #3 Indetermine left renal lesion 1.5 cms in size ?complex cyst - will need monitoring.  #5 Severe anxiety- improved since initial treatment.   PLAN -Discussed pt labwork today; all values WNL including LDH at 158  - no lab or clinical indication of progression or return of her SMZL. -No palpable splenomegaly -No indication for additional treatment for her SMZL at this time.  -Encouraged pt to continue eating well and exercising regularly.   RTC with Dr Irene Limbo in 3 months with  labs   Orders Placed This Encounter  Procedures  .  CBC & Diff and Retic    Standing Status:   Future    Standing Expiration Date:   03/21/2018  . CMP ([place] only)    Standing Status:   Future    Standing Expiration Date:   03/21/2018  . Lactate dehydrogenase    Standing Status:   Future    Standing Expiration Date:   03/21/2018   All of the patients questions were answered with apparent satisfaction. The patient knows to call the clinic with any problems, questions or concerns.  . The total time spent in the appointment was 15 minutes and more than 50% was on counseling and direct patient cares.    Sullivan Lone MD [place] AAHIVMS [facility] [place] Hematology/Oncology Physician [facility]  (Office):       [phone] (Work cell):  [phone] (Fax):           [phone]  This document serves as a record of services personally performed by Sullivan Lone, MD. It was created on his behalf by Baldwin Jamaica, a trained medical scribe. The creation of this record is based on the scribe's personal observations and the provider's statements to them.   .I have reviewed the above documentation for accuracy and completeness, and I agree with the above. Brunetta Genera MD MS

## 2017-03-21 ENCOUNTER — Inpatient Hospital Stay (HOSPITAL_BASED_OUTPATIENT_CLINIC_OR_DEPARTMENT_OTHER): Payer: BLUE CROSS/BLUE SHIELD | Admitting: Hematology

## 2017-03-21 ENCOUNTER — Inpatient Hospital Stay: Payer: BLUE CROSS/BLUE SHIELD

## 2017-03-21 ENCOUNTER — Telehealth: Payer: Self-pay | Admitting: Hematology

## 2017-03-21 ENCOUNTER — Inpatient Hospital Stay: Payer: BLUE CROSS/BLUE SHIELD | Attending: Hematology

## 2017-03-21 ENCOUNTER — Encounter: Payer: Self-pay | Admitting: Hematology

## 2017-03-21 VITALS — BP 149/63 | HR 53 | Temp 97.6°F | Resp 18 | Ht 60.0 in | Wt 205.2 lb

## 2017-03-21 DIAGNOSIS — Z23 Encounter for immunization: Secondary | ICD-10-CM | POA: Diagnosis not present

## 2017-03-21 DIAGNOSIS — C8307 Small cell B-cell lymphoma, spleen: Secondary | ICD-10-CM

## 2017-03-21 DIAGNOSIS — N289 Disorder of kidney and ureter, unspecified: Secondary | ICD-10-CM | POA: Diagnosis not present

## 2017-03-21 DIAGNOSIS — K136 Irritative hyperplasia of oral mucosa: Secondary | ICD-10-CM | POA: Diagnosis not present

## 2017-03-21 DIAGNOSIS — F419 Anxiety disorder, unspecified: Secondary | ICD-10-CM

## 2017-03-21 LAB — CBC WITH DIFFERENTIAL (CANCER CENTER ONLY)
BASOS ABS: 0 10*3/uL (ref 0.0–0.1)
BASOS PCT: 0 %
Eosinophils Absolute: 0.2 10*3/uL (ref 0.0–0.5)
Eosinophils Relative: 3 %
HEMATOCRIT: 44.7 % (ref 34.8–46.6)
HEMOGLOBIN: 14.8 g/dL (ref 11.6–15.9)
Lymphocytes Relative: 28 %
Lymphs Abs: 1.7 10*3/uL (ref 0.9–3.3)
MCH: 28.8 pg (ref 25.1–34.0)
MCHC: 33.1 g/dL (ref 31.5–36.0)
MCV: 87.1 fL (ref 79.5–101.0)
Monocytes Absolute: 0.4 10*3/uL (ref 0.1–0.9)
Monocytes Relative: 7 %
NEUTROS ABS: 3.6 10*3/uL (ref 1.5–6.5)
NEUTROS PCT: 62 %
Platelet Count: 152 10*3/uL (ref 145–400)
RBC: 5.13 MIL/uL (ref 3.70–5.45)
RDW: 13.6 % (ref 11.2–14.5)
WBC: 5.9 10*3/uL (ref 3.9–10.3)

## 2017-03-21 LAB — COMPREHENSIVE METABOLIC PANEL
ALK PHOS: 124 U/L (ref 40–150)
ALT: 51 U/L (ref 0–55)
ANION GAP: 9 (ref 3–11)
AST: 29 U/L (ref 5–34)
Albumin: 4 g/dL (ref 3.5–5.0)
BILIRUBIN TOTAL: 0.8 mg/dL (ref 0.2–1.2)
BUN: 16 mg/dL (ref 7–26)
CALCIUM: 9.7 mg/dL (ref 8.4–10.4)
CO2: 28 mmol/L (ref 22–29)
Chloride: 105 mmol/L (ref 98–109)
Creatinine, Ser: 0.78 mg/dL (ref 0.60–1.10)
GFR calc non Af Amer: >60 mL/min (ref >60)
Glucose, Bld: 100 mg/dL (ref 70–140)
Potassium: 4 mmol/L (ref 3.5–5.1)
SODIUM: 142 mmol/L (ref 136–145)
TOTAL PROTEIN: 6.7 g/dL (ref 6.4–8.3)

## 2017-03-21 LAB — LACTATE DEHYDROGENASE: LDH: 158 U/L (ref 125–245)

## 2017-03-21 LAB — RETICULOCYTES
RBC.: 5.13 MIL/uL (ref 3.70–5.45)
RETIC COUNT ABSOLUTE: 71.8 10*3/uL (ref 33.7–90.7)
Retic Ct Pct: 1.4 % (ref 0.7–2.1)

## 2017-03-21 MED ORDER — PNEUMOCOCCAL VAC POLYVALENT 25 MCG/0.5ML IJ INJ
0.5000 mL | INJECTION | Freq: Once | INTRAMUSCULAR | Status: AC
  Administered 2017-03-21: 0.5 mL via INTRAMUSCULAR
  Filled 2017-03-21: qty 0.5

## 2017-03-21 MED ORDER — PNEUMOCOCCAL VAC POLYVALENT 25 MCG/0.5ML IJ INJ
0.5000 mL | INJECTION | Freq: Once | INTRAMUSCULAR | Status: DC
  Filled 2017-03-21: qty 0.5

## 2017-03-21 NOTE — Patient Instructions (Signed)

## 2017-03-21 NOTE — Telephone Encounter (Signed)
Scheduled appt per 2/28 los - Gave patient AVS and calender per los.  

## 2017-04-03 ENCOUNTER — Other Ambulatory Visit: Payer: BLUE CROSS/BLUE SHIELD

## 2017-04-03 ENCOUNTER — Ambulatory Visit: Payer: BLUE CROSS/BLUE SHIELD | Admitting: Hematology

## 2017-04-03 ENCOUNTER — Ambulatory Visit: Payer: BLUE CROSS/BLUE SHIELD

## 2017-04-09 DIAGNOSIS — J309 Allergic rhinitis, unspecified: Secondary | ICD-10-CM | POA: Diagnosis not present

## 2017-04-09 DIAGNOSIS — C8580 Other specified types of non-Hodgkin lymphoma, unspecified site: Secondary | ICD-10-CM | POA: Diagnosis not present

## 2017-06-05 ENCOUNTER — Telehealth: Payer: Self-pay

## 2017-06-05 NOTE — Telephone Encounter (Signed)
Called to verfy her upcoming appointment. Mailed calender as a second reminder. Per 5/15 phone que

## 2017-06-12 NOTE — Progress Notes (Signed)
[name]    HEMATOLOGY/ONCOLOGY CLINIC NOTE  Date of Service: 06/13/17  Patient Care Team: Pa, [place] as PCP - General (Family Medicine)  PCP: [name] MD   CHIEF COMPLAINTS/PURPOSE OF CONSULTATION:  F/u for splenic marginal zone lymphoma  HISTORY OF PRESENTING ILLNESS:   Wendy Avery is a wonderful 62 y.o. female who has been referred to [place] by [facility]  for evaluation and management of possible lymphoma in the setting of massive splenomegaly and abdominal lymphadenopathy.  Patient patient has no significant chronic medical issues. She presented to urgent care with abdominal bloating and distention for 1-2 months. Also had some abdominal discomfort in the left upper quadrant of the abdomen and notes that she could feel a hard mass. She also notes some aches in her chest for 1-2 months and significant progressive fatigue which is starting to affect her quality of life. Over the last month she also notes some drenching night sweats but isn't sure if this is related to menopausal symptoms. Denies any overt weight loss. Patient has history of anxiety and notes severe anxiety over the symptoms.  Patient had a CT of the abdomen and pelvis with contrast on 08/03/2016 to further evaluate her symptoms. This showed massive splenomegaly with the spleen measuring 23.0 x 20.2 x 11.6 cm (volume = 2820 cm^3). Also noted to have concurrent  mild abdominal adenopathy. Incidental findings of Indeterminate left renal lesion. Most likely a minimally complex cyst. A solid neoplasm cannot be excluded.  Patient  notes that she wants to figure out the diagnosis ASAP and is very anxious. Reports no history of alcohol abuse or liver disease.  She has them out of colonoscopy but has had fecal occult blood testing recently which was negative. Last mammogram April 2017 with the normal limits. Last Pap smear August 2017 was within normal limits.  INTERVAL  HISTORY  Wendy Avery is here for her scheduled follow-up for her SMZL. The patient's last visit with [place] was on 03/21/17. The pt reports that she is doing well overall.   The pt reports that she has recently joined a fitness center and has been feeling healthier and stronger. She notes that she has been having a more stressful time at work and is thankful for her gym membership as a positive outlet for her stress.   She notes that she had a recent infection with tooth decay and bone loss in her jaw which has been treated. She was treated with an antibiotic and complete the course last week.   Lab results today (06/13/17) of CBC and Reticulocytes is as follows: all values are WNL except for Platelets at 130k. CMP 06/13/17 is WNL.  LDH 06/13/17 is WNL at 179  On review of systems, pt reports recent upper resp infection, exercise-related soreness, and denies fevers, chills, night sweats, unexpected weight loss, abdominal pains, leg swellin.    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 Marital status: Widowed    Spouse name: Not on file  . Number of children: Not on file  . Years of education: Not on file  . Highest education level: Not on file  Occupational History  . Not on  file  Social Needs  . Financial resource strain: Not on file  . Food insecurity:    Worry: Not on file    Inability: Not on file  . Transportation needs:    Medical: Not on file    Non-medical: Not on file  Tobacco Use  . Smoking status: Former Smoker    Last attempt to quit: 2010    Years since quitting: 9.3  . Smokeless tobacco: Never Used  Substance and Sexual Activity  . Alcohol use: Yes    Comment:  occassional   . Drug use: No  . Sexual activity: Not on file  Lifestyle  . Physical activity:    Days per week: Not on file    Minutes per session: Not on file  . Stress: Not on file  Relationships  . Social connections:    Talks on phone: Not on file    Gets together: Not on file    Attends religious service: Not on file    Active member of club or organization: Not on file    Attends meetings of clubs or organizations: Not on file    Relationship status: Not on file  . Intimate partner violence:    Fear of current or ex partner: Not on file    Emotionally abused: Not on file    Physically abused: Not on file    Forced sexual activity: Not on file  Other Topics Concern  . Not on file  Social History Narrative  . Not on file  Ex-smoker 1 pack per day quit 30 years ago Uses alcohol socially She is widowed and has 2 daughters   FAMILY HISTORY: Mom had breast cancer at age 63 years and died from metastatic disease Father skin cancer, vocal cord cancer, alcohol abuse  ALLERGIES:  ?PCN  MEDICATIONS:  No current outpatient medications on file.   No current facility-administered medications for this visit.     REVIEW OF SYSTEMS:    A 10+ POINT REVIEW OF SYSTEMS WAS OBTAINED including neurology, dermatology, psychiatry, cardiac, respiratory, lymph, extremities, GI, GU, Musculoskeletal, constitutional, breasts, reproductive, HEENT.  All pertinent positives are noted in the HPI.  All others are negative.     PHYSICAL EXAMINATION: ECOG PERFORMANCE STATUS: 1 - Symptomatic but completely ambulatory  . Vitals:   06/13/17 1051  BP: 129/68  Pulse: (!) 55  Resp: 18  Temp: 97.8 F (36.6 C)  SpO2: 100%   Filed Weights   06/13/17 1051  Weight: 206 lb 11.2 oz (93.8 kg)   .Body mass index is 40.37 kg/m.  GENERAL:alert, in no acute distress and comfortable SKIN: no acute rashes, no significant lesions EYES: conjunctiva are pink and non-injected, sclera  anicteric OROPHARYNX: MMM, no exudates, no oropharyngeal erythema or ulceration NECK: supple, no JVD LYMPH:  no palpable lymphadenopathy in the cervical, axillary or inguinal regions LUNGS: clear to auscultation b/l with normal respiratory effort HEART: regular rate & rhythm ABDOMEN:  normoactive bowel sounds , non tender, not distended. Extremity: no pedal edema PSYCH: alert & oriented x 3 with fluent speech NEURO: no focal motor/sensory deficits    LABORATORY DATA:  I have reviewed the data as listed  . CBC Latest Ref Rng & Units 06/13/2017 03/21/2017 01/03/2017  WBC 3.9 - 10.3 K/uL 5.6 5.9 6.7  Hemoglobin 11.6 - 15.9 g/dL 14.4 14.8 15.0  Hematocrit 34.8 - 46.6 % 43.0 44.7 44.4  Platelets 145 - 400 K/uL 130(L) 152 169  HGB 14.8 . CBC    Component  Value Date/Time   WBC 5.6 06/13/2017 0950   WBC 6.7 01/03/2017 1138   WBC 5.4 08/13/2016 0902   RBC 4.98 06/13/2017 0950   RBC 4.98 06/13/2017 0950   HGB 14.4 06/13/2017 0950   HGB 15.0 01/03/2017 1138   HCT 43.0 06/13/2017 0950   HCT 44.4 01/03/2017 1138   PLT 130 (L) 06/13/2017 0950   PLT 169 01/03/2017 1138   MCV 86.3 06/13/2017 0950   MCV 86.9 01/03/2017 1138   MCH 28.9 06/13/2017 0950   MCHC 33.5 06/13/2017 0950   RDW 13.3 06/13/2017 0950   RDW 12.9 01/03/2017 1138   LYMPHSABS 1.6 06/13/2017 0950   LYMPHSABS 1.8 01/03/2017 1138   MONOABS 0.4 06/13/2017 0950   MONOABS 0.4 01/03/2017 1138   EOSABS 0.2 06/13/2017 0950   EOSABS 0.2 01/03/2017 1138   BASOSABS 0.0 06/13/2017 0950   BASOSABS 0.0 01/03/2017 1138    . CMP Latest Ref Rng & Units 06/13/2017 03/21/2017 01/03/2017  Glucose 70 - 140 mg/dL 92 100 82  BUN 7 - 26 mg/dL 16 16 21.1  Creatinine 0.60 - 1.10 mg/dL 0.72 0.78 0.8  Sodium 136 - 145 mmol/L 140 142 139  Potassium 3.5 - 5.1 mmol/L 4.1 4.0 4.0  Chloride 98 - 109 mmol/L 109 105 -  CO2 22 - 29 mmol/L 26 28 26   Calcium 8.4 - 10.4 mg/dL 9.1 9.7 9.3  Total Protein 6.4 - 8.3 g/dL 6.4 6.7 6.8  Total Bilirubin  0.2 - 1.2 mg/dL 0.7 0.8 0.50  Alkaline Phos 40 - 150 U/L 117 124 132  AST 5 - 34 U/L 23 29 41(H)  ALT 0 - 55 U/L 44 51 67(H)    Lab Results  Component Value Date   LDH 158 03/21/2017     ...   Component     Latest Ref Rng & Units 09/07/2016  Hep B Core Ab, Tot     Negative Negative  Hepatitis B Surface Ag     Negative Negative   Component     Latest Ref Rng & Units 08/10/2016  Sed Rate     0 - 40 mm/hr 12  LDH     125 - 245 U/L 178  EBV VCA IgM     0.0 - 35.9 U/mL <36.0  CMV IgM Ser EIA-aCnc     0.0 - 29.9 AU/mL <30.0  HIV Screen 4th Generation wRfx     Non Reactive Non Reactive  Hep C Virus Ab     0.0 - 0.9 s/co ratio <0.1     RADIOGRAPHIC STUDIES: ABDOMEN ULTRASOUND COMPLETE  COMPARISON:  CT 08/03/2016  FINDINGS: Gallbladder: No gallstones or wall thickening visualized. No sonographic Murphy sign noted by sonographer.  Common bile duct: Diameter: 1.8 mm.  Liver: No focal lesion identified. Within normal limits in parenchymal echogenicity. Portal vein is patent on color Doppler imaging with normal direction of blood flow towards the liver.  IVC: No abnormality visualized.  Pancreas: Visualized portion unremarkable.  Spleen: Mild to moderate splenomegaly as the spleen measures 12.9 cm in greatest diameter, although splenic volume is increased measuring 785 cubic cm.  Right Kidney: Length: 10.1 cm. Echogenicity within normal limits. No mass or hydronephrosis visualized.  Left Kidney: Length: 10.8 cm. Echogenicity within normal limits. No mass or hydronephrosis visualized.  Abdominal aorta: No aneurysm visualized.  Other findings: None.  IMPRESSION: No acute findings.  Known mild to moderate splenomegaly with splenic volume 785 cubic cm.   Electronically Signed   By: Quillian Quince  Derrel Nip M.D.   On: 11/30/2016 10:09   ASSESSMENT & PLAN:   62 year old Caucasian female with  #1 Stage IV Splenic Marginal Zone lymphoma currently  in remission  Initially presented with Massive splenomegaly with the spleen measuring 23.0 x 20.2 x 11.6 cm (volume = 2820 cm^3). Diffusely hypermetabolic on PET/CT No evidence of liver disease on CT scan. No evidence of portal hypertension. MPN w/u demonstrated no evidence of genetics mutations to suggest MPN  [place] abd 11/30/2016 - spleen now down to 12.9 cm post Rituxan treatment  #2 Mild thrombocytopenia platelets of 107k. Likely due to the [place] and from hypersplenism in the setting of massive splenomegaly. Platelets had normalized completely to 152K.  plt today at 130k - slightly lower in the setting of recent URI  #3 Indetermine left renal lesion 1.5 cms in size ?complex cyst - will need monitoring. No reports of this on [place] ab in 11/2016.  #5 Severe anxiety- improved since initial treatment.   PLAN -Discussed pt labwork today, 06/13/17; platelets borderline low at 130k, no increased lymphocytes # in CBC -Continue staying active  -No palpable splenomegaly -The pt shows no clinical or lab evidence of progression of SMZL at this time.  -Will see pt back in 3 months   RTC with Dr Irene Limbo in 3 months with labs    Orders Placed This Encounter  Procedures  . CBC with Differential/Platelet    Standing Status:   Future    Standing Expiration Date:   07/18/2018  . CMP ([place] only)    Standing Status:   Future    Standing Expiration Date:   06/14/2018  . Lactate dehydrogenase    Standing Status:   Future    Standing Expiration Date:   06/14/2018  . Reticulocytes    Standing Status:   Future    Standing Expiration Date:   06/14/2018   All of the patients questions were answered with apparent satisfaction. The patient knows to call the clinic with any problems, questions or concerns.  . The total time spent in the appointment was 20 minutes and more than 50% was on counseling and direct patient cares.   Sullivan Lone MD [place] AAHIVMS [facility] [place] Hematology/Oncology Physician [facility]  (Office):       [phone] (Work cell):  [phone] (Fax):           [phone]  This document serves as a record of services personally performed by Sullivan Lone, MD. It was created on his behalf by Baldwin Jamaica, a trained medical scribe. The creation of this record is based on the scribe's personal observations and the provider's statements to them.   .I have reviewed the above documentation for accuracy and completeness, and I agree with the above. Brunetta Genera MD MS

## 2017-06-13 ENCOUNTER — Inpatient Hospital Stay: Payer: BLUE CROSS/BLUE SHIELD

## 2017-06-13 ENCOUNTER — Telehealth: Payer: Self-pay

## 2017-06-13 ENCOUNTER — Inpatient Hospital Stay: Payer: BLUE CROSS/BLUE SHIELD | Attending: Hematology | Admitting: Hematology

## 2017-06-13 VITALS — BP 129/68 | HR 55 | Temp 97.8°F | Resp 18 | Ht 60.0 in | Wt 206.7 lb

## 2017-06-13 DIAGNOSIS — F419 Anxiety disorder, unspecified: Secondary | ICD-10-CM | POA: Diagnosis not present

## 2017-06-13 DIAGNOSIS — Z87891 Personal history of nicotine dependence: Secondary | ICD-10-CM

## 2017-06-13 DIAGNOSIS — C8307 Small cell B-cell lymphoma, spleen: Secondary | ICD-10-CM

## 2017-06-13 DIAGNOSIS — N289 Disorder of kidney and ureter, unspecified: Secondary | ICD-10-CM | POA: Diagnosis not present

## 2017-06-13 DIAGNOSIS — D696 Thrombocytopenia, unspecified: Secondary | ICD-10-CM

## 2017-06-13 LAB — CBC WITH DIFFERENTIAL (CANCER CENTER ONLY)
Basophils Absolute: 0 10*3/uL (ref 0.0–0.1)
Basophils Relative: 0 %
EOS PCT: 3 %
Eosinophils Absolute: 0.2 10*3/uL (ref 0.0–0.5)
HCT: 43 % (ref 34.8–46.6)
Hemoglobin: 14.4 g/dL (ref 11.6–15.9)
LYMPHS ABS: 1.6 10*3/uL (ref 0.9–3.3)
LYMPHS PCT: 28 %
MCH: 28.9 pg (ref 25.1–34.0)
MCHC: 33.5 g/dL (ref 31.5–36.0)
MCV: 86.3 fL (ref 79.5–101.0)
MONOS PCT: 6 %
Monocytes Absolute: 0.4 10*3/uL (ref 0.1–0.9)
Neutro Abs: 3.5 10*3/uL (ref 1.5–6.5)
Neutrophils Relative %: 63 %
PLATELETS: 130 10*3/uL — AB (ref 145–400)
RBC: 4.98 MIL/uL (ref 3.70–5.45)
RDW: 13.3 % (ref 11.2–14.5)
WBC Count: 5.6 10*3/uL (ref 3.9–10.3)

## 2017-06-13 LAB — RETICULOCYTES
RBC.: 4.98 MIL/uL (ref 3.70–5.45)
Retic Count, Absolute: 69.7 10*3/uL (ref 33.7–90.7)
Retic Ct Pct: 1.4 % (ref 0.7–2.1)

## 2017-06-13 LAB — CMP (CANCER CENTER ONLY)
ALBUMIN: 4.1 g/dL (ref 3.5–5.0)
ALT: 44 U/L (ref 0–55)
AST: 23 U/L (ref 5–34)
Alkaline Phosphatase: 117 U/L (ref 40–150)
Anion gap: 5 (ref 3–11)
BUN: 16 mg/dL (ref 7–26)
CHLORIDE: 109 mmol/L (ref 98–109)
CO2: 26 mmol/L (ref 22–29)
Calcium: 9.1 mg/dL (ref 8.4–10.4)
Creatinine: 0.72 mg/dL (ref 0.60–1.10)
GFR, Est AFR Am: >60 mL/min (ref >60)
GFR, Estimated: >60 mL/min (ref >60)
GLUCOSE: 92 mg/dL (ref 70–140)
POTASSIUM: 4.1 mmol/L (ref 3.5–5.1)
SODIUM: 140 mmol/L (ref 136–145)
Total Bilirubin: 0.7 mg/dL (ref 0.2–1.2)
Total Protein: 6.4 g/dL (ref 6.4–8.3)

## 2017-06-13 LAB — LACTATE DEHYDROGENASE: LDH: 179 U/L (ref 125–245)

## 2017-06-13 NOTE — Telephone Encounter (Signed)
Printed avs and calender of upcoming appointment. Per 5/23 los 

## 2017-06-14 ENCOUNTER — Encounter: Payer: Self-pay | Admitting: Hematology

## 2017-09-12 NOTE — Progress Notes (Signed)
[name]    HEMATOLOGY/ONCOLOGY CLINIC NOTE  Date of Service: 06/13/17  Patient Care Team: Pa, [place] as PCP - General (Family Medicine)  PCP: [name] MD   CHIEF COMPLAINTS/PURPOSE OF CONSULTATION:  F/u for splenic marginal zone lymphoma  HISTORY OF PRESENTING ILLNESS:   Wendy Avery is a wonderful 62 y.o. female who has been referred to [place] by Dr [facility]  for evaluation and management of possible lymphoma in the setting of massive splenomegaly and abdominal lymphadenopathy.  Patient patient has no significant chronic medical issues. She presented to urgent care with abdominal bloating and distention for 1-2 months. Also had some abdominal discomfort in the left upper quadrant of the abdomen and notes that she could feel a hard mass. She also notes some aches in her chest for 1-2 months and significant progressive fatigue which is starting to affect her quality of life. Over the last month she also notes some drenching night sweats but isn't sure if this is related to menopausal symptoms. Denies any overt weight loss. Patient has history of anxiety and notes severe anxiety over the symptoms.  Patient had a CT of the abdomen and pelvis with contrast on 08/03/2016 to further evaluate her symptoms. This showed massive splenomegaly with the spleen measuring 23.0 x 20.2 x 11.6 cm (volume = 2820 cm^3). Also noted to have concurrent  mild abdominal adenopathy. Incidental findings of Indeterminate left renal lesion. Most likely a minimally complex cyst. A solid neoplasm cannot be excluded.  Patient  notes that she wants to figure out the diagnosis ASAP and is very anxious. Reports no history of alcohol abuse or liver disease.  She has them out of colonoscopy but has had fecal occult blood testing recently which was negative. Last mammogram April 2017 with the normal limits. Last Pap smear August 2017 was within normal limits.  INTERVAL  HISTORY  Wendy Avery is here for her scheduled follow-up for her SMZL. The patient's last visit with [place] was on 06/13/17. She is accompanied today by her daughter. The pt reports that she is doing well overall.   The pt reports that since her last visit she had extensive dental work up and developed an infection about 1-2 months ago, and took an antibiotic.   She notes that she has not had any new constitutional symptoms in the interim. She has continued to go to the gym twice a week and endorses good energy levels.   Lab results today (09/13/17) of CBC w/diff, CMP, and Reticulocytes is as follows: all values are WNL except for PLT at 110k, Total Protein at 6.2. LDH 09/13/17 is WNL at 149  On review of systems, pt reports good energy levels, staying active, and denies fevers, chills, night sweats, unexpected weight loss, abdominal pains, leg swelling, and any other symptoms.    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 Marital status: Widowed    Spouse name: Not on file  . Number of children: Not on file  . Years of education: Not on file  . Highest education level: Not on file  Occupational History  . Not on file  Social Needs  . Financial resource strain: Not on  file  . Food insecurity:    Worry: Not on file    Inability: Not on file  . Transportation needs:    Medical: Not on file    Non-medical: Not on file  Tobacco Use  . Smoking status: Former Smoker    Last attempt to quit: 2010    Years since quitting: 9.6  . Smokeless tobacco: Never Used  Substance and Sexual Activity  . Alcohol use: Yes    Comment: occassional   . Drug use: No  . Sexual activity: Not on file    Lifestyle  . Physical activity:    Days per week: Not on file    Minutes per session: Not on file  . Stress: Not on file  Relationships  . Social connections:    Talks on phone: Not on file    Gets together: Not on file    Attends religious service: Not on file    Active member of club or organization: Not on file    Attends meetings of clubs or organizations: Not on file    Relationship status: Not on file  . Intimate partner violence:    Fear of current or ex partner: Not on file    Emotionally abused: Not on file    Physically abused: Not on file    Forced sexual activity: Not on file  Other Topics Concern  . Not on file  Social History Narrative  . Not on file  Ex-smoker 1 pack per day quit 30 years ago Uses alcohol socially She is widowed and has 2 daughters   FAMILY HISTORY: Mom had breast cancer at age 80 years and died from metastatic disease Father skin cancer, vocal cord cancer, alcohol abuse  ALLERGIES:  ?PCN  MEDICATIONS:  No current outpatient medications on file.   No current facility-administered medications for this visit.     REVIEW OF SYSTEMS:    A 10+ POINT REVIEW OF SYSTEMS WAS OBTAINED including neurology, dermatology, psychiatry, cardiac, respiratory, lymph, extremities, GI, GU, Musculoskeletal, constitutional, breasts, reproductive, HEENT.  All pertinent positives are noted in the HPI.  All others are negative.    PHYSICAL EXAMINATION: ECOG PERFORMANCE STATUS: 1 - Symptomatic but completely ambulatory  . Vitals:   09/13/17 1108 09/13/17 1109  BP: (!) 141/48 (!) 151/70  Pulse: (!) 55   Resp: 18   Temp: 98.9 F (37.2 C)   SpO2: 99%    Filed Weights   09/13/17 1108  Weight: 209 lb 3.2 oz (94.9 kg)   .Body mass index is 39.53 kg/m.  GENERAL:alert, in no acute distress and comfortable SKIN: no acute rashes, no significant lesions EYES: conjunctiva are pink and non-injected, sclera anicteric OROPHARYNX: MMM, no exudates, no  oropharyngeal erythema or ulceration NECK: supple, no JVD LYMPH:  no palpable lymphadenopathy in the cervical, axillary or inguinal regions LUNGS: clear to auscultation b/l with normal respiratory effort HEART: regular rate & rhythm ABDOMEN:  normoactive bowel sounds , non tender, not distended. No palpable hepatosplenomegaly.  Extremity: no pedal edema PSYCH: alert & oriented x 3 with fluent speech NEURO: no focal motor/sensory deficits    LABORATORY DATA:  I have reviewed the data as listed  . CBC Latest Ref Rng & Units 09/13/2017 06/13/2017 03/21/2017  WBC 3.9 - 10.3 K/uL 5.2 5.6 5.9  Hemoglobin 11.6 - 15.9 g/dL 14.1 14.4 14.8  Hematocrit 34.8 - 46.6 % 42.1 43.0 44.7  Platelets 145 - 400 K/uL 110(L) 130(L) 152  HGB 14.8 . CBC  Component Value Date/Time   WBC 5.2 09/13/2017 0926   RBC 4.89 09/13/2017 0926   RBC 4.89 09/13/2017 0926   HGB 14.1 09/13/2017 0926   HGB 14.4 06/13/2017 0950   HGB 15.0 01/03/2017 1138   HCT 42.1 09/13/2017 0926   HCT 44.4 01/03/2017 1138   PLT 110 (L) 09/13/2017 0926   PLT 130 (L) 06/13/2017 0950   PLT 169 01/03/2017 1138   MCV 86.1 09/13/2017 0926   MCV 86.9 01/03/2017 1138   MCH 28.8 09/13/2017 0926   MCHC 33.5 09/13/2017 0926   RDW 13.2 09/13/2017 0926   RDW 12.9 01/03/2017 1138   LYMPHSABS 1.6 09/13/2017 0926   LYMPHSABS 1.8 01/03/2017 1138   MONOABS 0.4 09/13/2017 0926   MONOABS 0.4 01/03/2017 1138   EOSABS 0.1 09/13/2017 0926   EOSABS 0.2 01/03/2017 1138   BASOSABS 0.0 09/13/2017 0926   BASOSABS 0.0 01/03/2017 1138    . CMP Latest Ref Rng & Units 09/13/2017 06/13/2017 03/21/2017  Glucose 70 - 99 mg/dL 93 92 100  BUN 6 - 20 mg/dL 20 16 16   Creatinine 0.44 - 1.00 mg/dL 0.74 0.72 0.78  Sodium 135 - 145 mmol/L 141 140 142  Potassium 3.5 - 5.1 mmol/L 4.4 4.1 4.0  Chloride 98 - 111 mmol/L 107 109 105  CO2 22 - 32 mmol/L 27 26 28   Calcium 8.9 - 10.3 mg/dL 9.1 9.1 9.7  Total Protein 6.5 - 8.1 g/dL 6.2(L) 6.4 6.7  Total Bilirubin 0.3  - 1.2 mg/dL 0.9 0.7 0.8  Alkaline Phos 38 - 126 U/L 104 117 124  AST 15 - 41 U/L 25 23 29   ALT 0 - 44 U/L 38 44 51    Lab Results  Component Value Date   LDH 149 09/13/2017     ...   Component     Latest Ref Rng & Units 09/07/2016  Hep B Core Ab, Tot     Negative Negative  Hepatitis B Surface Ag     Negative Negative   Component     Latest Ref Rng & Units 08/10/2016  Sed Rate     0 - 40 mm/hr 12  LDH     125 - 245 U/L 178  EBV VCA IgM     0.0 - 35.9 U/mL <36.0  CMV IgM Ser EIA-aCnc     0.0 - 29.9 AU/mL <30.0  HIV Screen 4th Generation wRfx     Non Reactive Non Reactive  Hep C Virus Ab     0.0 - 0.9 s/co ratio <0.1     RADIOGRAPHIC STUDIES: ABDOMEN ULTRASOUND COMPLETE  COMPARISON:  CT 08/03/2016  FINDINGS: Gallbladder: No gallstones or wall thickening visualized. No sonographic Murphy sign noted by sonographer.  Common bile duct: Diameter: 1.8 mm.  Liver: No focal lesion identified. Within normal limits in parenchymal echogenicity. Portal vein is patent on color Doppler imaging with normal direction of blood flow towards the liver.  IVC: No abnormality visualized.  Pancreas: Visualized portion unremarkable.  Spleen: Mild to moderate splenomegaly as the spleen measures 12.9 cm in greatest diameter, although splenic volume is increased measuring 785 cubic cm.  Right Kidney: Length: 10.1 cm. Echogenicity within normal limits. No mass or hydronephrosis visualized.  Left Kidney: Length: 10.8 cm. Echogenicity within normal limits. No mass or hydronephrosis visualized.  Abdominal aorta: No aneurysm visualized.  Other findings: None.  IMPRESSION: No acute findings.  Known mild to moderate splenomegaly with splenic volume 785 cubic cm.   Electronically Signed   By:  Marin Olp M.D.   On: 11/30/2016 10:09   ASSESSMENT & PLAN:   62 y.o. 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 Diffusely hypermetabolic on PET/CT No evidence of liver disease on CT scan. No evidence of portal hypertension. MPN w/u demonstrated no evidence of genetics mutations to suggest MPN  [place] abd 11/30/2016 - spleen now down to 12.9 cm post Rituxan treatment  #2 Mild thrombocytopenia platelets of 107k. 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 Platelets had normalized completely to 152K.  plt today at 130k - slightly lower in the setting of recent URI  #3 Indetermine left renal lesion 1.5 cms in size ?complex cyst - will need monitoring. No reports of this on [place] ab in 11/2016.  #5 Severe anxiety- improved since initial treatment.   PLAN -Discussed pt labwork today, 09/13/17; blood counts and chemistries are stable. LDH is WNL at 149.  -The pt shows no clinical or lab progression/return of SMZL at this time.  -No indication for further treatment at this time.   -Continue follow up with PCP -Will see the pt back in 4 months, sooner if any new concerns    RTC with Dr Irene Limbo in 4 months with labs    Orders Placed This Encounter  Procedures  . CBC with Differential/Platelet    Standing Status:   Future    Standing Expiration Date:   10/18/2018  . CMP ([place] only)    Standing Status:   Future    Standing Expiration Date:   09/14/2018  . Lactate dehydrogenase    Standing Status:   Future    Standing Expiration Date:   09/14/2018   All of the patients questions were answered with apparent satisfaction. The patient knows to call the clinic with any problems, questions or concerns.  The total time spent in the appt was 20 minutes and more than 50% was on counseling and direct patient cares.    Sullivan Lone MD MS AAHIVMS [facility] Hematology/Oncology Physician [facility]  (Office):        [phone] (Work cell):  [phone] (Fax):           [phone]  I, Baldwin Jamaica, am acting as a scribe for Dr. Irene Limbo  .I have reviewed the above documentation for accuracy and completeness, and I agree with the above. Brunetta Genera MD

## 2017-09-13 ENCOUNTER — Inpatient Hospital Stay: Payer: BLUE CROSS/BLUE SHIELD

## 2017-09-13 ENCOUNTER — Telehealth: Payer: Self-pay

## 2017-09-13 ENCOUNTER — Inpatient Hospital Stay: Payer: BLUE CROSS/BLUE SHIELD | Attending: Hematology | Admitting: Hematology

## 2017-09-13 VITALS — BP 151/70 | HR 55 | Temp 98.9°F | Resp 18 | Ht 61.0 in | Wt 209.2 lb

## 2017-09-13 DIAGNOSIS — D696 Thrombocytopenia, unspecified: Secondary | ICD-10-CM | POA: Diagnosis not present

## 2017-09-13 DIAGNOSIS — C8307 Small cell B-cell lymphoma, spleen: Secondary | ICD-10-CM | POA: Diagnosis not present

## 2017-09-13 DIAGNOSIS — Z87891 Personal history of nicotine dependence: Secondary | ICD-10-CM | POA: Insufficient documentation

## 2017-09-13 DIAGNOSIS — F419 Anxiety disorder, unspecified: Secondary | ICD-10-CM | POA: Diagnosis not present

## 2017-09-13 DIAGNOSIS — N289 Disorder of kidney and ureter, unspecified: Secondary | ICD-10-CM | POA: Diagnosis not present

## 2017-09-13 LAB — CBC WITH DIFFERENTIAL/PLATELET
Basophils Absolute: 0 10*3/uL (ref 0.0–0.1)
Basophils Relative: 1 %
EOS PCT: 3 %
Eosinophils Absolute: 0.1 10*3/uL (ref 0.0–0.5)
HCT: 42.1 % (ref 34.8–46.6)
HEMOGLOBIN: 14.1 g/dL (ref 11.6–15.9)
Lymphocytes Relative: 31 %
Lymphs Abs: 1.6 10*3/uL (ref 0.9–3.3)
MCH: 28.8 pg (ref 25.1–34.0)
MCHC: 33.5 g/dL (ref 31.5–36.0)
MCV: 86.1 fL (ref 79.5–101.0)
Monocytes Absolute: 0.4 10*3/uL (ref 0.1–0.9)
Monocytes Relative: 8 %
NEUTROS PCT: 57 %
Neutro Abs: 3.1 10*3/uL (ref 1.5–6.5)
Platelets: 110 10*3/uL — ABNORMAL LOW (ref 145–400)
RBC: 4.89 MIL/uL (ref 3.70–5.45)
RDW: 13.2 % (ref 11.2–14.5)
WBC: 5.2 10*3/uL (ref 3.9–10.3)

## 2017-09-13 LAB — CMP (CANCER CENTER ONLY)
ALBUMIN: 4 g/dL (ref 3.5–5.0)
ALK PHOS: 104 U/L (ref 38–126)
ALT: 38 U/L (ref 0–44)
AST: 25 U/L (ref 15–41)
Anion gap: 7 (ref 5–15)
BUN: 20 mg/dL (ref 6–20)
CALCIUM: 9.1 mg/dL (ref 8.9–10.3)
CO2: 27 mmol/L (ref 22–32)
CREATININE: 0.74 mg/dL (ref 0.44–1.00)
Chloride: 107 mmol/L (ref 98–111)
GFR, Est AFR Am: >60 mL/min (ref >60)
GFR, Estimated: >60 mL/min (ref >60)
GLUCOSE: 93 mg/dL (ref 70–99)
Potassium: 4.4 mmol/L (ref 3.5–5.1)
SODIUM: 141 mmol/L (ref 135–145)
Total Bilirubin: 0.9 mg/dL (ref 0.3–1.2)
Total Protein: 6.2 g/dL — ABNORMAL LOW (ref 6.5–8.1)

## 2017-09-13 LAB — LACTATE DEHYDROGENASE: LDH: 149 U/L (ref 98–192)

## 2017-09-13 LAB — RETICULOCYTES
RBC.: 4.89 MIL/uL (ref 3.70–5.45)
Retic Count, Absolute: 78.2 10*3/uL (ref 33.7–90.7)
Retic Ct Pct: 1.6 % (ref 0.7–2.1)

## 2017-09-13 NOTE — Telephone Encounter (Signed)
Printed avs and calender of upcoming per 8/23 los

## 2017-09-29 DIAGNOSIS — R0789 Other chest pain: Secondary | ICD-10-CM | POA: Diagnosis not present

## 2017-10-14 DIAGNOSIS — S46119A Strain of muscle, fascia and tendon of long head of biceps, unspecified arm, initial encounter: Secondary | ICD-10-CM | POA: Diagnosis not present

## 2017-11-14 DIAGNOSIS — Z01419 Encounter for gynecological examination (general) (routine) without abnormal findings: Secondary | ICD-10-CM | POA: Diagnosis not present

## 2017-11-14 DIAGNOSIS — Z130 Encounter for screening for diseases of the blood and blood-forming organs and certain disorders involving the immune mechanism: Secondary | ICD-10-CM | POA: Diagnosis not present

## 2017-11-14 DIAGNOSIS — Z1231 Encounter for screening mammogram for malignant neoplasm of breast: Secondary | ICD-10-CM | POA: Diagnosis not present

## 2017-11-14 DIAGNOSIS — Z780 Asymptomatic menopausal state: Secondary | ICD-10-CM | POA: Diagnosis not present

## 2017-11-14 DIAGNOSIS — B372 Candidiasis of skin and nail: Secondary | ICD-10-CM | POA: Diagnosis not present

## 2017-11-14 DIAGNOSIS — N393 Stress incontinence (female) (male): Secondary | ICD-10-CM | POA: Diagnosis not present

## 2017-11-22 DIAGNOSIS — Z0000 Encounter for general adult medical examination without abnormal findings: Secondary | ICD-10-CM | POA: Diagnosis not present

## 2017-11-22 DIAGNOSIS — E785 Hyperlipidemia, unspecified: Secondary | ICD-10-CM | POA: Diagnosis not present

## 2017-11-22 DIAGNOSIS — R002 Palpitations: Secondary | ICD-10-CM | POA: Diagnosis not present

## 2017-11-28 DIAGNOSIS — J209 Acute bronchitis, unspecified: Secondary | ICD-10-CM | POA: Diagnosis not present

## 2017-12-05 DIAGNOSIS — R002 Palpitations: Secondary | ICD-10-CM | POA: Diagnosis not present

## 2017-12-05 DIAGNOSIS — R55 Syncope and collapse: Secondary | ICD-10-CM | POA: Diagnosis not present

## 2017-12-05 DIAGNOSIS — R0789 Other chest pain: Secondary | ICD-10-CM | POA: Diagnosis not present

## 2017-12-05 DIAGNOSIS — R600 Localized edema: Secondary | ICD-10-CM | POA: Diagnosis not present

## 2017-12-26 DIAGNOSIS — R002 Palpitations: Secondary | ICD-10-CM | POA: Diagnosis not present

## 2017-12-26 DIAGNOSIS — R0789 Other chest pain: Secondary | ICD-10-CM | POA: Diagnosis not present

## 2017-12-26 DIAGNOSIS — I10 Essential (primary) hypertension: Secondary | ICD-10-CM | POA: Diagnosis not present

## 2017-12-26 DIAGNOSIS — R55 Syncope and collapse: Secondary | ICD-10-CM | POA: Diagnosis not present

## 2017-12-27 DIAGNOSIS — I10 Essential (primary) hypertension: Secondary | ICD-10-CM | POA: Diagnosis not present

## 2017-12-27 DIAGNOSIS — R0789 Other chest pain: Secondary | ICD-10-CM | POA: Diagnosis not present

## 2017-12-27 DIAGNOSIS — R002 Palpitations: Secondary | ICD-10-CM | POA: Diagnosis not present

## 2018-01-02 DIAGNOSIS — R600 Localized edema: Secondary | ICD-10-CM | POA: Diagnosis not present

## 2018-01-02 DIAGNOSIS — I10 Essential (primary) hypertension: Secondary | ICD-10-CM | POA: Diagnosis not present

## 2018-01-02 DIAGNOSIS — C8590 Non-Hodgkin lymphoma, unspecified, unspecified site: Secondary | ICD-10-CM | POA: Diagnosis not present

## 2018-01-02 DIAGNOSIS — R0789 Other chest pain: Secondary | ICD-10-CM | POA: Diagnosis not present

## 2018-01-09 NOTE — Progress Notes (Signed)
[name]    HEMATOLOGY/ONCOLOGY CLINIC NOTE  Date of Service: 01/10/18    Patient Care Team: Pa, [place] as PCP - General (Family Medicine)  PCP: [name] MD   CHIEF COMPLAINTS/PURPOSE OF CONSULTATION:  F/u for splenic marginal zone lymphoma  HISTORY OF PRESENTING ILLNESS:   Wendy Avery is a wonderful 62 y.o. female who has been referred to [place] by [facility]  for evaluation and management of possible lymphoma in the setting of massive splenomegaly and abdominal lymphadenopathy.  Patient patient has no significant chronic medical issues. She presented to urgent care with abdominal bloating and distention for 1-2 months. Also had some abdominal discomfort in the left upper quadrant of the abdomen and notes that she could feel a hard mass. She also notes some aches in her chest for 1-2 months and significant progressive fatigue which is starting to affect her quality of life. Over the last month she also notes some drenching night sweats but isn't sure if this is related to menopausal symptoms. Denies any overt weight loss. Patient has history of anxiety and notes severe anxiety over the symptoms.  Patient had a CT of the abdomen and pelvis with contrast on 08/03/2016 to further evaluate her symptoms. This showed massive splenomegaly with the spleen measuring 23.0 x 20.2 x 11.6 cm (volume = 2820 cm^3). Also noted to have concurrent  mild abdominal adenopathy. Incidental findings of Indeterminate left renal lesion. Most likely a minimally complex cyst. A solid neoplasm cannot be excluded.  Patient  notes that she wants to figure out the diagnosis ASAP and is very anxious. Reports no history of alcohol abuse or liver disease.  She has them out of colonoscopy but has had fecal occult blood testing recently which was negative. Last mammogram April 2017 with the normal limits. Last Pap smear August 2017 was within normal limits.  INTERVAL  HISTORY  Wendy Avery is here for her scheduled follow-up for her SMZL. The patient's last visit with [place] was on 09/13/17. The pt reports that she is doing well overall.  The pt reports that she has not developed any new concerns. She notes that she has been "eating too well," and has gained a few pounds in the interim. She denies developing any fevers, chills, night sweats, or noticing any new lumps or bumps. She also denies abdominal pains and leg swelling.   She stopped working out since our last visit after straining her shoulder while walking her dogs. She is planning to resume working out after the first of the year.   Lab results today (01/10/18) of CBC w/diff and CMP is as follows: all values are WNL except for PLT at 119k, Sodium at 102, Total Protein at 6.3. 01/10/18 LDH at 170  On review of systems, pt reports good energy levels, eating well, weight gain, and denies fevers, chills, night sweats, unexpected weight loss, noticing any new lumps or bumps, new skin rashes, abdominal pains, leg swelling, and any other symptoms.   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 Marital status: Widowed    Spouse name: Not on file  . Number of children: Not on file  . Years of education:  Not on file  . Highest education level: Not on file  Occupational History  . Not on file  Social Needs  . Financial resource strain: Not on file  . Food insecurity:    Worry: Not on file    Inability: Not on file  . Transportation needs:    Medical: Not on file    Non-medical: Not on file  Tobacco Use  . Smoking status: Former Smoker    Last attempt to quit: 2010    Years since quitting: 9.9  .  Smokeless tobacco: Never Used  Substance and Sexual Activity  . Alcohol use: Yes    Comment: occassional   . Drug use: No  . Sexual activity: Not on file  Lifestyle  . Physical activity:    Days per week: Not on file    Minutes per session: Not on file  . Stress: Not on file  Relationships  . Social connections:    Talks on phone: Not on file    Gets together: Not on file    Attends religious service: Not on file    Active member of club or organization: Not on file    Attends meetings of clubs or organizations: Not on file    Relationship status: Not on file  . Intimate partner violence:    Fear of current or ex partner: Not on file    Emotionally abused: Not on file    Physically abused: Not on file    Forced sexual activity: Not on file  Other Topics Concern  . Not on file  Social History Narrative  . Not on file  Ex-smoker 1 pack per day quit 30 years ago Uses alcohol socially She is widowed and has 2 daughters   FAMILY HISTORY: Mom had breast cancer at age 85 years and died from metastatic disease Father skin cancer, vocal cord cancer, alcohol abuse  ALLERGIES:  ?PCN  MEDICATIONS:  No current outpatient medications on file.   No current facility-administered medications for this visit.     REVIEW OF SYSTEMS:    A 10+ POINT REVIEW OF SYSTEMS WAS OBTAINED including neurology, dermatology, psychiatry, cardiac, respiratory, lymph, extremities, GI, GU, Musculoskeletal, constitutional, breasts, reproductive, HEENT.  All pertinent positives are noted in the HPI.  All others are negative.   PHYSICAL EXAMINATION: ECOG PERFORMANCE STATUS: 1 - Symptomatic but completely ambulatory  . Vitals:   01/10/18 1034  BP: 127/61  Pulse: 65  Resp: 18  Temp: 97.7 F (36.5 C)  SpO2: 100%   Filed Weights   01/10/18 1034  Weight: 211 lb 3.2 oz (95.8 kg)   .Body mass index is 39.91 kg/m.  GENERAL:alert, in no acute distress and comfortable SKIN: no acute rashes, no  significant lesions EYES: conjunctiva are pink and non-injected, sclera anicteric OROPHARYNX: MMM, no exudates, no oropharyngeal erythema or ulceration NECK: supple, no JVD LYMPH:  no palpable lymphadenopathy in the cervical, axillary or inguinal regions LUNGS: clear to auscultation b/l with normal respiratory effort HEART: regular rate & rhythm ABDOMEN:  normoactive bowel sounds , non tender, not distended. No palpable hepatosplenomegaly.  Extremity: no pedal edema PSYCH: alert & oriented x 3 with fluent speech NEURO: no focal motor/sensory deficits   LABORATORY DATA:  I have reviewed the data as listed  . CBC Latest Ref Rng & Units 01/10/2018 09/13/2017 06/13/2017  WBC 4.0 - 10.5 K/uL 4.7 5.2 5.6  Hemoglobin 12.0 - 15.0 g/dL 13.8 14.1 14.4  Hematocrit 36.0 - 46.0 % 42.2 42.1 43.0  Platelets 150 - 400 K/uL 119(L) 110(L) 130(L)   CBC    Component Value Date/Time   WBC 4.7 01/10/2018 0938   RBC 4.88 01/10/2018 0938   HGB 13.8 01/10/2018 0938   HGB 14.4 06/13/2017 0950   HGB 15.0 01/03/2017 1138   HCT 42.2 01/10/2018 0938   HCT 44.4 01/03/2017 1138   PLT 119 (L) 01/10/2018 0938   PLT 130 (L) 06/13/2017 0950   PLT 169 01/03/2017 1138   MCV 86.5 01/10/2018 0938   MCV 86.9 01/03/2017 1138   MCH 28.3 01/10/2018 0938   MCHC 32.7 01/10/2018 0938   RDW 13.4 01/10/2018 0938   RDW 12.9 01/03/2017 1138   LYMPHSABS 1.3 01/10/2018 0938   LYMPHSABS 1.8 01/03/2017 1138   MONOABS 0.3 01/10/2018 0938   MONOABS 0.4 01/03/2017 1138   EOSABS 0.1 01/10/2018 0938   EOSABS 0.2 01/03/2017 1138   BASOSABS 0.0 01/10/2018 0938   BASOSABS 0.0 01/03/2017 1138    . CMP Latest Ref Rng & Units 01/10/2018 09/13/2017 06/13/2017  Glucose 70 - 99 mg/dL 102(H) 93 92  BUN 6 - 20 mg/dL 16 20 16   Creatinine 0.44 - 1.00 mg/dL 0.78 0.74 0.72  Sodium 135 - 145 mmol/L 143 141 140  Potassium 3.5 - 5.1 mmol/L 3.9 4.4 4.1  Chloride 98 - 111 mmol/L 107 107 109  CO2 22 - 32 mmol/L 27 27 26   Calcium 8.9 - 10.3  mg/dL 9.2 9.1 9.1  Total Protein 6.5 - 8.1 g/dL 6.3(L) 6.2(L) 6.4  Total Bilirubin 0.3 - 1.2 mg/dL 0.7 0.9 0.7  Alkaline Phos 38 - 126 U/L 97 104 117  AST 15 - 41 U/L 19 25 23   ALT 0 - 44 U/L 34 38 44    Lab Results  Component Value Date   LDH 170 01/10/2018     ...   Component     Latest Ref Rng & Units 09/07/2016  Hep B Core Ab, Tot     Negative Negative  Hepatitis B Surface Ag     Negative Negative   Component     Latest Ref Rng & Units 08/10/2016  Sed Rate     0 - 40 mm/hr 12  LDH     125 - 245 U/L 178  EBV VCA IgM     0.0 - 35.9 U/mL <36.0  CMV IgM Ser EIA-aCnc     0.0 - 29.9 AU/mL <30.0  HIV Screen 4th Generation wRfx     Non Reactive Non Reactive  Hep C Virus Ab     0.0 - 0.9 s/co ratio <0.1     RADIOGRAPHIC STUDIES: ABDOMEN ULTRASOUND COMPLETE  COMPARISON:  CT 08/03/2016  FINDINGS: Gallbladder: No gallstones or wall thickening visualized. No sonographic Murphy sign noted by sonographer.  Common bile duct: Diameter: 1.8 mm.  Liver: No focal lesion identified. Within normal limits in parenchymal echogenicity. Portal vein is patent on color Doppler imaging with normal direction of blood flow towards the liver.  IVC: No abnormality visualized.  Pancreas: Visualized portion unremarkable.  Spleen: Mild to moderate splenomegaly as the spleen measures 12.9 cm in greatest diameter, although splenic volume is increased measuring 785 cubic cm.  Right Kidney: Length: 10.1 cm. Echogenicity within normal limits. No mass or hydronephrosis visualized.  Left Kidney: Length: 10.8 cm. Echogenicity within normal limits. No mass or hydronephrosis visualized.  Abdominal aorta: No aneurysm visualized.  Other findings: None.  IMPRESSION: No acute findings.  Known mild to moderate splenomegaly with splenic volume 785 cubic  cm.   Electronically Signed   By: Marin Olp M.D.   On: 11/30/2016 10:09   ASSESSMENT & PLAN:   62 y.o.  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 Diffusely hypermetabolic on PET/CT No evidence of liver disease on CT scan. No evidence of portal hypertension. MPN w/u demonstrated no evidence of genetics mutations to suggest MPN  [place] abd 11/30/2016 - spleen now down to 12.9 cm post Rituxan treatment  #2 Mild thrombocytopenia platelets of 107k. Likely due to the [place] and from hypersplenism in the setting of massive splenomegaly. Platelets had normalized completely to 152K.  plt today at 130k - slightly lower in the setting of recent URI  #3 Indetermine left renal lesion 1.5 cms in size ?complex cyst - will need monitoring. No reports of this on [place] ab in 11/2016.  #5 Severe anxiety- improved since initial treatment.   PLAN -Discussed pt labwork today, 01/10/18; PLT slightly improved to 119k since last visit, blood counts and chemistries are otherwise stable. LDH is WNL at 170.  -The pt shows no clinical or lab progression/return of SMZL at this time.  -No indication for further treatment at this time.  -Recommended that the pt continue to eat well, drink at least 48-64 oz of water each day, and walk 20-30 minutes each day.  -Continue follow up with PCP  -Will see the pt back in 4 months    RTC with Dr Irene Limbo with labs in 4 months   Orders Placed This Encounter  Procedures  . CBC with Differential/Platelet    Standing Status:   Future    Standing Expiration Date:   02/14/2019  . CMP ([place] only)    Standing Status:   Future    Standing Expiration Date:   01/11/2019  . Lactate dehydrogenase    Standing Status:   Future    Standing Expiration Date:   01/11/2019   All of the patients questions were answered with apparent satisfaction. The patient knows to call the clinic with any problems, questions or concerns.  The  total time spent in the appt was 20 minutes and more than 50% was on counseling and direct patient cares.   Sullivan Lone MD [place] AAHIVMS [facility] Hematology/Oncology Physician [facility]  (Office):       [phone] (Work cell):  [phone] (Fax):           [phone]  I, Baldwin [place], am acting as a scribe for Dr. Sullivan Lone.   .I have reviewed the above documentation for accuracy and completeness, and I agree with the above. Brunetta Genera MD

## 2018-01-10 ENCOUNTER — Inpatient Hospital Stay: Payer: BLUE CROSS/BLUE SHIELD

## 2018-01-10 ENCOUNTER — Telehealth: Payer: Self-pay

## 2018-01-10 ENCOUNTER — Inpatient Hospital Stay: Payer: BLUE CROSS/BLUE SHIELD | Attending: Hematology | Admitting: Hematology

## 2018-01-10 VITALS — BP 127/61 | HR 65 | Temp 97.7°F | Resp 18 | Ht 61.0 in | Wt 211.2 lb

## 2018-01-10 DIAGNOSIS — D696 Thrombocytopenia, unspecified: Secondary | ICD-10-CM

## 2018-01-10 DIAGNOSIS — Z87891 Personal history of nicotine dependence: Secondary | ICD-10-CM | POA: Insufficient documentation

## 2018-01-10 DIAGNOSIS — F419 Anxiety disorder, unspecified: Secondary | ICD-10-CM

## 2018-01-10 DIAGNOSIS — C8307 Small cell B-cell lymphoma, spleen: Secondary | ICD-10-CM

## 2018-01-10 LAB — CBC WITH DIFFERENTIAL/PLATELET
Abs Immature Granulocytes: 0.01 10*3/uL (ref 0.00–0.07)
BASOS ABS: 0 10*3/uL (ref 0.0–0.1)
BASOS PCT: 1 %
EOS ABS: 0.1 10*3/uL (ref 0.0–0.5)
EOS PCT: 2 %
HCT: 42.2 % (ref 36.0–46.0)
Hemoglobin: 13.8 g/dL (ref 12.0–15.0)
Immature Granulocytes: 0 %
LYMPHS PCT: 29 %
Lymphs Abs: 1.3 10*3/uL (ref 0.7–4.0)
MCH: 28.3 pg (ref 26.0–34.0)
MCHC: 32.7 g/dL (ref 30.0–36.0)
MCV: 86.5 fL (ref 80.0–100.0)
Monocytes Absolute: 0.3 10*3/uL (ref 0.1–1.0)
Monocytes Relative: 7 %
NRBC: 0 % (ref 0.0–0.2)
Neutro Abs: 2.8 10*3/uL (ref 1.7–7.7)
Neutrophils Relative %: 61 %
PLATELETS: 119 10*3/uL — AB (ref 150–400)
RBC: 4.88 MIL/uL (ref 3.87–5.11)
RDW: 13.4 % (ref 11.5–15.5)
WBC: 4.7 10*3/uL (ref 4.0–10.5)

## 2018-01-10 LAB — CMP (CANCER CENTER ONLY)
ALT: 34 U/L (ref 0–44)
AST: 19 U/L (ref 15–41)
Albumin: 3.9 g/dL (ref 3.5–5.0)
Alkaline Phosphatase: 97 U/L (ref 38–126)
Anion gap: 9 (ref 5–15)
BUN: 16 mg/dL (ref 6–20)
CO2: 27 mmol/L (ref 22–32)
CREATININE: 0.78 mg/dL (ref 0.44–1.00)
Calcium: 9.2 mg/dL (ref 8.9–10.3)
Chloride: 107 mmol/L (ref 98–111)
Glucose, Bld: 102 mg/dL — ABNORMAL HIGH (ref 70–99)
Potassium: 3.9 mmol/L (ref 3.5–5.1)
SODIUM: 143 mmol/L (ref 135–145)
TOTAL PROTEIN: 6.3 g/dL — AB (ref 6.5–8.1)
Total Bilirubin: 0.7 mg/dL (ref 0.3–1.2)

## 2018-01-10 LAB — LACTATE DEHYDROGENASE: LDH: 170 U/L (ref 98–192)

## 2018-01-10 NOTE — Telephone Encounter (Signed)
Printed avs and calender of upcoming appointment. Per 12/20 los 

## 2018-01-30 DIAGNOSIS — L570 Actinic keratosis: Secondary | ICD-10-CM | POA: Diagnosis not present

## 2018-01-30 DIAGNOSIS — D225 Melanocytic nevi of trunk: Secondary | ICD-10-CM | POA: Diagnosis not present

## 2018-05-08 ENCOUNTER — Other Ambulatory Visit: Payer: Self-pay

## 2018-05-08 ENCOUNTER — Inpatient Hospital Stay: Payer: BLUE CROSS/BLUE SHIELD

## 2018-05-08 ENCOUNTER — Inpatient Hospital Stay: Payer: BLUE CROSS/BLUE SHIELD | Attending: Hematology | Admitting: Hematology

## 2018-05-08 ENCOUNTER — Telehealth: Payer: Self-pay | Admitting: Hematology

## 2018-05-08 VITALS — BP 136/60 | HR 60 | Temp 97.8°F | Resp 17 | Ht 61.0 in | Wt 213.3 lb

## 2018-05-08 DIAGNOSIS — N289 Disorder of kidney and ureter, unspecified: Secondary | ICD-10-CM | POA: Insufficient documentation

## 2018-05-08 DIAGNOSIS — F419 Anxiety disorder, unspecified: Secondary | ICD-10-CM

## 2018-05-08 DIAGNOSIS — Z808 Family history of malignant neoplasm of other organs or systems: Secondary | ICD-10-CM

## 2018-05-08 DIAGNOSIS — C8307 Small cell B-cell lymphoma, spleen: Secondary | ICD-10-CM | POA: Diagnosis not present

## 2018-05-08 DIAGNOSIS — Z87891 Personal history of nicotine dependence: Secondary | ICD-10-CM | POA: Diagnosis not present

## 2018-05-08 DIAGNOSIS — D696 Thrombocytopenia, unspecified: Secondary | ICD-10-CM

## 2018-05-08 LAB — CMP (CANCER CENTER ONLY)
ALT: 34 U/L (ref 0–44)
AST: 23 U/L (ref 15–41)
Albumin: 3.9 g/dL (ref 3.5–5.0)
Alkaline Phosphatase: 87 U/L (ref 38–126)
Anion gap: 9 (ref 5–15)
BUN: 19 mg/dL (ref 8–23)
CO2: 26 mmol/L (ref 22–32)
Calcium: 9.1 mg/dL (ref 8.9–10.3)
Chloride: 106 mmol/L (ref 98–111)
Creatinine: 0.74 mg/dL (ref 0.44–1.00)
GFR, Est AFR Am: >60 mL/min (ref >60)
GFR, Estimated: >60 mL/min (ref >60)
Glucose, Bld: 89 mg/dL (ref 70–99)
Potassium: 4.3 mmol/L (ref 3.5–5.1)
Sodium: 141 mmol/L (ref 135–145)
Total Bilirubin: 0.4 mg/dL (ref 0.3–1.2)
Total Protein: 6.5 g/dL (ref 6.5–8.1)

## 2018-05-08 LAB — CBC WITH DIFFERENTIAL/PLATELET
Abs Immature Granulocytes: 0.02 10*3/uL (ref 0.00–0.07)
Basophils Absolute: 0 10*3/uL (ref 0.0–0.1)
Basophils Relative: 0 %
Eosinophils Absolute: 0.2 10*3/uL (ref 0.0–0.5)
Eosinophils Relative: 3 %
HCT: 43.1 % (ref 36.0–46.0)
Hemoglobin: 13.7 g/dL (ref 12.0–15.0)
Immature Granulocytes: 0 %
Lymphocytes Relative: 29 %
Lymphs Abs: 1.7 10*3/uL (ref 0.7–4.0)
MCH: 27.8 pg (ref 26.0–34.0)
MCHC: 31.8 g/dL (ref 30.0–36.0)
MCV: 87.6 fL (ref 80.0–100.0)
Monocytes Absolute: 0.5 10*3/uL (ref 0.1–1.0)
Monocytes Relative: 8 %
Neutro Abs: 3.5 10*3/uL (ref 1.7–7.7)
Neutrophils Relative %: 60 %
Platelets: 120 10*3/uL — ABNORMAL LOW (ref 150–400)
RBC: 4.92 MIL/uL (ref 3.87–5.11)
RDW: 13.3 % (ref 11.5–15.5)
WBC: 6 10*3/uL (ref 4.0–10.5)
nRBC: 0 % (ref 0.0–0.2)

## 2018-05-08 LAB — LACTATE DEHYDROGENASE: LDH: 168 U/L (ref 98–192)

## 2018-05-08 NOTE — Progress Notes (Signed)
[name]    HEMATOLOGY/ONCOLOGY CLINIC NOTE  Date of Service: 05/08/18    Patient Care Team: Pa, Ponce as PCP - General (Family Medicine)  PCP: [name] MD   CHIEF COMPLAINTS/PURPOSE OF CONSULTATION:  F/u for splenic marginal zone lymphoma  HISTORY OF PRESENTING ILLNESS:   Wendy Avery is a wonderful 63 y.o. female who has been referred to [place] by Dr [facility]  for evaluation and management of possible lymphoma in the setting of massive splenomegaly and abdominal lymphadenopathy.  Patient patient has no significant chronic medical issues. She presented to urgent care with abdominal bloating and distention for 1-2 months. Also had some abdominal discomfort in the left upper quadrant of the abdomen and notes that she could feel a hard mass. She also notes some aches in her chest for 1-2 months and significant progressive fatigue which is starting to affect her quality of life. Over the last month she also notes some drenching night sweats but isn't sure if this is related to menopausal symptoms. Denies any overt weight loss. Patient has history of anxiety and notes severe anxiety over the symptoms.  Patient had a CT of the abdomen and pelvis with contrast on 08/03/2016 to further evaluate her symptoms. This showed massive splenomegaly with the spleen measuring 23.0 x 20.2 x 11.6 cm (volume = 2820 cm^3). Also noted to have concurrent  mild abdominal adenopathy. Incidental findings of Indeterminate left renal lesion. Most likely a minimally complex cyst. A solid neoplasm cannot be excluded.  Patient  notes that she wants to figure out the diagnosis ASAP and is very anxious. Reports no history of alcohol abuse or liver disease.  She has them out of colonoscopy but has had fecal occult blood testing recently which was negative. Last mammogram April 2017 with the normal limits. Last Pap smear August 2017 was within normal limits.  INTERVAL  HISTORY  Wendy Avery is here for her scheduled follow-up for her SMZL. The patient's last visit with [place] was on 01/10/18. The pt reports that she is doing well overall.   The pt reports that she has not developed any new concerns in the interim. She notes that she has not been able to keep up with her exercise recently and has gained some weight. She endorses good energy levels and denies fevers, chills, night sweats or unexpected weight loss.  Lab results today (05/08/18) of CBC w/diff and CMP is as follows: all values are WNL except for PLT at 120k. 05/08/18 LDH is at 168  On review of systems, pt reports good energy levels, moving her bowels well, and denies fevers, chills, night sweats, unexpected weight loss, fatigue, abdominal pains, new lumps or bumps, mouth sores, leg swelling, and any other symptoms.  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 Marital status: Widowed    Spouse name: Not on file  . Number of children: Not on file  . Years of education: Not on file  . Highest education level: Not on file  Occupational History  . Not on file  Social Needs  . Financial resource strain: Not on file  . Food insecurity:    Worry: Not on file  Inability: Not on file  . Transportation needs:    Medical: Not on file    Non-medical: Not on file  Tobacco Use  . Smoking status: Former Smoker    Last attempt to quit: 2010    Years since quitting: 10.2  . Smokeless tobacco: Never Used  Substance and Sexual Activity  . Alcohol use: Yes    Comment: occassional   . Drug use: No  . Sexual activity: Not on file  Lifestyle  . Physical activity:    Days per week: Not on file     Minutes per session: Not on file  . Stress: Not on file  Relationships  . Social connections:    Talks on phone: Not on file    Gets together: Not on file    Attends religious service: Not on file    Active member of club or organization: Not on file    Attends meetings of clubs or organizations: Not on file    Relationship status: Not on file  . Intimate partner violence:    Fear of current or ex partner: Not on file    Emotionally abused: Not on file    Physically abused: Not on file    Forced sexual activity: Not on file  Other Topics Concern  . Not on file  Social History Narrative  . Not on file  Ex-smoker 1 pack per day quit 30 years ago Uses alcohol socially She is widowed and has 2 daughters   FAMILY HISTORY: Mom had breast cancer at age 48 years and died from metastatic disease Father skin cancer, vocal cord cancer, alcohol abuse  ALLERGIES:  ?PCN  MEDICATIONS:  No current outpatient medications on file.   No current facility-administered medications for this visit.     REVIEW OF SYSTEMS:    A 10+ POINT REVIEW OF SYSTEMS WAS OBTAINED including neurology, dermatology, psychiatry, cardiac, respiratory, lymph, extremities, GI, GU, Musculoskeletal, constitutional, breasts, reproductive, HEENT.  All pertinent positives are noted in the HPI.  All others are negative.   PHYSICAL EXAMINATION: ECOG PERFORMANCE STATUS: 1 - Symptomatic but completely ambulatory  . Vitals:   05/08/18 1451  BP: 136/60  Pulse: 60  Resp: 17  Temp: 97.8 F (36.6 C)  SpO2: 100%   Filed Weights   05/08/18 1451  Weight: 213 lb 4.8 oz (96.8 kg)   .Body mass index is 40.3 kg/m.  GENERAL:alert, in no acute distress and comfortable SKIN: no acute rashes, no significant lesions EYES: conjunctiva are pink and non-injected, sclera anicteric OROPHARYNX: MMM, no exudates, no oropharyngeal erythema or ulceration NECK: supple, no JVD LYMPH:  no palpable lymphadenopathy in the cervical,  axillary or inguinal regions LUNGS: clear to auscultation b/l with normal respiratory effort HEART: regular rate & rhythm ABDOMEN:  normoactive bowel sounds , non tender, not distended. No palpable hepatosplenomegaly.  Extremity: no pedal edema PSYCH: alert & oriented x 3 with fluent speech NEURO: no focal motor/sensory deficits   LABORATORY DATA:  I have reviewed the data as listed  . CBC Latest Ref Rng & Units 05/08/2018 01/10/2018 09/13/2017  WBC 4.0 - 10.5 K/uL 6.0 4.7 5.2  Hemoglobin 12.0 - 15.0 g/dL 13.7 13.8 14.1  Hematocrit 36.0 - 46.0 % 43.1 42.2 42.1  Platelets 150 - 400 K/uL 120(L) 119(L) 110(L)   CBC    Component Value Date/Time   WBC 6.0 05/08/2018 1430   RBC 4.92 05/08/2018 1430   HGB 13.7 05/08/2018 1430   HGB 14.4 06/13/2017 0950  HGB 15.0 01/03/2017 1138   HCT 43.1 05/08/2018 1430   HCT 44.4 01/03/2017 1138   PLT 120 (L) 05/08/2018 1430   PLT 130 (L) 06/13/2017 0950   PLT 169 01/03/2017 1138   MCV 87.6 05/08/2018 1430   MCV 86.9 01/03/2017 1138   MCH 27.8 05/08/2018 1430   MCHC 31.8 05/08/2018 1430   RDW 13.3 05/08/2018 1430   RDW 12.9 01/03/2017 1138   LYMPHSABS 1.7 05/08/2018 1430   LYMPHSABS 1.8 01/03/2017 1138   MONOABS 0.5 05/08/2018 1430   MONOABS 0.4 01/03/2017 1138   EOSABS 0.2 05/08/2018 1430   EOSABS 0.2 01/03/2017 1138   BASOSABS 0.0 05/08/2018 1430   BASOSABS 0.0 01/03/2017 1138    . CMP Latest Ref Rng & Units 05/08/2018 01/10/2018 09/13/2017  Glucose 70 - 99 mg/dL 89 102(H) 93  BUN 8 - 23 mg/dL 19 16 20   Creatinine 0.44 - 1.00 mg/dL 0.74 0.78 0.74  Sodium 135 - 145 mmol/L 141 143 141  Potassium 3.5 - 5.1 mmol/L 4.3 3.9 4.4  Chloride 98 - 111 mmol/L 106 107 107  CO2 22 - 32 mmol/L 26 27 27   Calcium 8.9 - 10.3 mg/dL 9.1 9.2 9.1  Total Protein 6.5 - 8.1 g/dL 6.5 6.3(L) 6.2(L)  Total Bilirubin 0.3 - 1.2 mg/dL 0.4 0.7 0.9  Alkaline Phos 38 - 126 U/L 87 97 104  AST 15 - 41 U/L 23 19 25   ALT 0 - 44 U/L 34 34 38    Lab Results   Component Value Date   LDH 168 05/08/2018     ...   Component     Latest Ref Rng & Units 09/07/2016  Hep B Core Ab, Tot     Negative Negative  Hepatitis B Surface Ag     Negative Negative   Component     Latest Ref Rng & Units 08/10/2016  Sed Rate     0 - 40 mm/hr 12  LDH     125 - 245 U/L 178  EBV VCA IgM     0.0 - 35.9 U/mL <36.0  CMV IgM Ser EIA-aCnc     0.0 - 29.9 AU/mL <30.0  HIV Screen 4th Generation wRfx     Non Reactive Non Reactive  Hep C Virus Ab     0.0 - 0.9 s/co ratio <0.1     RADIOGRAPHIC STUDIES: ABDOMEN ULTRASOUND COMPLETE  COMPARISON:  CT 08/03/2016  FINDINGS: Gallbladder: No gallstones or wall thickening visualized. No sonographic Murphy sign noted by sonographer.  Common bile duct: Diameter: 1.8 mm.  Liver: No focal lesion identified. Within normal limits in parenchymal echogenicity. Portal vein is patent on color Doppler imaging with normal direction of blood flow towards the liver.  IVC: No abnormality visualized.  Pancreas: Visualized portion unremarkable.  Spleen: Mild to moderate splenomegaly as the spleen measures 12.9 cm in greatest diameter, although splenic volume is increased measuring 785 cubic cm.  Right Kidney: Length: 10.1 cm. Echogenicity within normal limits. No mass or hydronephrosis visualized.  Left Kidney: Length: 10.8 cm. Echogenicity within normal limits. No mass or hydronephrosis visualized.  Abdominal aorta: No aneurysm visualized.  Other findings: None.  IMPRESSION: No acute findings.  Known mild to moderate splenomegaly with splenic volume 785 cubic cm.   Electronically Signed   By: Marin Olp M.D.   On: 11/30/2016 10:09   ASSESSMENT & PLAN:   63 y.o. 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 Diffusely  hypermetabolic on PET/CT No evidence of liver disease on CT scan. No evidence of portal hypertension. MPN w/u demonstrated no evidence of genetics mutations to suggest MPN  [place] abd 11/30/2016 - spleen now down to 12.9 cm post Rituxan treatment  #2 Mild thrombocytopenia platelets of 107k. 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 Platelets had normalized completely to 152K.  plt today at 130k - slightly lower in the setting of recent URI  #3 Indetermine left renal lesion 1.5 cms in size ?complex cyst - will need monitoring. No reports of this on [place] ab in 11/2016.  #5 Severe anxiety- improved since initial treatment.   PLAN -Discussed pt labwork today, 05/08/18; PLT stable at 120k, other blood counts and chemistries are normal. LDH normal at 168. -The pt shows no clinical or lab progression/return of her SMZL at this time.  -No indication for further treatment at this time. -Recommended that the pt continue to eat well, drink at least 48-64 oz of water each day, and walk 20-30 minutes each day.  -Continue follow up with PCP -Will see the pt back in 4 months   RTC with Dr Irene Limbo with labs in 4 months   Orders Placed This Encounter  Procedures  . CBC with Differential/Platelet    Standing Status:   Future    Standing Expiration Date:   06/12/2019  . CMP ([place] only)    Standing Status:   Future    Standing Expiration Date:   05/08/2019  . Lactate dehydrogenase    Standing Status:   Future    Standing Expiration Date:   05/08/2019   All of the patients questions were answered with apparent satisfaction. The patient knows to call the clinic with any problems, questions or concerns.  The total time spent in the appt was 20 minutes and more than 50% was on counseling and direct patient cares.   Sullivan Lone MD [place] AAHIVMS [facility] Hematology/Oncology Physician [facility]  (Office):       [phone] (Work cell):   [phone] (Fax):           [phone]  I, Baldwin [place], am acting as a scribe for Dr. Sullivan Lone.   .I have reviewed the above documentation for accuracy and completeness, and I agree with the above. Brunetta Genera MD

## 2018-05-08 NOTE — Telephone Encounter (Signed)
Scheduled appt per 4/16 los. °

## 2018-05-18 ENCOUNTER — Encounter: Payer: Self-pay | Admitting: Hematology

## 2018-05-20 ENCOUNTER — Telehealth: Payer: Self-pay | Admitting: Hematology

## 2018-05-20 NOTE — Telephone Encounter (Signed)
Moved 8/17 to 8/18 per patients mychart request stated in sch msg.

## 2018-07-10 DIAGNOSIS — F439 Reaction to severe stress, unspecified: Secondary | ICD-10-CM | POA: Diagnosis not present

## 2018-07-10 DIAGNOSIS — K146 Glossodynia: Secondary | ICD-10-CM | POA: Diagnosis not present

## 2018-08-17 DIAGNOSIS — Z20828 Contact with and (suspected) exposure to other viral communicable diseases: Secondary | ICD-10-CM | POA: Diagnosis not present

## 2018-09-08 ENCOUNTER — Ambulatory Visit: Payer: BLUE CROSS/BLUE SHIELD | Admitting: Hematology

## 2018-09-08 ENCOUNTER — Other Ambulatory Visit: Payer: BLUE CROSS/BLUE SHIELD

## 2018-09-08 NOTE — Progress Notes (Signed)
[name]    HEMATOLOGY/ONCOLOGY CLINIC NOTE  Date of Service: 09/09/18    Patient Care Team: Pa, Huguley as PCP - General (Family Medicine)  PCP: [name] MD   CHIEF COMPLAINTS/PURPOSE OF CONSULTATION:  F/u for splenic marginal zone lymphoma  HISTORY OF PRESENTING ILLNESS:   Wendy Avery is a wonderful 63 y.o. female who has been referred to [place] by Dr [facility]  for evaluation and management of possible lymphoma in the setting of massive splenomegaly and abdominal lymphadenopathy.  Patient patient has no significant chronic medical issues. She presented to urgent care with abdominal bloating and distention for 1-2 months. Also had some abdominal discomfort in the left upper quadrant of the abdomen and notes that she could feel a hard mass. She also notes some aches in her chest for 1-2 months and significant progressive fatigue which is starting to affect her quality of life. Over the last month she also notes some drenching night sweats but isn't sure if this is related to menopausal symptoms. Denies any overt weight loss. Patient has history of anxiety and notes severe anxiety over the symptoms.  Patient had a CT of the abdomen and pelvis with contrast on 08/03/2016 to further evaluate her symptoms. This showed massive splenomegaly with the spleen measuring 23.0 x 20.2 x 11.6 cm (volume = 2820 cm^3). Also noted to have concurrent  mild abdominal adenopathy. Incidental findings of Indeterminate left renal lesion. Most likely a minimally complex cyst. A solid neoplasm cannot be excluded.  Patient  notes that she wants to figure out the diagnosis ASAP and is very anxious. Reports no history of alcohol abuse or liver disease.  She has them out of colonoscopy but has had fecal occult blood testing recently which was negative. Last mammogram April 2017 with the normal limits. Last Pap smear August 2017 was within normal limits.    INTERVAL HISTORY  Wendy Avery is here for her scheduled follow-up for her SMZL. The patient's last visit with [place] was on 05/08/2018.The pt reports that she is doing well overall.  The pt reports recent dental issues and recent anxiety with her children leaving home. The pandemic and her husband's passing has been also causing her distress. She states that she fell into depression for a period. She has also reported increased alcohol consumption, about 4 glasses per week on the weekends. She has been trying to move more by walking from time to time.   Lab results today (09/09/18) of CBC w/diff and CMP is as follows: all values are WNL except for Platelets 120K, Total Protein 6.3.   Lactate dehydrogenase is WNL.    On review of systems, pt reports bruising and hot flashes and denies night sweats, fatigue, chills, pain in the abdomen, any abnormal bowel movements and any other symptoms.     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 Marital status: Widowed    Spouse name: Not on file  . Number of children: Not on file  . Years of education: Not on file  . Highest education level: Not on file  Occupational History  . Not on file  Social Needs  . Financial resource strain: Not on  file  . Food insecurity    Worry: Not on file    Inability: Not on file  . Transportation needs    Medical: Not on file    Non-medical: Not on file  Tobacco Use  . Smoking status: Former Smoker    Quit date: 2010    Years since quitting: 10.6  . Smokeless tobacco: Never Used  Substance and Sexual Activity  . Alcohol use: Yes    Comment: occassional   . Drug use: No  . Sexual activity: Not on  file  Lifestyle  . Physical activity    Days per week: Not on file    Minutes per session: Not on file  . Stress: Not on file  Relationships  . Social Herbalist on phone: Not on file    Gets together: Not on file    Attends religious service: Not on file    Active member of club or organization: Not on file    Attends meetings of clubs or organizations: Not on file    Relationship status: Not on file  . Intimate partner violence    Fear of current or ex partner: Not on file    Emotionally abused: Not on file    Physically abused: Not on file    Forced sexual activity: Not on file  Other Topics Concern  . Not on file  Social History Narrative  . Not on file  Ex-smoker 1 pack per day quit 30 years ago Uses alcohol socially She is widowed and has 2 daughters   FAMILY HISTORY: Mom had breast cancer at age 18 years and died from metastatic disease Father skin cancer, vocal cord cancer, alcohol abuse  ALLERGIES:  ?PCN  MEDICATIONS:  No current outpatient medications on file.   No current facility-administered medications for this visit.     REVIEW OF SYSTEMS:   A 10+ POINT REVIEW OF SYSTEMS WAS OBTAINED including neurology, dermatology, psychiatry, cardiac, respiratory, lymph, extremities, GI, GU, Musculoskeletal, constitutional, breasts, reproductive, HEENT.  All pertinent positives are noted in the HPI.  All others are negative.    PHYSICAL EXAMINATION: ECOG PERFORMANCE STATUS: 1 - Symptomatic but completely ambulatory  . Vitals:   09/09/18 1402  BP: 140/60  Pulse: 62  Resp: 18  Temp: 98.2 F (36.8 C)  SpO2: 98%   Filed Weights   09/09/18 1402  Weight: 214 lb 11.2 oz (97.4 kg)   .Body mass index is 40.57 kg/m.   GENERAL:alert, in no acute distress and comfortable SKIN: no acute rashes, no significant lesions EYES: conjunctiva are pink and non-injected, sclera anicteric OROPHARYNX: MMM, no exudates, no oropharyngeal erythema or ulceration  NECK: supple, no JVD LYMPH:  no palpable lymphadenopathy in the cervical, axillary or inguinal regions LUNGS: clear to auscultation b/l with normal respiratory effort HEART: regular rate & rhythm ABDOMEN:  normoactive bowel sounds , non tender, not distended. Extremity: no pedal edema PSYCH: alert & oriented x 3 with fluent speech NEURO: no focal motor/sensory deficits   LABORATORY DATA:  I have reviewed the data as listed  . CBC Latest Ref Rng & Units 09/09/2018 05/08/2018 01/10/2018  WBC 4.0 - 10.5 K/uL 6.0 6.0 4.7  Hemoglobin 12.0 - 15.0 g/dL 13.5 13.7 13.8  Hematocrit 36.0 - 46.0 % 40.9 43.1 42.2  Platelets 150 - 400 K/uL 120(L) 120(L) 119(L)   CBC    Component Value Date/Time   WBC 6.0 09/09/2018 1253   RBC 4.73 09/09/2018 1253   HGB 13.5  09/09/2018 1253   HGB 14.4 06/13/2017 0950   HGB 15.0 01/03/2017 1138   HCT 40.9 09/09/2018 1253   HCT 44.4 01/03/2017 1138   PLT 120 (L) 09/09/2018 1253   PLT 130 (L) 06/13/2017 0950   PLT 169 01/03/2017 1138   MCV 86.5 09/09/2018 1253   MCV 86.9 01/03/2017 1138   MCH 28.5 09/09/2018 1253   MCHC 33.0 09/09/2018 1253   RDW 13.4 09/09/2018 1253   RDW 12.9 01/03/2017 1138   LYMPHSABS 2.1 09/09/2018 1253   LYMPHSABS 1.8 01/03/2017 1138   MONOABS 0.5 09/09/2018 1253   MONOABS 0.4 01/03/2017 1138   EOSABS 0.1 09/09/2018 1253   EOSABS 0.2 01/03/2017 1138   BASOSABS 0.0 09/09/2018 1253   BASOSABS 0.0 01/03/2017 1138    . CMP Latest Ref Rng & Units 09/09/2018 05/08/2018 01/10/2018  Glucose 70 - 99 mg/dL 78 89 102(H)  BUN 8 - 23 mg/dL 15 19 16   Creatinine 0.44 - 1.00 mg/dL 0.73 0.74 0.78  Sodium 135 - 145 mmol/L 141 141 143  Potassium 3.5 - 5.1 mmol/L 4.3 4.3 3.9  Chloride 98 - 111 mmol/L 106 106 107  CO2 22 - 32 mmol/L 25 26 27   Calcium 8.9 - 10.3 mg/dL 8.9 9.1 9.2  Total Protein 6.5 - 8.1 g/dL 6.3(L) 6.5 6.3(L)  Total Bilirubin 0.3 - 1.2 mg/dL 0.6 0.4 0.7  Alkaline Phos 38 - 126 U/L 89 87 97  AST 15 - 41 U/L 20 23 19   ALT 0  - 44 U/L 29 34 34    Lab Results  Component Value Date   LDH 174 09/09/2018     ...   Component     Latest Ref Rng & Units 09/07/2016  Hep B Core Ab, Tot     Negative Negative  Hepatitis B Surface Ag     Negative Negative   Component     Latest Ref Rng & Units 08/10/2016  Sed Rate     0 - 40 mm/hr 12  LDH     125 - 245 U/L 178  EBV VCA IgM     0.0 - 35.9 U/mL <36.0  CMV IgM Ser EIA-aCnc     0.0 - 29.9 AU/mL <30.0  HIV Screen 4th Generation wRfx     Non Reactive Non Reactive  Hep C Virus Ab     0.0 - 0.9 s/co ratio <0.1     RADIOGRAPHIC STUDIES: ABDOMEN ULTRASOUND COMPLETE  COMPARISON:  CT 08/03/2016  FINDINGS: Gallbladder: No gallstones or wall thickening visualized. No sonographic Murphy sign noted by sonographer.  Common bile duct: Diameter: 1.8 mm.  Liver: No focal lesion identified. Within normal limits in parenchymal echogenicity. Portal vein is patent on color Doppler imaging with normal direction of blood flow towards the liver.  IVC: No abnormality visualized.  Pancreas: Visualized portion unremarkable.  Spleen: Mild to moderate splenomegaly as the spleen measures 12.9 cm in greatest diameter, although splenic volume is increased measuring 785 cubic cm.  Right Kidney: Length: 10.1 cm. Echogenicity within normal limits. No mass or hydronephrosis visualized.  Left Kidney: Length: 10.8 cm. Echogenicity within normal limits. No mass or hydronephrosis visualized.  Abdominal aorta: No aneurysm visualized.  Other findings: None.  IMPRESSION: No acute findings.  Known mild to moderate splenomegaly with splenic volume 785 cubic cm.   Electronically Signed   By: Marin Olp M.D.   On: 11/30/2016 10:09   ASSESSMENT & PLAN:   63 y.o. Caucasian female with  #1 Stage IV  Splenic Marginal Zone lymphoma currently in remission  Initially presented with Massive splenomegaly with the spleen measuring 23.0 x 20.2 x 11.6 cm  (volume = 2820 cm^3). Diffusely hypermetabolic on PET/CT No evidence of liver disease on CT scan. No evidence of portal hypertension. MPN w/u demonstrated no evidence of genetics mutations to suggest MPN  [place] abd 11/30/2016 - spleen now down to 12.9 cm post Rituxan treatment  #2 Mild thrombocytopenia platelets of 107k. Likely due to the [place] and from hypersplenism in the setting of massive splenomegaly. Platelets had normalized completely to 152K.  plt today at 130k - slightly lower in the setting of recent URI  #3 Indetermine left renal lesion 1.5 cms in size ?complex cyst - will need monitoring. No reports of this on [place] ab in 11/2016.  #5 Severe anxiety- improved since initial treatment.    PLAN A&P: -Discussed pt labwork today, 09/09/18;  all values are WNL except for Platelets 120K, Total Protein 6.3. Lactate dehydrogenase is WNL.  -no evidence of progression of SMZL at this time. - Discussed recent social stressors.  - Recommended that the pt continue to eat well, drink at least 48-64 oz of water each day, and walk 20-30 minutes each day.    FOLLOW UP: RTC with Dr Irene Limbo with labs in 4 months  All of the patients questions were answered with apparent satisfaction. The patient knows to call the clinic with any problems, questions or concerns.  The total time spent in the appt was 15 minutes and more than 50% was on counseling and direct patient cares.    Sullivan Lone MD [place] AAHIVMS [facility] Hematology/Oncology Physician [facility]  (Office):       [phone] (Work cell):  [phone] (Fax):           [phone]  I, Jacqualyn Posey, am acting as a scribe for Dr. Sullivan Lone.   .I have reviewed the above documentation for accuracy and completeness, and I agree with the above. Brunetta Genera MD

## 2018-09-09 ENCOUNTER — Other Ambulatory Visit: Payer: Self-pay

## 2018-09-09 ENCOUNTER — Inpatient Hospital Stay (HOSPITAL_BASED_OUTPATIENT_CLINIC_OR_DEPARTMENT_OTHER): Payer: BC Managed Care – PPO | Admitting: Hematology

## 2018-09-09 ENCOUNTER — Inpatient Hospital Stay: Payer: BC Managed Care – PPO | Attending: Hematology

## 2018-09-09 ENCOUNTER — Telehealth: Payer: Self-pay | Admitting: Hematology

## 2018-09-09 VITALS — BP 140/60 | HR 62 | Temp 98.2°F | Resp 18 | Ht 61.0 in | Wt 214.7 lb

## 2018-09-09 DIAGNOSIS — F419 Anxiety disorder, unspecified: Secondary | ICD-10-CM | POA: Insufficient documentation

## 2018-09-09 DIAGNOSIS — D696 Thrombocytopenia, unspecified: Secondary | ICD-10-CM | POA: Diagnosis not present

## 2018-09-09 DIAGNOSIS — C8307 Small cell B-cell lymphoma, spleen: Secondary | ICD-10-CM

## 2018-09-09 DIAGNOSIS — Z87891 Personal history of nicotine dependence: Secondary | ICD-10-CM | POA: Diagnosis not present

## 2018-09-09 LAB — CMP (CANCER CENTER ONLY)
ALT: 29 U/L (ref 0–44)
AST: 20 U/L (ref 15–41)
Albumin: 4 g/dL (ref 3.5–5.0)
Alkaline Phosphatase: 89 U/L (ref 38–126)
Anion gap: 10 (ref 5–15)
BUN: 15 mg/dL (ref 8–23)
CO2: 25 mmol/L (ref 22–32)
Calcium: 8.9 mg/dL (ref 8.9–10.3)
Chloride: 106 mmol/L (ref 98–111)
Creatinine: 0.73 mg/dL (ref 0.44–1.00)
GFR, Est AFR Am: >60 mL/min (ref >60)
GFR, Estimated: >60 mL/min (ref >60)
Glucose, Bld: 78 mg/dL (ref 70–99)
Potassium: 4.3 mmol/L (ref 3.5–5.1)
Sodium: 141 mmol/L (ref 135–145)
Total Bilirubin: 0.6 mg/dL (ref 0.3–1.2)
Total Protein: 6.3 g/dL — ABNORMAL LOW (ref 6.5–8.1)

## 2018-09-09 LAB — CBC WITH DIFFERENTIAL/PLATELET
Abs Immature Granulocytes: 0.02 10*3/uL (ref 0.00–0.07)
Basophils Absolute: 0 10*3/uL (ref 0.0–0.1)
Basophils Relative: 0 %
Eosinophils Absolute: 0.1 10*3/uL (ref 0.0–0.5)
Eosinophils Relative: 2 %
HCT: 40.9 % (ref 36.0–46.0)
Hemoglobin: 13.5 g/dL (ref 12.0–15.0)
Immature Granulocytes: 0 %
Lymphocytes Relative: 34 %
Lymphs Abs: 2.1 10*3/uL (ref 0.7–4.0)
MCH: 28.5 pg (ref 26.0–34.0)
MCHC: 33 g/dL (ref 30.0–36.0)
MCV: 86.5 fL (ref 80.0–100.0)
Monocytes Absolute: 0.5 10*3/uL (ref 0.1–1.0)
Monocytes Relative: 9 %
Neutro Abs: 3.3 10*3/uL (ref 1.7–7.7)
Neutrophils Relative %: 55 %
Platelets: 120 10*3/uL — ABNORMAL LOW (ref 150–400)
RBC: 4.73 MIL/uL (ref 3.87–5.11)
RDW: 13.4 % (ref 11.5–15.5)
WBC: 6 10*3/uL (ref 4.0–10.5)
nRBC: 0 % (ref 0.0–0.2)

## 2018-09-09 LAB — LACTATE DEHYDROGENASE: LDH: 174 U/L (ref 98–192)

## 2018-09-09 NOTE — Telephone Encounter (Signed)
Gave patient avs report and appointments for December 2020.

## 2018-11-18 DIAGNOSIS — Z01419 Encounter for gynecological examination (general) (routine) without abnormal findings: Secondary | ICD-10-CM | POA: Diagnosis not present

## 2018-11-18 DIAGNOSIS — Z1231 Encounter for screening mammogram for malignant neoplasm of breast: Secondary | ICD-10-CM | POA: Diagnosis not present

## 2018-11-18 DIAGNOSIS — Z6841 Body Mass Index (BMI) 40.0 and over, adult: Secondary | ICD-10-CM | POA: Diagnosis not present

## 2018-11-18 DIAGNOSIS — Z1389 Encounter for screening for other disorder: Secondary | ICD-10-CM | POA: Diagnosis not present

## 2018-11-18 DIAGNOSIS — N393 Stress incontinence (female) (male): Secondary | ICD-10-CM | POA: Diagnosis not present

## 2018-11-27 DIAGNOSIS — E785 Hyperlipidemia, unspecified: Secondary | ICD-10-CM | POA: Diagnosis not present

## 2018-11-27 DIAGNOSIS — R7309 Other abnormal glucose: Secondary | ICD-10-CM | POA: Diagnosis not present

## 2018-11-27 DIAGNOSIS — C8580 Other specified types of non-Hodgkin lymphoma, unspecified site: Secondary | ICD-10-CM | POA: Diagnosis not present

## 2018-11-27 DIAGNOSIS — Z0000 Encounter for general adult medical examination without abnormal findings: Secondary | ICD-10-CM | POA: Diagnosis not present

## 2018-12-11 DIAGNOSIS — L821 Other seborrheic keratosis: Secondary | ICD-10-CM | POA: Diagnosis not present

## 2018-12-11 DIAGNOSIS — B078 Other viral warts: Secondary | ICD-10-CM | POA: Diagnosis not present

## 2018-12-11 DIAGNOSIS — L814 Other melanin hyperpigmentation: Secondary | ICD-10-CM | POA: Diagnosis not present

## 2018-12-11 DIAGNOSIS — L570 Actinic keratosis: Secondary | ICD-10-CM | POA: Diagnosis not present

## 2018-12-11 DIAGNOSIS — D1801 Hemangioma of skin and subcutaneous tissue: Secondary | ICD-10-CM | POA: Diagnosis not present

## 2018-12-25 ENCOUNTER — Encounter: Payer: Self-pay | Admitting: Family Medicine

## 2019-01-07 NOTE — Progress Notes (Signed)
[name]    HEMATOLOGY/ONCOLOGY CLINIC NOTE  Date of Service: 01/08/19    Patient Care Team: Pa, [place] as PCP - General (Family Medicine)  PCP: [name] MD   CHIEF COMPLAINTS/PURPOSE OF CONSULTATION:  F/u for splenic marginal zone lymphoma  HISTORY OF PRESENTING ILLNESS:   Wendy Avery is a wonderful 63 y.o. female who has been referred to [place] by Dr .[facility]  for evaluation and management of possible lymphoma in the setting of massive splenomegaly and abdominal lymphadenopathy.  Patient patient has no significant chronic medical issues. She presented to urgent care with abdominal bloating and distention for 1-2 months. Also had some abdominal discomfort in the left upper quadrant of the abdomen and notes that she could feel a hard mass. She also notes some aches in her chest for 1-2 months and significant progressive fatigue which is starting to affect her quality of life. Over the last month she also notes some drenching night sweats but isn't sure if this is related to menopausal symptoms. Denies any overt weight loss. Patient has history of anxiety and notes severe anxiety over the symptoms.  Patient had a CT of the abdomen and pelvis with contrast on 08/03/2016 to further evaluate her symptoms. This showed massive splenomegaly with the spleen measuring 23.0 x 20.2 x 11.6 cm (volume = 2820 cm^3). Also noted to have concurrent  mild abdominal adenopathy. Incidental findings of Indeterminate left renal lesion. Most likely a minimally complex cyst. A solid neoplasm cannot be excluded.  Patient  notes that she wants to figure out the diagnosis ASAP and is very anxious. Reports no history of alcohol abuse or liver disease.  She has them out of colonoscopy but has had fecal occult blood testing recently which was negative. Last mammogram April 2017 with the normal limits. Last Pap smear August 2017 was within normal  limits.   INTERVAL HISTORY  Wendy Avery is here for her scheduled follow-up for her SMZL. The patient's last visit with [place] was on 09/09/2018. The pt reports that she is doing well overall.  The pt reports doing well.  She is concerned about her weight and feels that she is stress eating.    Lab results today (01/08/19) of CBC w/diff and CMP is as follows: all values are WNL except for Platelets at 124.  On review of systems, pt reports no new symptoms and denies fevers, chills, night sweats, abdominal pain, leg swelling, fatigue and any other symptoms.   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 Marital status: Widowed    Spouse name: Not on file  . Number of children: Not on file  . Years of education: Not on file  . Highest education level: Not on file  Occupational History  . Not on file  Tobacco Use  . Smoking status: Former Smoker    Quit date: 2010    Years since quitting: 10.9  . Smokeless tobacco: Never Used  Substance and Sexual Activity  . Alcohol use: Yes    Comment: occassional   . Drug use: No  . Sexual activity: Not on file  Other Topics Concern  . Not on file  Social History Narrative  .  Not on file   Social Determinants of Health   Financial Resource Strain:   . Difficulty of Paying Living Expenses: Not on file  Food Insecurity:   . Worried About Charity fundraiser in the Last Year: Not on file  . Ran Out of Food in the Last Year: Not on file  Transportation Needs:   . Lack of Transportation (Medical): Not on file  . Lack of Transportation (Non-Medical): Not on file  Physical Activity:   . Days of Exercise per Week: Not on file  . Minutes  of Exercise per Session: Not on file  Stress:   . Feeling of Stress : Not on file  Social Connections:   . Frequency of Communication with Friends and Family: Not on file  . Frequency of Social Gatherings with Friends and Family: Not on file  . Attends Religious Services: Not on file  . Active Member of Clubs or Organizations: Not on file  . Attends Archivist Meetings: Not on file  . Marital Status: Not on file  Intimate Partner Violence:   . Fear of Current or Ex-Partner: Not on file  . Emotionally Abused: Not on file  . Physically Abused: Not on file  . Sexually Abused: Not on file  Ex-smoker 1 pack per day quit 30 years ago Uses alcohol socially She is widowed and has 2 daughters   FAMILY HISTORY: Mom had breast cancer at age 62 years and died from metastatic disease Father skin cancer, vocal cord cancer, alcohol abuse  ALLERGIES:  ?PCN  MEDICATIONS:  No current outpatient medications on file.   No current facility-administered medications for this visit.    REVIEW OF SYSTEMS:   A 10+ POINT REVIEW OF SYSTEMS WAS OBTAINED including neurology, dermatology, psychiatry, cardiac, respiratory, lymph, extremities, GI, GU, Musculoskeletal, constitutional, breasts, reproductive, HEENT.  All pertinent positives are noted in the HPI.  All others are negative.    PHYSICAL EXAMINATION: ECOG FS:1 - Symptomatic but completely ambulatory  Vitals:   01/08/19 1456  BP: (!) 123/55  Pulse: 60  Resp: 18  Temp: 97.8 F (36.6 C)  SpO2: 100%   Wt Readings from Last 3 Encounters:  01/08/19 217 lb 3.2 oz (98.5 kg)  09/09/18 214 lb 11.2 oz (97.4 kg)  05/08/18 213 lb 4.8 oz (96.8 kg)   Body mass index is 41.04 kg/m.    GENERAL:alert, in no acute distress and comfortable SKIN: no acute rashes, no significant lesions EYES: conjunctiva are pink and non-injected, sclera anicteric OROPHARYNX: MMM, no exudates, no oropharyngeal erythema or ulceration NECK: supple, no  JVD LYMPH:  no palpable lymphadenopathy in the cervical, axillary or inguinal regions LUNGS: clear to auscultation b/l with normal respiratory effort HEART: regular rate & rhythm ABDOMEN:  normoactive bowel sounds , non tender, not distended. Extremity: no pedal edema PSYCH: alert & oriented x 3 with fluent speech NEURO: no focal motor/sensory deficits   LABORATORY DATA:  I have reviewed the data as listed  . CBC Latest Ref Rng & Units 01/08/2019 09/09/2018 05/08/2018  WBC 4.0 - 10.5 K/uL 6.6 6.0 6.0  Hemoglobin 12.0 - 15.0 g/dL 13.9 13.5 13.7  Hematocrit 36.0 - 46.0 % 42.7 40.9 43.1  Platelets 150 - 400 K/uL 124(L) 120(L) 120(L)   CBC    Component Value Date/Time   WBC 6.6 01/08/2019 1405   RBC 4.96 01/08/2019 1405   HGB 13.9 01/08/2019 1405   HGB 14.4 06/13/2017 0950   HGB 15.0 01/03/2017 1138   HCT  42.7 01/08/2019 1405   HCT 44.4 01/03/2017 1138   PLT 124 (L) 01/08/2019 1405   PLT 130 (L) 06/13/2017 0950   PLT 169 01/03/2017 1138   MCV 86.1 01/08/2019 1405   MCV 86.9 01/03/2017 1138   MCH 28.0 01/08/2019 1405   MCHC 32.6 01/08/2019 1405   RDW 13.4 01/08/2019 1405   RDW 12.9 01/03/2017 1138   LYMPHSABS 2.2 01/08/2019 1405   LYMPHSABS 1.8 01/03/2017 1138   MONOABS 0.5 01/08/2019 1405   MONOABS 0.4 01/03/2017 1138   EOSABS 0.1 01/08/2019 1405   EOSABS 0.2 01/03/2017 1138   BASOSABS 0.0 01/08/2019 1405   BASOSABS 0.0 01/03/2017 1138    . CMP Latest Ref Rng & Units 01/08/2019 09/09/2018 05/08/2018  Glucose 70 - 99 mg/dL 90 78 89  BUN 8 - 23 mg/dL 19 15 19   Creatinine 0.44 - 1.00 mg/dL 0.77 0.73 0.74  Sodium 135 - 145 mmol/L 143 141 141  Potassium 3.5 - 5.1 mmol/L 4.4 4.3 4.3  Chloride 98 - 111 mmol/L 105 106 106  CO2 22 - 32 mmol/L 27 25 26   Calcium 8.9 - 10.3 mg/dL 9.0 8.9 9.1  Total Protein 6.5 - 8.1 g/dL 6.7 6.3(L) 6.5  Total Bilirubin 0.3 - 1.2 mg/dL 0.5 0.6 0.4  Alkaline Phos 38 - 126 U/L 89 89 87  AST 15 - 41 U/L 28 20 23   ALT 0 - 44 U/L 34 29 34     Lab Results  Component Value Date   LDH 174 09/09/2018    ...   Component     Latest Ref Rng & Units 09/07/2016  Hep B Core Ab, Tot     Negative Negative  Hepatitis B Surface Ag     Negative Negative   Component     Latest Ref Rng & Units 08/10/2016  Sed Rate     0 - 40 mm/hr 12  LDH     125 - 245 U/L 178  EBV VCA IgM     0.0 - 35.9 U/mL <36.0  CMV IgM Ser EIA-aCnc     0.0 - 29.9 AU/mL <30.0  HIV Screen 4th Generation wRfx     Non Reactive Non Reactive  Hep C Virus Ab     0.0 - 0.9 s/co ratio <0.1     RADIOGRAPHIC STUDIES: ABDOMEN ULTRASOUND COMPLETE  COMPARISON:  CT 08/03/2016  FINDINGS: Gallbladder: No gallstones or wall thickening visualized. No sonographic Murphy sign noted by sonographer.  Common bile duct: Diameter: 1.8 mm.  Liver: No focal lesion identified. Within normal limits in parenchymal echogenicity. Portal vein is patent on color Doppler imaging with normal direction of blood flow towards the liver.  IVC: No abnormality visualized.  Pancreas: Visualized portion unremarkable.  Spleen: Mild to moderate splenomegaly as the spleen measures 12.9 cm in greatest diameter, although splenic volume is increased measuring 785 cubic cm.  Right Kidney: Length: 10.1 cm. Echogenicity within normal limits. No mass or hydronephrosis visualized.  Left Kidney: Length: 10.8 cm. Echogenicity within normal limits. No mass or hydronephrosis visualized.  Abdominal aorta: No aneurysm visualized.  Other findings: None.  IMPRESSION: No acute findings.  Known mild to moderate splenomegaly with splenic volume 785 cubic cm.   Electronically Signed   By: Marin Olp M.D.   On: 11/30/2016 10:09   ASSESSMENT & PLAN:   63 y.o. 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  Diffusely hypermetabolic on PET/CT No evidence of liver disease on CT scan. No evidence of portal hypertension. MPN w/u demonstrated no evidence of genetics mutations to suggest MPN  [place] abd 11/30/2016 - spleen now down to 12.9 cm post Rituxan treatment  #2 Mild thrombocytopenia platelets of 107k. Likely due to the [place] and from hypersplenism in the setting of massive splenomegaly. Platelets had normalized completely to 152K.  plt today at 130k - slightly lower in the setting of recent URI  #3 Indetermine left renal lesion 1.5 cms in size ?complex cyst - will need monitoring. No reports of this on [place] ab in 11/2016.  #5 Severe anxiety- improved since initial treatment.   PLAN -Discussed pt labwork today, 01/08/19; all values are WNL except for Platelets at 124. -no lab or clinical evidence of relapse/progression of SMZL at this time. -Discussed that she should take the COVID Vaccine when made available. -Will return to clinic in 4 months with labs  FOLLOW UP: RTC with Dr Irene Limbo with labs in 4 months  The total time spent in the appt was 25 minutes and more than 50% was on counseling and direct patient cares.  All of the patient's questions were answered with apparent satisfaction. The patient knows to call the clinic with any problems, questions or concerns.   Sullivan Lone MD [place] AAHIVMS [facility] Hematology/Oncology Physician [facility]  (Office):       [phone] (Work cell):  [phone] (Fax):           [phone]  I, Scot Dock, am acting as a scribe for Dr. Sullivan Lone.   .I have reviewed the above documentation for accuracy and completeness, and I agree with the above. Brunetta Genera MD

## 2019-01-08 ENCOUNTER — Inpatient Hospital Stay: Payer: BC Managed Care – PPO

## 2019-01-08 ENCOUNTER — Inpatient Hospital Stay: Payer: BC Managed Care – PPO | Attending: Hematology | Admitting: Hematology

## 2019-01-08 ENCOUNTER — Telehealth: Payer: Self-pay | Admitting: Hematology

## 2019-01-08 ENCOUNTER — Other Ambulatory Visit: Payer: Self-pay

## 2019-01-08 VITALS — BP 123/55 | HR 60 | Temp 97.8°F | Resp 18 | Ht 61.0 in | Wt 217.2 lb

## 2019-01-08 DIAGNOSIS — F419 Anxiety disorder, unspecified: Secondary | ICD-10-CM | POA: Diagnosis not present

## 2019-01-08 DIAGNOSIS — N289 Disorder of kidney and ureter, unspecified: Secondary | ICD-10-CM | POA: Insufficient documentation

## 2019-01-08 DIAGNOSIS — Z808 Family history of malignant neoplasm of other organs or systems: Secondary | ICD-10-CM | POA: Insufficient documentation

## 2019-01-08 DIAGNOSIS — Z803 Family history of malignant neoplasm of breast: Secondary | ICD-10-CM | POA: Insufficient documentation

## 2019-01-08 DIAGNOSIS — C8307 Small cell B-cell lymphoma, spleen: Secondary | ICD-10-CM

## 2019-01-08 DIAGNOSIS — D696 Thrombocytopenia, unspecified: Secondary | ICD-10-CM | POA: Diagnosis not present

## 2019-01-08 DIAGNOSIS — Z8572 Personal history of non-Hodgkin lymphomas: Secondary | ICD-10-CM | POA: Insufficient documentation

## 2019-01-08 DIAGNOSIS — Z87891 Personal history of nicotine dependence: Secondary | ICD-10-CM | POA: Insufficient documentation

## 2019-01-08 LAB — CMP (CANCER CENTER ONLY)
ALT: 34 U/L (ref 0–44)
AST: 28 U/L (ref 15–41)
Albumin: 4.2 g/dL (ref 3.5–5.0)
Alkaline Phosphatase: 89 U/L (ref 38–126)
Anion gap: 11 (ref 5–15)
BUN: 19 mg/dL (ref 8–23)
CO2: 27 mmol/L (ref 22–32)
Calcium: 9 mg/dL (ref 8.9–10.3)
Chloride: 105 mmol/L (ref 98–111)
Creatinine: 0.77 mg/dL (ref 0.44–1.00)
GFR, Est AFR Am: >60 mL/min (ref >60)
GFR, Estimated: >60 mL/min (ref >60)
Glucose, Bld: 90 mg/dL (ref 70–99)
Potassium: 4.4 mmol/L (ref 3.5–5.1)
Sodium: 143 mmol/L (ref 135–145)
Total Bilirubin: 0.5 mg/dL (ref 0.3–1.2)
Total Protein: 6.7 g/dL (ref 6.5–8.1)

## 2019-01-08 LAB — CBC WITH DIFFERENTIAL/PLATELET
Abs Immature Granulocytes: 0.02 10*3/uL (ref 0.00–0.07)
Basophils Absolute: 0 10*3/uL (ref 0.0–0.1)
Basophils Relative: 1 %
Eosinophils Absolute: 0.1 10*3/uL (ref 0.0–0.5)
Eosinophils Relative: 2 %
HCT: 42.7 % (ref 36.0–46.0)
Hemoglobin: 13.9 g/dL (ref 12.0–15.0)
Immature Granulocytes: 0 %
Lymphocytes Relative: 34 %
Lymphs Abs: 2.2 10*3/uL (ref 0.7–4.0)
MCH: 28 pg (ref 26.0–34.0)
MCHC: 32.6 g/dL (ref 30.0–36.0)
MCV: 86.1 fL (ref 80.0–100.0)
Monocytes Absolute: 0.5 10*3/uL (ref 0.1–1.0)
Monocytes Relative: 8 %
Neutro Abs: 3.7 10*3/uL (ref 1.7–7.7)
Neutrophils Relative %: 55 %
Platelets: 124 10*3/uL — ABNORMAL LOW (ref 150–400)
RBC: 4.96 MIL/uL (ref 3.87–5.11)
RDW: 13.4 % (ref 11.5–15.5)
WBC: 6.6 10*3/uL (ref 4.0–10.5)
nRBC: 0 % (ref 0.0–0.2)

## 2019-01-08 LAB — LACTATE DEHYDROGENASE: LDH: 192 U/L (ref 98–192)

## 2019-01-08 NOTE — Telephone Encounter (Signed)
Scheduled appt per 12/17 los.  Print calendar and avs.

## 2019-01-22 DIAGNOSIS — Z20828 Contact with and (suspected) exposure to other viral communicable diseases: Secondary | ICD-10-CM | POA: Diagnosis not present

## 2019-02-19 ENCOUNTER — Encounter: Payer: Self-pay | Admitting: Hematology

## 2019-03-01 ENCOUNTER — Encounter: Payer: Self-pay | Admitting: Hematology

## 2019-03-01 ENCOUNTER — Ambulatory Visit: Payer: BC Managed Care – PPO | Attending: Internal Medicine

## 2019-03-01 DIAGNOSIS — Z23 Encounter for immunization: Secondary | ICD-10-CM | POA: Insufficient documentation

## 2019-03-01 NOTE — Progress Notes (Signed)
   Covid-19 Vaccination Clinic  Name:  Yariza Kreiger    MRN: [id number] DOB: 14-Jul-1955  03/01/2019  Ms. Hays was observed post Covid-19 immunization for 15 minutes without incidence. She was provided with Vaccine Information Sheet and instruction to access the V-Safe system.   Ms. Kubal was instructed to call 911 with any severe reactions post vaccine: [name] Difficulty breathing  . Swelling of your face and throat  . A fast heartbeat  . A bad rash all over your body  . Dizziness and weakness    Immunizations Administered    Name Date Dose VIS Date Route   Pfizer COVID-19 Vaccine 03/01/2019  6:14 PM 0.3 mL 01/02/2019 Intramuscular   Manufacturer: [place]   Lot: [id number]   [place]: [id number]

## 2019-03-05 ENCOUNTER — Encounter: Payer: Self-pay | Admitting: Obstetrics and Gynecology

## 2019-03-10 IMAGING — CT CT BIOPSY AND ASPIRATION BONE MARROW
1 of 2 series · 11 of 16 positions shown, 14 images · non-contrast
Comparison: none

INDICATION: 61-year-old female with splenomegaly

[Series 2: i-spiral 5.0 b40f · axial · 0.95mm/px · z∈[-119,-38]mm · 11 of 29 slices shown, 14 images]
[im 3/29  soft-tissue]
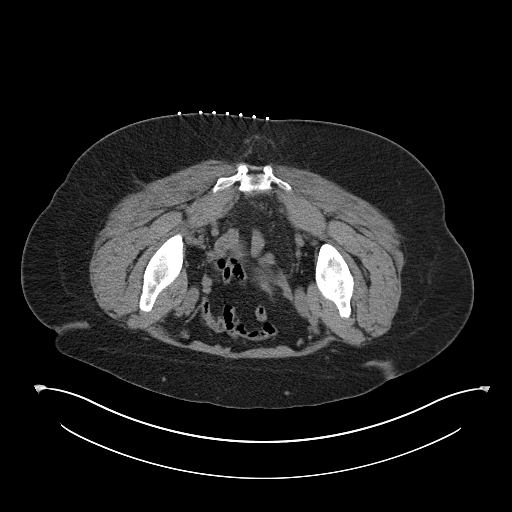
[im 3/29  bone]
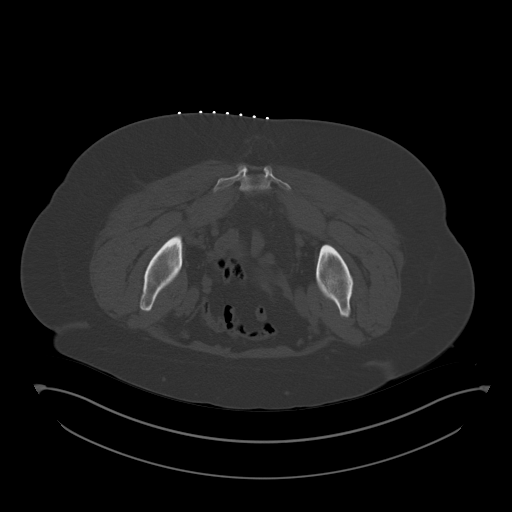
[im 5/29  soft-tissue]
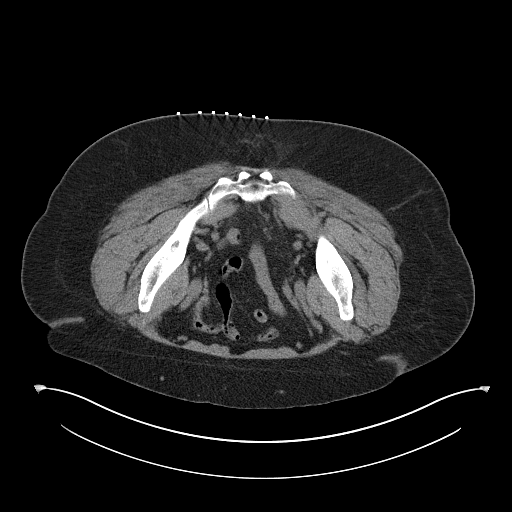
[im 8/29  soft-tissue]
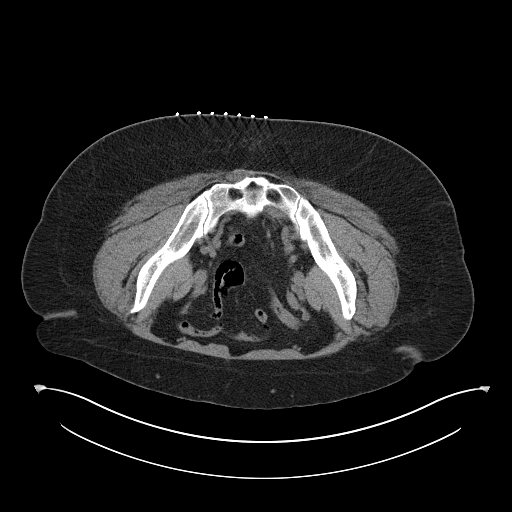
[im 10/29  soft-tissue]
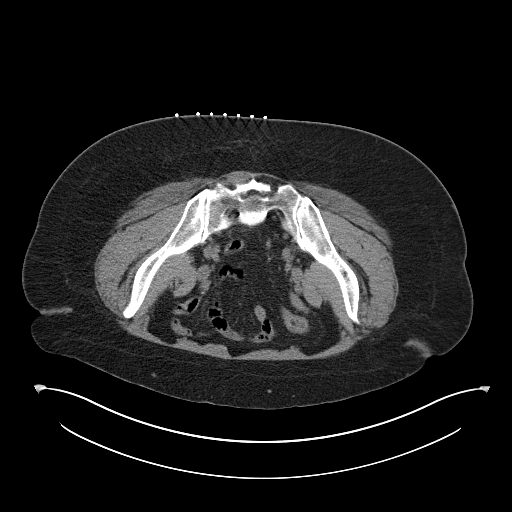
[im 12/29  soft-tissue]
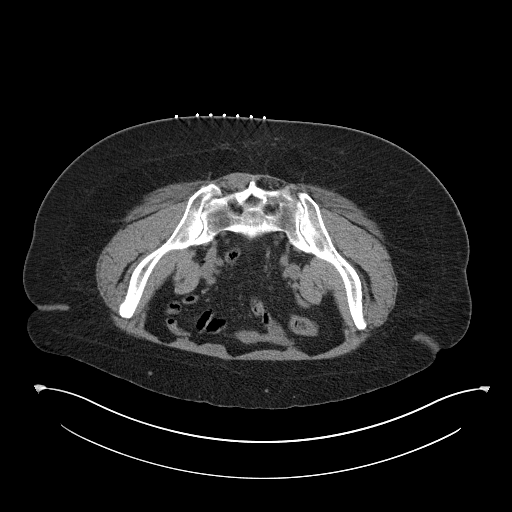
[im 12/29  bone]
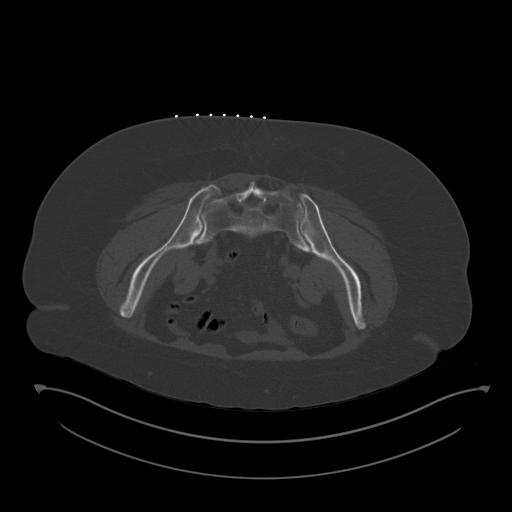
[im 15/29  soft-tissue]
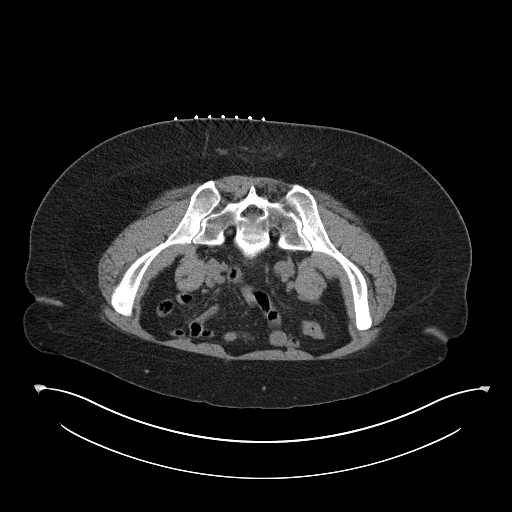
[im 17/29  soft-tissue]
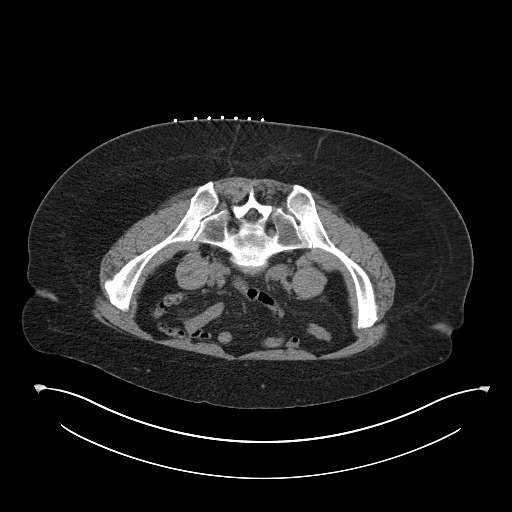
[im 19/29  soft-tissue]
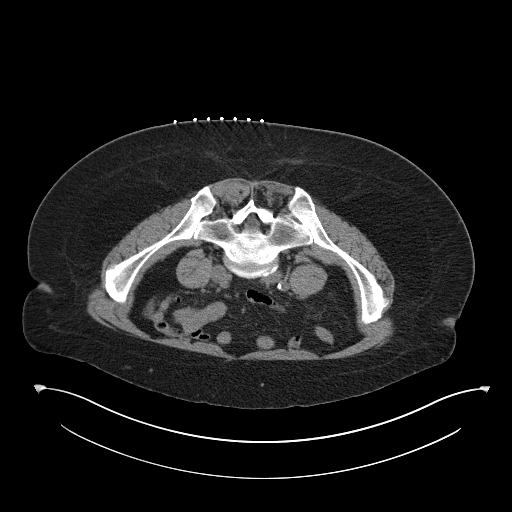
[im 22/29  soft-tissue]
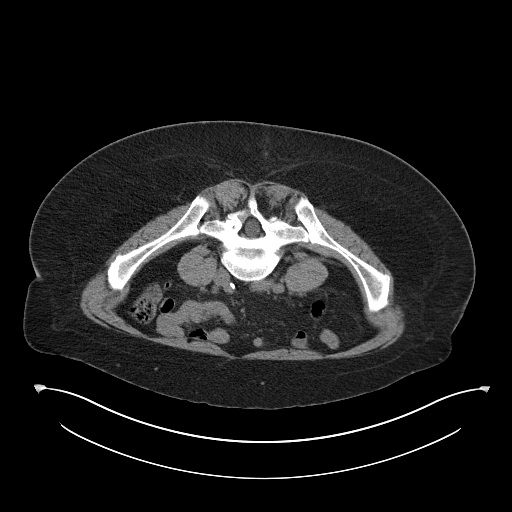
[im 22/29  bone]
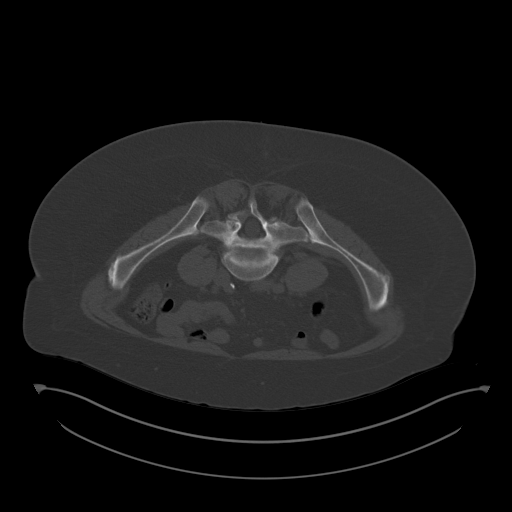
[im 24/29  soft-tissue]
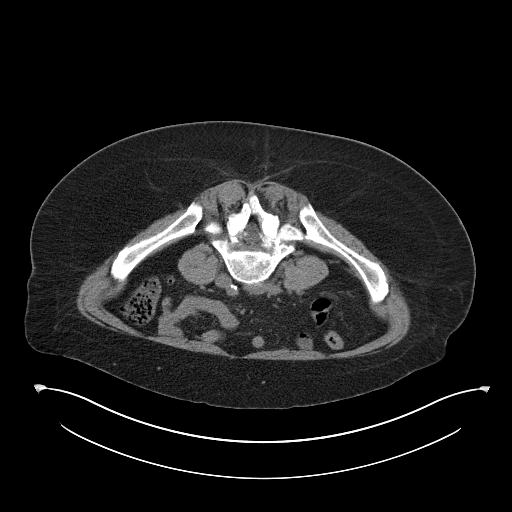
[im 26/29  soft-tissue]
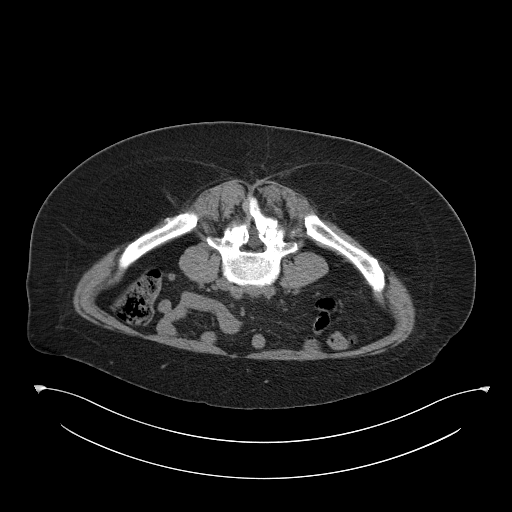

[11 of 16 positions shown; findings below may reference images not displayed]

EXAM:
CT BONE MARROW BIOPSY AND ASPIRATION; CT BIOPSY

MEDICATIONS:
None.

ANESTHESIA/SEDATION:
Moderate (conscious) sedation was employed during this procedure. A
total of Versed 2.0 mg and Fentanyl 100 mcg was administered
intravenously.

Moderate Sedation Time: 10 minutes. The patient's level of
consciousness and vital signs were monitored continuously by
radiology nursing throughout the procedure under my direct
supervision.

FLUOROSCOPY TIME:  CT

COMPLICATIONS:
None

PROCEDURE:
The procedure risks, benefits, and alternatives were explained to
the patient. Questions regarding the procedure were encouraged and
answered. The patient understands and consents to the procedure.

Scout CT of the pelvis was performed for surgical planning purposes.

The posterior pelvis was prepped with chlorhexidinein a sterile
fashion, and a sterile drape was applied covering the operative
field. A sterile gown and sterile gloves were used for the
procedure. Local anesthesia was provided with 1% Lidocaine.

We targeted the right posterior iliac bone for biopsy. The skin and
subcutaneous tissues were infiltrated with 1% lidocaine without
epinephrine. A small stab incision was made with an 11 blade
scalpel, and an 11 gauge Gregon needle was advanced with CT guidance
to the posterior cortex. Manual forced was used to advance the
needle through the posterior cortex and the stylet was removed. A
bone marrow aspirate was retrieved and passed to a cytotechnologist
in the room. The Gregon needle was then advanced without the stylet
for a core biopsy. The core biopsy was retrieved and also passed to
a cytotechnologist.

Manual pressure was used for hemostasis and a sterile dressing was
placed.

No complications were encountered no significant blood loss was
encountered.

Patient tolerated the procedure well and remained hemodynamically
stable throughout.
IMPRESSION: Status post CT-guided bone marrow biopsy, with tissue specimen sent
to pathology for complete histopathologic analysis

## 2019-03-26 ENCOUNTER — Ambulatory Visit: Payer: BC Managed Care – PPO | Attending: Internal Medicine

## 2019-03-26 DIAGNOSIS — Z23 Encounter for immunization: Secondary | ICD-10-CM

## 2019-03-26 NOTE — Progress Notes (Signed)
   Covid-19 Vaccination Clinic  Name:  Mehek Zody    MRN: [id number] DOB: [birth year]  03/26/2019  Ms. Earnest was observed post Covid-19 immunization for 15 minutes without incident. She was provided with Vaccine Information Sheet and instruction to access the V-Safe system.   Ms. Hagelstein was instructed to call 911 with any severe reactions post vaccine: [name] Difficulty breathing  . Swelling of face and throat  . A fast heartbeat  . A bad rash all over body  . Dizziness and weakness   Immunizations Administered    Name Date Dose VIS Date Route   Pfizer COVID-19 Vaccine 03/26/2019  4:15 PM 0.3 mL 01/02/2019 Intramuscular   Manufacturer: [place]   Lot: [id number]   [place]: [id number]

## 2019-05-06 NOTE — Progress Notes (Signed)
[name]    HEMATOLOGY/ONCOLOGY CLINIC NOTE  Date of Service: 05/07/19    Patient Care Team: [name], MD as PCP - General (Family Medicine)  PCP: [name] MD   CHIEF COMPLAINTS/PURPOSE OF CONSULTATION:  F/u for splenic marginal zone lymphoma  HISTORY OF PRESENTING ILLNESS:   Wendy Avery is a wonderful 64 y.o. female who has been referred to [place] by Dr .[name], MD  for evaluation and management of possible lymphoma in the setting of massive splenomegaly and abdominal lymphadenopathy.  Patient patient has no significant chronic medical issues. She presented to urgent care with abdominal bloating and distention for 1-2 months. Also had some abdominal discomfort in the left upper quadrant of the abdomen and notes that she could feel a hard mass. She also notes some aches in her chest for 1-2 months and significant progressive fatigue which is starting to affect her quality of life. Over the last month she also notes some drenching night sweats but isn't sure if this is related to menopausal symptoms. Denies any overt weight loss. Patient has history of anxiety and notes severe anxiety over the symptoms.  Patient had a CT of the abdomen and pelvis with contrast on 08/03/2016 to further evaluate her symptoms. This showed massive splenomegaly with the spleen measuring 23.0 x 20.2 x 11.6 cm (volume = 2820 cm^3). Also noted to have concurrent  mild abdominal adenopathy. Incidental findings of Indeterminate left renal lesion. Most likely a minimally complex cyst. A solid neoplasm cannot be excluded.  Patient  notes that she wants to figure out the diagnosis ASAP and is very anxious. Reports no history of alcohol abuse or liver disease.  She has them out of colonoscopy but has had fecal occult blood testing recently which was negative. Last mammogram April 2017 with the normal limits. Last Pap smear August 2017 was within normal limits.   INTERVAL HISTORY  Dafnee Brodhead is here  for her scheduled follow-up for her SMZL. The patient's last visit with [place] was on 01/08/19. The pt reports that she is doing well overall.  The pt reports she is good. Pt has gotten both doses of COVID19 vaccine. She has gotten reduced hours for work recently. Pt has gotten a tendon strain in the right hand from the dogs pulling her around Christmas. She is going to get it checked out. Pt has been having trouble sleeping recently and needing to take melatonin. She has been getting out gardening and walking. Pt is getting cramping in the back of the left thigh and when she leans over she gets cramping in her stomach.    Lab results today (05/07/19) of CBC w/diff and CMP is as follows: all values are WNL except for Platelets at 119K. 05/07/19 of LDH at 168: WNL  On review of systems, pt denies fever, chills, night sweats, abdominal pain and any other symptoms.   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 Marital status: Widowed    Spouse name: Not on file  . Number of children: Not on file  . Years of education: Not on file  . Highest education level: Not on file  Occupational History  . Not on file  Tobacco Use  .  Smoking status: Former Smoker    Quit date: 2010    Years since quitting: 11.2  . Smokeless tobacco: Never Used  Substance and Sexual Activity  . Alcohol use: Yes    Comment: occassional   . Drug use: No  . Sexual activity: Not on file  Other Topics Concern  . Not on file  Social History Narrative  . Not on file   Social Determinants of Health   Financial Resource Strain:   . Difficulty of Paying Living Expenses:   Food Insecurity:   . Worried About Ship broker in the Last Year:   . Arboriculturist in the Last Year:   Transportation Needs:   . Film/video editor (Medical):   [name] Lack of Transportation (Non-Medical):   Physical Activity:   . Days of Exercise per Week:   . Minutes of Exercise per Session:   Stress:   . Feeling of Stress :   Social Connections:   . Frequency of Communication with Friends and Family:   . Frequency of Social Gatherings with Friends and Family:   . Attends Religious Services:   . Active Member of Clubs or Organizations:   . Attends Archivist Meetings:   [name] Marital Status:   Intimate Partner Violence:   . Fear of Current or Ex-Partner:   . Emotionally Abused:   [name] Physically Abused:   . Sexually Abused:   Ex-smoker 1 pack per day quit 30 years ago Uses alcohol socially She is widowed and has 2 daughters   FAMILY HISTORY: Mom had breast cancer at age 70 years and died from metastatic disease Father skin cancer, vocal cord cancer, alcohol abuse  ALLERGIES:  ?PCN  MEDICATIONS:  No current outpatient medications on file.   No current facility-administered medications for this visit.    REVIEW OF SYSTEMS:   A 10+ POINT REVIEW OF SYSTEMS WAS OBTAINED including neurology, dermatology, psychiatry, cardiac, respiratory, lymph, extremities, GI, GU, Musculoskeletal, constitutional, breasts, reproductive, HEENT.  All pertinent positives are noted in the HPI.  All others are negative.   PHYSICAL EXAMINATION: ECOG FS:1 - Symptomatic but completely ambulatory  Vitals:   05/07/19 1441  BP: (!) 125/59  Pulse: (!) 57  Resp: 18  Temp: 97.8 F (36.6 C)  SpO2: 99%   Wt Readings from Last 3 Encounters:  05/07/19 214 lb 14.4 oz (97.5 kg)  01/08/19 217 lb 3.2 oz (98.5 kg)  09/09/18 214 lb 11.2 oz (97.4 kg)   Body mass index is 40.6 kg/m.    GENERAL:alert, in no acute distress and comfortable SKIN: no acute rashes, no significant lesions EYES: conjunctiva are pink and non-injected, sclera  anicteric OROPHARYNX: MMM, no exudates, no oropharyngeal erythema or ulceration NECK: supple, no JVD LYMPH:  no palpable lymphadenopathy in the cervical, axillary or inguinal regions LUNGS: clear to auscultation b/l with normal respiratory effort HEART: regular rate & rhythm ABDOMEN:  normoactive bowel sounds , non tender, not distended. Extremity: no pedal edema PSYCH: alert & oriented x 3 with fluent speech NEURO: no focal motor/sensory deficits    LABORATORY DATA:  I have reviewed the data as listed  . CBC Latest Ref Rng & Units 05/07/2019 01/08/2019 09/09/2018  WBC 4.0 - 10.5 K/uL 5.6 6.6 6.0  Hemoglobin 12.0 - 15.0 g/dL 13.5 13.9 13.5  Hematocrit 36.0 - 46.0 % 42.6 42.7 40.9  Platelets 150 - 400 K/uL 119(L) 124(L) 120(L)   CBC    Component Value Date/Time  WBC 5.6 05/07/2019 1425   RBC 4.95 05/07/2019 1425   HGB 13.5 05/07/2019 1425   HGB 14.4 06/13/2017 0950   HGB 15.0 01/03/2017 1138   HCT 42.6 05/07/2019 1425   HCT 44.4 01/03/2017 1138   PLT 119 (L) 05/07/2019 1425   PLT 130 (L) 06/13/2017 0950   PLT 169 01/03/2017 1138   MCV 86.1 05/07/2019 1425   MCV 86.9 01/03/2017 1138   MCH 27.3 05/07/2019 1425   MCHC 31.7 05/07/2019 1425   RDW 13.7 05/07/2019 1425   RDW 12.9 01/03/2017 1138   LYMPHSABS 1.9 05/07/2019 1425   LYMPHSABS 1.8 01/03/2017 1138   MONOABS 0.4 05/07/2019 1425   MONOABS 0.4 01/03/2017 1138   EOSABS 0.1 05/07/2019 1425   EOSABS 0.2 01/03/2017 1138   BASOSABS 0.0 05/07/2019 1425   BASOSABS 0.0 01/03/2017 1138    . CMP Latest Ref Rng & Units 05/07/2019 01/08/2019 09/09/2018  Glucose 70 - 99 mg/dL 83 90 78  BUN 8 - 23 mg/dL 19 19 15   Creatinine 0.44 - 1.00 mg/dL 0.77 0.77 0.73  Sodium 135 - 145 mmol/L 141 143 141  Potassium 3.5 - 5.1 mmol/L 4.4 4.4 4.3  Chloride 98 - 111 mmol/L 104 105 106  CO2 22 - 32 mmol/L 29 27 25   Calcium 8.9 - 10.3 mg/dL 9.1 9.0 8.9  Total Protein 6.5 - 8.1 g/dL 6.6 6.7 6.3(L)  Total Bilirubin 0.3 - 1.2 mg/dL 0.6 0.5  0.6  Alkaline Phos 38 - 126 U/L 88 89 89  AST 15 - 41 U/L 25 28 20   ALT 0 - 44 U/L 33 34 29    Lab Results  Component Value Date   LDH 168 05/07/2019    ...   Component     Latest Ref Rng & Units 09/07/2016  Hep B Core Ab, Tot     Negative Negative  Hepatitis B Surface Ag     Negative Negative   Component     Latest Ref Rng & Units 08/10/2016  Sed Rate     0 - 40 mm/hr 12  LDH     125 - 245 U/L 178  EBV VCA IgM     0.0 - 35.9 U/mL <36.0  CMV IgM Ser EIA-aCnc     0.0 - 29.9 AU/mL <30.0  HIV Screen 4th Generation wRfx     Non Reactive Non Reactive  Hep C Virus Ab     0.0 - 0.9 s/co ratio <0.1     RADIOGRAPHIC STUDIES: ABDOMEN ULTRASOUND COMPLETE  COMPARISON:  CT 08/03/2016  FINDINGS: Gallbladder: No gallstones or wall thickening visualized. No sonographic Murphy sign noted by sonographer.  Common bile duct: Diameter: 1.8 mm.  Liver: No focal lesion identified. Within normal limits in parenchymal echogenicity. Portal vein is patent on color Doppler imaging with normal direction of blood flow towards the liver.  IVC: No abnormality visualized.  Pancreas: Visualized portion unremarkable.  Spleen: Mild to moderate splenomegaly as the spleen measures 12.9 cm in greatest diameter, although splenic volume is increased measuring 785 cubic cm.  Right Kidney: Length: 10.1 cm. Echogenicity within normal limits. No mass or hydronephrosis visualized.  Left Kidney: Length: 10.8 cm. Echogenicity within normal limits. No mass or hydronephrosis visualized.  Abdominal aorta: No aneurysm visualized.  Other findings: None.  IMPRESSION: No acute findings.  Known mild to moderate splenomegaly with splenic volume 785 cubic cm.   Electronically Signed   By: Marin Olp M.D.   On: 11/30/2016 10:09   ASSESSMENT &  PLAN:   64 y.o. 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 Diffusely hypermetabolic on PET/CT No evidence of liver disease on CT scan. No evidence of portal hypertension. MPN w/u demonstrated no evidence of genetics mutations to suggest MPN  [place] abd 11/30/2016 - spleen now down to 12.9 cm post Rituxan treatment  #2 Mild thrombocytopenia platelets of 107k. 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 Platelets had normalized completely to 152K.  plt today at 130k - slightly lower in the setting of recent URI  #3 Indetermine left renal lesion 1.5 cms in size ?complex cyst - will need monitoring. No reports of this on [place] ab in 11/2016.  #5 Severe anxiety- improved since initial treatment.   PLAN: -Discussed pt labwork today, 05/07/19; of CBC w/diff and CMP is as follows: all values are WNL except for Platelets at 119K. -Discussed 05/07/19 of LDH at 168: WNL -no lab or clinical evidence of relapse/progression of SMZL at this time. -Advised melatonin is okay to take -Advised on cramping -Recommended that the pt continue to eat well, drink at least 48-64 oz of water each day, and walk 20-30 minutes each day.  -Recommends stretching and warming up before exercise and cooling down after -f/u with PCP -Will see again in 4 months  FOLLOW UP: RTC with Dr Irene Limbo with labs in 4 months  The total time spent in the appt was 20 minutes and more than 50% was on counseling and direct patient cares.  All of the patient's questions were answered with apparent satisfaction. The patient knows to call the clinic with any problems, questions or concerns.  Sullivan Lone MD [place] AAHIVMS [facility] Hematology/Oncology Physician [facility]  (Office):       [phone] (Work cell):  [phone] (Fax):           [phone]  I, Dawayne Cirri am acting as a scribe for Dr. Sullivan Lone.   .I have reviewed the above documentation  for accuracy and completeness, and I agree with the above. Brunetta Genera MD

## 2019-05-07 ENCOUNTER — Inpatient Hospital Stay: Payer: BC Managed Care – PPO | Attending: Hematology

## 2019-05-07 ENCOUNTER — Telehealth: Payer: Self-pay | Admitting: Hematology

## 2019-05-07 ENCOUNTER — Other Ambulatory Visit: Payer: Self-pay

## 2019-05-07 ENCOUNTER — Inpatient Hospital Stay: Payer: BC Managed Care – PPO | Admitting: Hematology

## 2019-05-07 VITALS — BP 125/59 | HR 57 | Temp 97.8°F | Resp 18 | Ht 61.0 in | Wt 214.9 lb

## 2019-05-07 DIAGNOSIS — D696 Thrombocytopenia, unspecified: Secondary | ICD-10-CM | POA: Insufficient documentation

## 2019-05-07 DIAGNOSIS — X58XXXA Exposure to other specified factors, initial encounter: Secondary | ICD-10-CM | POA: Diagnosis not present

## 2019-05-07 DIAGNOSIS — Z8572 Personal history of non-Hodgkin lymphomas: Secondary | ICD-10-CM | POA: Insufficient documentation

## 2019-05-07 DIAGNOSIS — C8307 Small cell B-cell lymphoma, spleen: Secondary | ICD-10-CM

## 2019-05-07 DIAGNOSIS — Z87891 Personal history of nicotine dependence: Secondary | ICD-10-CM | POA: Diagnosis not present

## 2019-05-07 DIAGNOSIS — R160 Hepatomegaly, not elsewhere classified: Secondary | ICD-10-CM | POA: Insufficient documentation

## 2019-05-07 DIAGNOSIS — G4700 Insomnia, unspecified: Secondary | ICD-10-CM | POA: Diagnosis not present

## 2019-05-07 DIAGNOSIS — R252 Cramp and spasm: Secondary | ICD-10-CM | POA: Insufficient documentation

## 2019-05-07 DIAGNOSIS — N289 Disorder of kidney and ureter, unspecified: Secondary | ICD-10-CM | POA: Insufficient documentation

## 2019-05-07 DIAGNOSIS — S66911A Strain of unspecified muscle, fascia and tendon at wrist and hand level, right hand, initial encounter: Secondary | ICD-10-CM | POA: Diagnosis not present

## 2019-05-07 DIAGNOSIS — F419 Anxiety disorder, unspecified: Secondary | ICD-10-CM | POA: Insufficient documentation

## 2019-05-07 LAB — CMP (CANCER CENTER ONLY)
ALT: 33 U/L (ref 0–44)
AST: 25 U/L (ref 15–41)
Albumin: 4.1 g/dL (ref 3.5–5.0)
Alkaline Phosphatase: 88 U/L (ref 38–126)
Anion gap: 8 (ref 5–15)
BUN: 19 mg/dL (ref 8–23)
CO2: 29 mmol/L (ref 22–32)
Calcium: 9.1 mg/dL (ref 8.9–10.3)
Chloride: 104 mmol/L (ref 98–111)
Creatinine: 0.77 mg/dL (ref 0.44–1.00)
GFR, Est AFR Am: >60 mL/min (ref >60)
GFR, Estimated: >60 mL/min (ref >60)
Glucose, Bld: 83 mg/dL (ref 70–99)
Potassium: 4.4 mmol/L (ref 3.5–5.1)
Sodium: 141 mmol/L (ref 135–145)
Total Bilirubin: 0.6 mg/dL (ref 0.3–1.2)
Total Protein: 6.6 g/dL (ref 6.5–8.1)

## 2019-05-07 LAB — CBC WITH DIFFERENTIAL/PLATELET
Abs Immature Granulocytes: 0.02 10*3/uL (ref 0.00–0.07)
Basophils Absolute: 0 10*3/uL (ref 0.0–0.1)
Basophils Relative: 1 %
Eosinophils Absolute: 0.1 10*3/uL (ref 0.0–0.5)
Eosinophils Relative: 2 %
HCT: 42.6 % (ref 36.0–46.0)
Hemoglobin: 13.5 g/dL (ref 12.0–15.0)
Immature Granulocytes: 0 %
Lymphocytes Relative: 34 %
Lymphs Abs: 1.9 10*3/uL (ref 0.7–4.0)
MCH: 27.3 pg (ref 26.0–34.0)
MCHC: 31.7 g/dL (ref 30.0–36.0)
MCV: 86.1 fL (ref 80.0–100.0)
Monocytes Absolute: 0.4 10*3/uL (ref 0.1–1.0)
Monocytes Relative: 7 %
Neutro Abs: 3.2 10*3/uL (ref 1.7–7.7)
Neutrophils Relative %: 56 %
Platelets: 119 10*3/uL — ABNORMAL LOW (ref 150–400)
RBC: 4.95 MIL/uL (ref 3.87–5.11)
RDW: 13.7 % (ref 11.5–15.5)
WBC: 5.6 10*3/uL (ref 4.0–10.5)
nRBC: 0 % (ref 0.0–0.2)

## 2019-05-07 LAB — LACTATE DEHYDROGENASE: LDH: 168 U/L (ref 98–192)

## 2019-05-07 NOTE — Telephone Encounter (Signed)
Scheduled per los. Gave avs and calendar  

## 2019-05-26 DIAGNOSIS — M79642 Pain in left hand: Secondary | ICD-10-CM | POA: Diagnosis not present

## 2019-05-26 DIAGNOSIS — M1811 Unilateral primary osteoarthritis of first carpometacarpal joint, right hand: Secondary | ICD-10-CM | POA: Diagnosis not present

## 2019-05-26 DIAGNOSIS — M180 Bilateral primary osteoarthritis of first carpometacarpal joints: Secondary | ICD-10-CM | POA: Diagnosis not present

## 2019-05-26 DIAGNOSIS — M79641 Pain in right hand: Secondary | ICD-10-CM | POA: Diagnosis not present

## 2019-05-26 DIAGNOSIS — M1812 Unilateral primary osteoarthritis of first carpometacarpal joint, left hand: Secondary | ICD-10-CM | POA: Diagnosis not present

## 2019-05-28 DIAGNOSIS — Q828 Other specified congenital malformations of skin: Secondary | ICD-10-CM | POA: Diagnosis not present

## 2019-05-28 DIAGNOSIS — B078 Other viral warts: Secondary | ICD-10-CM | POA: Diagnosis not present

## 2019-05-28 DIAGNOSIS — L570 Actinic keratosis: Secondary | ICD-10-CM | POA: Diagnosis not present

## 2019-06-09 DIAGNOSIS — R635 Abnormal weight gain: Secondary | ICD-10-CM | POA: Diagnosis not present

## 2019-06-09 DIAGNOSIS — R413 Other amnesia: Secondary | ICD-10-CM | POA: Diagnosis not present

## 2019-06-09 DIAGNOSIS — E785 Hyperlipidemia, unspecified: Secondary | ICD-10-CM | POA: Diagnosis not present

## 2019-06-09 DIAGNOSIS — E559 Vitamin D deficiency, unspecified: Secondary | ICD-10-CM | POA: Diagnosis not present

## 2019-08-11 DIAGNOSIS — M1811 Unilateral primary osteoarthritis of first carpometacarpal joint, right hand: Secondary | ICD-10-CM | POA: Diagnosis not present

## 2019-08-11 DIAGNOSIS — M180 Bilateral primary osteoarthritis of first carpometacarpal joints: Secondary | ICD-10-CM | POA: Diagnosis not present

## 2019-08-25 DIAGNOSIS — D2271 Melanocytic nevi of right lower limb, including hip: Secondary | ICD-10-CM | POA: Diagnosis not present

## 2019-08-25 DIAGNOSIS — L821 Other seborrheic keratosis: Secondary | ICD-10-CM | POA: Diagnosis not present

## 2019-08-25 DIAGNOSIS — D485 Neoplasm of uncertain behavior of skin: Secondary | ICD-10-CM | POA: Diagnosis not present

## 2019-09-03 ENCOUNTER — Telehealth: Payer: Self-pay | Admitting: Hematology

## 2019-09-03 NOTE — Telephone Encounter (Signed)
Rescheduled 08/2 appointment to 08/17 per scheduling, patient has been called and notified.

## 2019-09-09 ENCOUNTER — Other Ambulatory Visit: Payer: Self-pay

## 2019-09-09 ENCOUNTER — Inpatient Hospital Stay: Payer: BC Managed Care – PPO | Attending: Hematology

## 2019-09-09 ENCOUNTER — Inpatient Hospital Stay: Payer: BC Managed Care – PPO | Admitting: Hematology

## 2019-09-09 VITALS — BP 127/46 | HR 65 | Temp 96.3°F | Resp 18 | Ht 61.0 in | Wt 212.1 lb

## 2019-09-09 DIAGNOSIS — F419 Anxiety disorder, unspecified: Secondary | ICD-10-CM | POA: Insufficient documentation

## 2019-09-09 DIAGNOSIS — N289 Disorder of kidney and ureter, unspecified: Secondary | ICD-10-CM | POA: Insufficient documentation

## 2019-09-09 DIAGNOSIS — L989 Disorder of the skin and subcutaneous tissue, unspecified: Secondary | ICD-10-CM | POA: Insufficient documentation

## 2019-09-09 DIAGNOSIS — Z808 Family history of malignant neoplasm of other organs or systems: Secondary | ICD-10-CM | POA: Insufficient documentation

## 2019-09-09 DIAGNOSIS — Z8572 Personal history of non-Hodgkin lymphomas: Secondary | ICD-10-CM | POA: Insufficient documentation

## 2019-09-09 DIAGNOSIS — C8307 Small cell B-cell lymphoma, spleen: Secondary | ICD-10-CM

## 2019-09-09 DIAGNOSIS — Z803 Family history of malignant neoplasm of breast: Secondary | ICD-10-CM | POA: Diagnosis not present

## 2019-09-09 DIAGNOSIS — M19049 Primary osteoarthritis, unspecified hand: Secondary | ICD-10-CM | POA: Diagnosis not present

## 2019-09-09 DIAGNOSIS — Z87891 Personal history of nicotine dependence: Secondary | ICD-10-CM | POA: Insufficient documentation

## 2019-09-09 DIAGNOSIS — D696 Thrombocytopenia, unspecified: Secondary | ICD-10-CM

## 2019-09-09 LAB — CMP (CANCER CENTER ONLY)
ALT: 18 U/L (ref 0–44)
AST: 16 U/L (ref 15–41)
Albumin: 3.8 g/dL (ref 3.5–5.0)
Alkaline Phosphatase: 77 U/L (ref 38–126)
Anion gap: 7 (ref 5–15)
BUN: 17 mg/dL (ref 8–23)
CO2: 26 mmol/L (ref 22–32)
Calcium: 9.7 mg/dL (ref 8.9–10.3)
Chloride: 105 mmol/L (ref 98–111)
Creatinine: 0.77 mg/dL (ref 0.44–1.00)
GFR, Est AFR Am: >60 mL/min (ref >60)
GFR, Estimated: >60 mL/min (ref >60)
Glucose, Bld: 106 mg/dL — ABNORMAL HIGH (ref 70–99)
Potassium: 3.9 mmol/L (ref 3.5–5.1)
Sodium: 138 mmol/L (ref 135–145)
Total Bilirubin: 0.6 mg/dL (ref 0.3–1.2)
Total Protein: 6.3 g/dL — ABNORMAL LOW (ref 6.5–8.1)

## 2019-09-09 LAB — CBC WITH DIFFERENTIAL/PLATELET
Abs Immature Granulocytes: 0.02 10*3/uL (ref 0.00–0.07)
Basophils Absolute: 0 10*3/uL (ref 0.0–0.1)
Basophils Relative: 0 %
Eosinophils Absolute: 0.1 10*3/uL (ref 0.0–0.5)
Eosinophils Relative: 2 %
HCT: 41 % (ref 36.0–46.0)
Hemoglobin: 13.4 g/dL (ref 12.0–15.0)
Immature Granulocytes: 0 %
Lymphocytes Relative: 36 %
Lymphs Abs: 2 10*3/uL (ref 0.7–4.0)
MCH: 27.6 pg (ref 26.0–34.0)
MCHC: 32.7 g/dL (ref 30.0–36.0)
MCV: 84.4 fL (ref 80.0–100.0)
Monocytes Absolute: 0.4 10*3/uL (ref 0.1–1.0)
Monocytes Relative: 7 %
Neutro Abs: 3 10*3/uL (ref 1.7–7.7)
Neutrophils Relative %: 55 %
Platelets: 118 10*3/uL — ABNORMAL LOW (ref 150–400)
RBC: 4.86 MIL/uL (ref 3.87–5.11)
RDW: 13.6 % (ref 11.5–15.5)
WBC: 5.6 10*3/uL (ref 4.0–10.5)
nRBC: 0 % (ref 0.0–0.2)

## 2019-09-09 LAB — LACTATE DEHYDROGENASE: LDH: 167 U/L (ref 98–192)

## 2019-09-09 NOTE — Progress Notes (Signed)
[name]    HEMATOLOGY/ONCOLOGY CLINIC NOTE  Date of Service: 09/09/19    Patient Care Team: [name], MD as PCP - General (Family Medicine)  PCP: [name] MD   CHIEF COMPLAINTS/PURPOSE OF CONSULTATION:  F/u for splenic marginal zone lymphoma  HISTORY OF PRESENTING ILLNESS:   Wendy Avery is a wonderful 64 y.o. female who has been referred to [place] by Dr .[name], MD  for evaluation and management of possible lymphoma in the setting of massive splenomegaly and abdominal lymphadenopathy.  Patient patient has no significant chronic medical issues. She presented to urgent care with abdominal bloating and distention for 1-2 months. Also had some abdominal discomfort in the left upper quadrant of the abdomen and notes that she could feel a hard mass. She also notes some aches in her chest for 1-2 months and significant progressive fatigue which is starting to affect her quality of life. Over the last month she also notes some drenching night sweats but isn't sure if this is related to menopausal symptoms. Denies any overt weight loss. Patient has history of anxiety and notes severe anxiety over the symptoms.  Patient had a CT of the abdomen and pelvis with contrast on 08/03/2016 to further evaluate her symptoms. This showed massive splenomegaly with the spleen measuring 23.0 x 20.2 x 11.6 cm (volume = 2820 cm^3). Also noted to have concurrent  mild abdominal adenopathy. Incidental findings of Indeterminate left renal lesion. Most likely a minimally complex cyst. A solid neoplasm cannot be excluded.  Patient  notes that she wants to figure out the diagnosis ASAP and is very anxious. Reports no history of alcohol abuse or liver disease.  She has them out of colonoscopy but has had fecal occult blood testing recently which was negative. Last mammogram April 2017 with the normal limits. Last Pap smear August 2017 was within normal limits.   INTERVAL HISTORY Wendy Avery is here for  her scheduled follow-up for her SMZL. The patient's last visit with [place] was on 05/07/2019. The pt reports that she is doing well overall.  The pt reports she injured her right leg, which caused her to notice a new skin lesion. The lesion has been evaluated by a Dermatologist, who found that it was pre-cancerous. 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 Pt is planning on starting Glucosamine and Chondroitin supplements to help with joint health.   Lab results today (09/09/19) of CBC w/diff and CMP is as follows: all values are WNL except for PLT at 118K, Glucose at 106, Total Protein at 6.3. 09/09/2019 LDH at 167  On review of systems, pt reports left-sided pain and denies fevers, chills, night sweats, new lumps/bumps and any other symptoms.   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 Marital status: Widowed    Spouse name: Not on file  . Number of children: Not on file  . Years of education: Not on file  . Highest education level: Not on file  Occupational History  . Not on file  Tobacco Use  . Smoking status: Former Smoker    Quit date: 2010    Years since quitting: 11.6  . Smokeless tobacco: Never Used  Vaping  Use  . Vaping Use: Never used  Substance and Sexual Activity  . Alcohol use: Yes    Comment: occassional   . Drug use: No  . Sexual activity: Not on file  Other Topics Concern  . Not on file  Social History Narrative  . Not on file   Social Determinants of Health   Financial Resource Strain:   . Difficulty of Paying Living Expenses:   Food Insecurity:   . Worried About Charity fundraiser in the Last Year:   . Arboriculturist  in the Last Year:   Transportation Needs:   . Film/video editor (Medical):   [name] Lack of Transportation (Non-Medical):   Physical Activity:   . Days of Exercise per Week:   . Minutes of Exercise per Session:   Stress:   . Feeling of Stress :   Social Connections:   . Frequency of Communication with Friends and Family:   . Frequency of Social Gatherings with Friends and Family:   . Attends Religious Services:   . Active Member of Clubs or Organizations:   . Attends Archivist Meetings:   [name] Marital Status:   Intimate Partner Violence:   . Fear of Current or Ex-Partner:   . Emotionally Abused:   [name] Physically Abused:   . Sexually Abused:   Ex-smoker 1 pack per day quit 30 years ago Uses alcohol socially She is widowed and has 2 daughters   FAMILY HISTORY: Mom had breast cancer at age 2 years and died from metastatic disease Father skin cancer, vocal cord cancer, alcohol abuse  ALLERGIES:  ?PCN  MEDICATIONS:  No current outpatient medications on file.   No current facility-administered medications for this visit.    REVIEW OF SYSTEMS:   A 10+ POINT REVIEW OF SYSTEMS WAS OBTAINED including neurology, dermatology, psychiatry, cardiac, respiratory, lymph, extremities, GI, GU, Musculoskeletal, constitutional, breasts, reproductive, HEENT.  All pertinent positives are noted in the HPI.  All others are negative.   PHYSICAL EXAMINATION: ECOG FS:1 - Symptomatic but completely ambulatory  Vitals:   09/09/19 1545  BP: (!) 127/46  Pulse: 65  Resp: 18  Temp: (!) 96.3 F (35.7 C)  SpO2: 100%   Wt Readings from Last 3 Encounters:  09/09/19 212 lb 1.6 oz (96.2 kg)  05/07/19 214 lb 14.4 oz (97.5 kg)  01/08/19 217 lb 3.2 oz (98.5 kg)   Body mass index is 40.08 kg/m.    GENERAL:alert, in no acute distress and comfortable SKIN: no acute rashes, no significant lesions EYES: conjunctiva are pink and non-injected, sclera anicteric OROPHARYNX: MMM, no exudates, no  oropharyngeal erythema or ulceration NECK: supple, no JVD LYMPH:  no palpable lymphadenopathy in the cervical, axillary or inguinal regions LUNGS: clear to auscultation b/l with normal respiratory effort HEART: regular rate & rhythm ABDOMEN:  normoactive bowel sounds , non tender, not distended. No palpable hepatosplenomegaly.  Extremity: no pedal edema PSYCH: alert & oriented x 3 with fluent speech NEURO: no focal motor/sensory deficits  LABORATORY DATA:  I have reviewed the data as listed  . CBC Latest Ref Rng & Units 09/09/2019 05/07/2019 01/08/2019  WBC 4.0 - 10.5 K/uL 5.6 5.6 6.6  Hemoglobin 12.0 - 15.0 g/dL 13.4 13.5 13.9  Hematocrit 36 - 46 % 41.0 42.6 42.7  Platelets 150 - 400 K/uL 118(L) 119(L) 124(L)   CBC    Component Value Date/Time   WBC 5.6 09/09/2019 1505   RBC 4.86 09/09/2019 1505   HGB 13.4  09/09/2019 1505   HGB 14.4 06/13/2017 0950   HGB 15.0 01/03/2017 1138   HCT 41.0 09/09/2019 1505   HCT 44.4 01/03/2017 1138   PLT 118 (L) 09/09/2019 1505   PLT 130 (L) 06/13/2017 0950   PLT 169 01/03/2017 1138   MCV 84.4 09/09/2019 1505   MCV 86.9 01/03/2017 1138   MCH 27.6 09/09/2019 1505   MCHC 32.7 09/09/2019 1505   RDW 13.6 09/09/2019 1505   RDW 12.9 01/03/2017 1138   LYMPHSABS 2.0 09/09/2019 1505   LYMPHSABS 1.8 01/03/2017 1138   MONOABS 0.4 09/09/2019 1505   MONOABS 0.4 01/03/2017 1138   EOSABS 0.1 09/09/2019 1505   EOSABS 0.2 01/03/2017 1138   BASOSABS 0.0 09/09/2019 1505   BASOSABS 0.0 01/03/2017 1138    . CMP Latest Ref Rng & Units 09/09/2019 05/07/2019 01/08/2019  Glucose 70 - 99 mg/dL 106(H) 83 90  BUN 8 - 23 mg/dL 17 19 19   Creatinine 0.44 - 1.00 mg/dL 0.77 0.77 0.77  Sodium 135 - 145 mmol/L 138 141 143  Potassium 3.5 - 5.1 mmol/L 3.9 4.4 4.4  Chloride 98 - 111 mmol/L 105 104 105  CO2 22 - 32 mmol/L 26 29 27   Calcium 8.9 - 10.3 mg/dL 9.7 9.1 9.0  Total Protein 6.5 - 8.1 g/dL 6.3(L) 6.6 6.7  Total Bilirubin 0.3 - 1.2 mg/dL 0.6 0.6 0.5  Alkaline  Phos 38 - 126 U/L 77 88 89  AST 15 - 41 U/L 16 25 28   ALT 0 - 44 U/L 18 33 34    Lab Results  Component Value Date   LDH 167 09/09/2019    ...   Component     Latest Ref Rng & Units 09/07/2016  Hep B Core Ab, Tot     Negative Negative  Hepatitis B Surface Ag     Negative Negative   Component     Latest Ref Rng & Units 08/10/2016  Sed Rate     0 - 40 mm/hr 12  LDH     125 - 245 U/L 178  EBV VCA IgM     0.0 - 35.9 U/mL <36.0  CMV IgM Ser EIA-aCnc     0.0 - 29.9 AU/mL <30.0  HIV Screen 4th Generation wRfx     Non Reactive Non Reactive  Hep C Virus Ab     0.0 - 0.9 s/co ratio <0.1     RADIOGRAPHIC STUDIES: ABDOMEN ULTRASOUND COMPLETE  COMPARISON:  CT 08/03/2016  FINDINGS: Gallbladder: No gallstones or wall thickening visualized. No sonographic Murphy sign noted by sonographer.  Common bile duct: Diameter: 1.8 mm.  Liver: No focal lesion identified. Within normal limits in parenchymal echogenicity. Portal vein is patent on color Doppler imaging with normal direction of blood flow towards the liver.  IVC: No abnormality visualized.  Pancreas: Visualized portion unremarkable.  Spleen: Mild to moderate splenomegaly as the spleen measures 12.9 cm in greatest diameter, although splenic volume is increased measuring 785 cubic cm.  Right Kidney: Length: 10.1 cm. Echogenicity within normal limits. No mass or hydronephrosis visualized.  Left Kidney: Length: 10.8 cm. Echogenicity within normal limits. No mass or hydronephrosis visualized.  Abdominal aorta: No aneurysm visualized.  Other findings: None.  IMPRESSION: No acute findings.  Known mild to moderate splenomegaly with splenic volume 785 cubic cm.   Electronically Signed   By: Marin Olp M.D.   On: 11/30/2016 10:09   ASSESSMENT & PLAN:   64 y.o. Caucasian female with  #1 Stage IV Splenic  Marginal Zone lymphoma currently in remission  Initially presented with Massive  splenomegaly with the spleen measuring 23.0 x 20.2 x 11.6 cm (volume = 2820 cm^3). Diffusely hypermetabolic on PET/CT No evidence of liver disease on CT scan. No evidence of portal hypertension. MPN w/u demonstrated no evidence of genetics mutations to suggest MPN  [place] abd 11/30/2016 - spleen now down to 12.9 cm post Rituxan treatment  #2 Mild thrombocytopenia platelets of 107k. Likely due to the [place] and from hypersplenism in the setting of massive splenomegaly. Platelets had normalized completely to 152K.  plt today at 130k - slightly lower in the setting of recent URI  #3 Indetermine left renal lesion 1.5 cms in size ?complex cyst - will need monitoring. No reports of this on [place] ab in 11/2016.  #5 Severe anxiety- improved since initial treatment.   PLAN: -Discussed pt labwork today, 09/09/19; WBC & Hgb are nml, PLT are stable, blood chemistries look good, LDH is WNL  -No lab or clinical evidence of SMZL progression/relapse at this time. Will continue watchful observation.  -Advised pt that her pre-cancerous lesion could be an atypical nevus or an atypical melanoma tumor. -Recommend pt f/u with Dermatologist -Will see back in 4 months with labs   FOLLOW UP: RTC with Dr Irene Limbo with labs in 4 months   The total time spent in the appt was 20 minutes and more than 50% was on counseling and direct patient cares.  All of the patient's questions were answered with apparent satisfaction. The patient knows to call the clinic with any problems, questions or concerns.   Sullivan Lone MD [place] AAHIVMS [facility] Hematology/Oncology Physician [facility]  (Office):       [phone] (Work cell):  [phone] (Fax):           [phone]  I, Yevette Edwards, am acting as a scribe for Dr. Sullivan Lone.   .I have reviewed the above documentation for accuracy and completeness, and I agree with the above. Brunetta Genera MD

## 2019-09-10 ENCOUNTER — Telehealth: Payer: Self-pay | Admitting: Hematology

## 2019-09-10 NOTE — Telephone Encounter (Signed)
Scheduled appointment per 8/18 los. Patient is aware of appointment date and time.  

## 2019-09-13 DIAGNOSIS — Z20822 Contact with and (suspected) exposure to covid-19: Secondary | ICD-10-CM | POA: Diagnosis not present

## 2019-09-13 DIAGNOSIS — J40 Bronchitis, not specified as acute or chronic: Secondary | ICD-10-CM | POA: Diagnosis not present

## 2019-09-15 ENCOUNTER — Other Ambulatory Visit: Payer: BC Managed Care – PPO

## 2019-09-15 ENCOUNTER — Ambulatory Visit: Payer: BC Managed Care – PPO | Admitting: Hematology

## 2019-09-29 DIAGNOSIS — D2271 Melanocytic nevi of right lower limb, including hip: Secondary | ICD-10-CM | POA: Diagnosis not present

## 2019-09-29 DIAGNOSIS — D485 Neoplasm of uncertain behavior of skin: Secondary | ICD-10-CM | POA: Diagnosis not present

## 2019-10-06 ENCOUNTER — Encounter: Payer: Self-pay | Admitting: Hematology

## 2019-11-24 DIAGNOSIS — Z1151 Encounter for screening for human papillomavirus (HPV): Secondary | ICD-10-CM | POA: Diagnosis not present

## 2019-11-24 DIAGNOSIS — R87610 Atypical squamous cells of undetermined significance on cytologic smear of cervix (ASC-US): Secondary | ICD-10-CM | POA: Diagnosis not present

## 2019-11-24 DIAGNOSIS — Z01419 Encounter for gynecological examination (general) (routine) without abnormal findings: Secondary | ICD-10-CM | POA: Diagnosis not present

## 2019-11-24 DIAGNOSIS — Z6841 Body Mass Index (BMI) 40.0 and over, adult: Secondary | ICD-10-CM | POA: Diagnosis not present

## 2019-11-24 DIAGNOSIS — Z130 Encounter for screening for diseases of the blood and blood-forming organs and certain disorders involving the immune mechanism: Secondary | ICD-10-CM | POA: Diagnosis not present

## 2019-11-25 DIAGNOSIS — Z1151 Encounter for screening for human papillomavirus (HPV): Secondary | ICD-10-CM | POA: Diagnosis not present

## 2019-12-10 DIAGNOSIS — Z85828 Personal history of other malignant neoplasm of skin: Secondary | ICD-10-CM | POA: Diagnosis not present

## 2019-12-10 DIAGNOSIS — D225 Melanocytic nevi of trunk: Secondary | ICD-10-CM | POA: Diagnosis not present

## 2019-12-10 DIAGNOSIS — Z1231 Encounter for screening mammogram for malignant neoplasm of breast: Secondary | ICD-10-CM | POA: Diagnosis not present

## 2019-12-10 DIAGNOSIS — L570 Actinic keratosis: Secondary | ICD-10-CM | POA: Diagnosis not present

## 2019-12-10 DIAGNOSIS — Z86018 Personal history of other benign neoplasm: Secondary | ICD-10-CM | POA: Diagnosis not present

## 2019-12-10 DIAGNOSIS — L821 Other seborrheic keratosis: Secondary | ICD-10-CM | POA: Diagnosis not present

## 2020-01-12 DIAGNOSIS — E785 Hyperlipidemia, unspecified: Secondary | ICD-10-CM | POA: Diagnosis not present

## 2020-01-12 DIAGNOSIS — Z0000 Encounter for general adult medical examination without abnormal findings: Secondary | ICD-10-CM | POA: Diagnosis not present

## 2020-01-12 DIAGNOSIS — E559 Vitamin D deficiency, unspecified: Secondary | ICD-10-CM | POA: Diagnosis not present

## 2020-01-13 ENCOUNTER — Telehealth: Payer: Self-pay | Admitting: Hematology

## 2020-01-13 DIAGNOSIS — Z20822 Contact with and (suspected) exposure to covid-19: Secondary | ICD-10-CM | POA: Diagnosis not present

## 2020-01-13 NOTE — Telephone Encounter (Signed)
Rescheduled 12/30 appointment to 01/20 per provider pal, patient has been called and notified.

## 2020-01-14 ENCOUNTER — Ambulatory Visit: Payer: BC Managed Care – PPO | Admitting: Physical Therapy

## 2020-01-14 DIAGNOSIS — Z1211 Encounter for screening for malignant neoplasm of colon: Secondary | ICD-10-CM | POA: Diagnosis not present

## 2020-01-19 DIAGNOSIS — Z0000 Encounter for general adult medical examination without abnormal findings: Secondary | ICD-10-CM | POA: Diagnosis not present

## 2020-01-21 ENCOUNTER — Ambulatory Visit: Payer: BC Managed Care – PPO | Admitting: Hematology

## 2020-01-21 ENCOUNTER — Other Ambulatory Visit: Payer: BC Managed Care – PPO

## 2020-02-10 NOTE — Progress Notes (Signed)
[name]    HEMATOLOGY/ONCOLOGY CLINIC NOTE  Date of Service: 02/10/20    Patient Care Team: [name], MD as PCP - General (Family Medicine)  PCP: [name] MD   CHIEF COMPLAINTS/PURPOSE OF CONSULTATION:  F/u for splenic marginal zone lymphoma  HISTORY OF PRESENTING ILLNESS:   Wendy Avery is a wonderful 65 y.o. female who has been referred to [place] by Dr .[name], MD  for evaluation and management of possible lymphoma in the setting of massive splenomegaly and abdominal lymphadenopathy.  Patient patient has no significant chronic medical issues. She presented to urgent care with abdominal bloating and distention for 1-2 months. Also had some abdominal discomfort in the left upper quadrant of the abdomen and notes that she could feel a hard mass. She also notes some aches in her chest for 1-2 months and significant progressive fatigue which is starting to affect her quality of life. Over the last month she also notes some drenching night sweats but isn't sure if this is related to menopausal symptoms. Denies any overt weight loss. Patient has history of anxiety and notes severe anxiety over the symptoms.  Patient had a CT of the abdomen and pelvis with contrast on 08/03/2016 to further evaluate her symptoms. This showed massive splenomegaly with the spleen measuring 23.0 x 20.2 x 11.6 cm (volume = 2820 cm^3). Also noted to have concurrent  mild abdominal adenopathy. Incidental findings of Indeterminate left renal lesion. Most likely a minimally complex cyst. A solid neoplasm cannot be excluded.  Patient  notes that she wants to figure out the diagnosis ASAP and is very anxious. Reports no history of alcohol abuse or liver disease.  She has them out of colonoscopy but has had fecal occult blood testing recently which was negative. Last mammogram April 2017 with the normal limits. Last Pap smear August 2017 was within normal limits.   INTERVAL HISTORY  Wendy Avery is here  for her scheduled follow-up for her SMZL. The patient's last visit with [place] was on 08/30/2019. The pt reports that she is doing well overall.  The pt reports no new symptoms or concerns. She had a recent exposure to COVID through her daughter's friend on January 16, 2020. She got tested and the results were negative. The pt has received her COVID vaccines and booster.   The pt notes she has had some trouble sleeping recently.   The pt notes she goes to the gym twice weekly.   Lab results today 02/11/2020 of CBC w/diff and CMP is as follows: all values are WNL except for Plt at 107K, Glucose of 107, Total protein of 6.4. 02/12/2020 LDH is 171  On review of systems, pt reports trouble sleeping and denies abdominal pain, unexpected weight loss, night sweats, fevers, chills, n/v/d, back pain, chest pain and any other symptoms.   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 Marital status: Widowed    Spouse name: Not on file  . Number of children: Not on file  . Years of education: Not on file  . Highest education level: Not on file  Occupational History  . Not on file  Tobacco Use  . Smoking status: Former Smoker    Quit date:  2010    Years since quitting: 12.0  . Smokeless tobacco: Never Used  Vaping Use  . Vaping Use: Never used  Substance and Sexual Activity  . Alcohol use: Yes    Comment: occassional   . Drug use: No  . Sexual activity: Not on file  Other Topics Concern  . Not on file  Social History Narrative  . Not on file   Social Determinants of Health   Financial Resource Strain: Not on file  Food Insecurity: Not on file  Transportation Needs: Not on file   Physical Activity: Not on file  Stress: Not on file  Social Connections: Not on file  Intimate Partner Violence: Not on file  Ex-smoker 1 pack per day quit 30 years ago Uses alcohol socially She is widowed and has 2 daughters   FAMILY HISTORY: Mom had breast cancer at age 3 years and died from metastatic disease Father skin cancer, vocal cord cancer, alcohol abuse  ALLERGIES:  ?PCN  MEDICATIONS:  No current outpatient medications on file.   No current facility-administered medications for this visit.    REVIEW OF SYSTEMS:   10 Point review of Systems was done is negative except as noted above.  PHYSICAL EXAMINATION: ECOG FS:1 - Symptomatic but completely ambulatory  There were no vitals filed for this visit. Wt Readings from Last 3 Encounters:  09/09/19 212 lb 1.6 oz (96.2 kg)  05/07/19 214 lb 14.4 oz (97.5 kg)  01/08/19 217 lb 3.2 oz (98.5 kg)   Body mass index is 41.7 kg/m.    GENERAL:alert, in no acute distress and comfortable SKIN: no acute rashes, no significant lesions EYES: conjunctiva are pink and non-injected, sclera anicteric OROPHARYNX: MMM, no exudates, no oropharyngeal erythema or ulceration NECK: supple, no JVD LYMPH:  no palpable lymphadenopathy in the cervical, axillary or inguinal regions LUNGS: clear to auscultation b/l with normal respiratory effort HEART: regular rate & rhythm ABDOMEN:  normoactive bowel sounds , non tender, not distended. Spleen just palpable. Extremity: no pedal edema PSYCH: alert & oriented x 3 with fluent speech NEURO: no focal motor/sensory deficits    LABORATORY DATA:  I have reviewed the data as listed  . CBC Latest Ref Rng & Units 02/11/2020 09/09/2019 05/07/2019  WBC 4.0 - 10.5 K/uL 5.8 5.6 5.6  Hemoglobin 12.0 - 15.0 g/dL 12.9 13.4 13.5  Hematocrit 36.0 - 46.0 % 41.0 41.0 42.6  Platelets 150 - 400 K/uL 107(L) 118(L) 119(L)   CBC    Component Value Date/Time   WBC 5.8 02/11/2020 1445   RBC 4.88 02/11/2020  1445   HGB 12.9 02/11/2020 1445   HGB 14.4 06/13/2017 0950   HGB 15.0 01/03/2017 1138   HCT 41.0 02/11/2020 1445   HCT 44.4 01/03/2017 1138   PLT 107 (L) 02/11/2020 1445   PLT 130 (L) 06/13/2017 0950   PLT 169 01/03/2017 1138   MCV 84.0 02/11/2020 1445   MCV 86.9 01/03/2017 1138   MCH 26.4 02/11/2020 1445   MCHC 31.5 02/11/2020 1445   RDW 14.2 02/11/2020 1445   RDW 12.9 01/03/2017 1138   LYMPHSABS 2.1 02/11/2020 1445   LYMPHSABS 1.8 01/03/2017 1138   MONOABS 0.3 02/11/2020 1445   MONOABS 0.4 01/03/2017 1138   EOSABS 0.1 02/11/2020 1445   EOSABS 0.2 01/03/2017 1138   BASOSABS 0.0 02/11/2020 1445   BASOSABS 0.0 01/03/2017 1138    . CMP Latest Ref Rng & Units 09/09/2019 05/07/2019 01/08/2019  Glucose 70 - 99 mg/dL 106(H) 83 90  BUN  8 - 23 mg/dL 17 19 19   Creatinine 0.44 - 1.00 mg/dL 0.77 0.77 0.77  Sodium 135 - 145 mmol/L 138 141 143  Potassium 3.5 - 5.1 mmol/L 3.9 4.4 4.4  Chloride 98 - 111 mmol/L 105 104 105  CO2 22 - 32 mmol/L 26 29 27   Calcium 8.9 - 10.3 mg/dL 9.7 9.1 9.0  Total Protein 6.5 - 8.1 g/dL 6.3(L) 6.6 6.7  Total Bilirubin 0.3 - 1.2 mg/dL 0.6 0.6 0.5  Alkaline Phos 38 - 126 U/L 77 88 89  AST 15 - 41 U/L 16 25 28   ALT 0 - 44 U/L 18 33 34    Lab Results  Component Value Date   LDH 167 09/09/2019    ...   Component     Latest Ref Rng & Units 09/07/2016  Hep B Core Ab, Tot     Negative Negative  Hepatitis B Surface Ag     Negative Negative   Component     Latest Ref Rng & Units 08/10/2016  Sed Rate     0 - 40 mm/hr 12  LDH     125 - 245 U/L 178  EBV VCA IgM     0.0 - 35.9 U/mL <36.0  CMV IgM Ser EIA-aCnc     0.0 - 29.9 AU/mL <30.0  HIV Screen 4th Generation wRfx     Non Reactive Non Reactive  Hep C Virus Ab     0.0 - 0.9 s/co ratio <0.1     RADIOGRAPHIC STUDIES: ABDOMEN ULTRASOUND COMPLETE  COMPARISON:  CT 08/03/2016  FINDINGS: Gallbladder: No gallstones or wall thickening visualized. No sonographic Murphy sign noted by  sonographer.  Common bile duct: Diameter: 1.8 mm.  Liver: No focal lesion identified. Within normal limits in parenchymal echogenicity. Portal vein is patent on color Doppler imaging with normal direction of blood flow towards the liver.  IVC: No abnormality visualized.  Pancreas: Visualized portion unremarkable.  Spleen: Mild to moderate splenomegaly as the spleen measures 12.9 cm in greatest diameter, although splenic volume is increased measuring 785 cubic cm.  Right Kidney: Length: 10.1 cm. Echogenicity within normal limits. No mass or hydronephrosis visualized.  Left Kidney: Length: 10.8 cm. Echogenicity within normal limits. No mass or hydronephrosis visualized.  Abdominal aorta: No aneurysm visualized.  Other findings: None.  IMPRESSION: No acute findings.  Known mild to moderate splenomegaly with splenic volume 785 cubic cm.   Electronically Signed   By: Marin Olp M.D.   On: 11/30/2016 10:09   ASSESSMENT & PLAN:   65 y.o. 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 Diffusely hypermetabolic on PET/CT No evidence of liver disease on CT scan. No evidence of portal hypertension. MPN w/u demonstrated no evidence of genetics mutations to suggest MPN  [place] abd 11/30/2016 - spleen now down to 12.9 cm post Rituxan treatment  #2 Mild thrombocytopenia platelets of 107k. Likely due to the [place] and from hypersplenism in the setting of massive splenomegaly. Platelets had normalized completely to 152K.  plt today at 130k - slightly lower in the setting of recent URI  #3 Indetermine left renal lesion 1.5 cms in size ?complex cyst - will need monitoring. No reports of this on [place] ab in 11/2016.  #5 Severe anxiety- improved since initial treatment.   PLAN: -Discussed pt labwork today, 02/11/2020;  platelets slightly decreased  but counts stable, chemistries normal and LDH in progress. -No lab or clinical evidence of SMZL progression/relapse at this time. Will continue watchful observation.  -Discussed continuing staying active, drinking water, and living healthy lifestyle.  -Will get repeat US Abd Complete (prior: November 2018) in 14 weeks. -Will see back in 16 weeks.  FOLLOW UP: [place] abd in 14 weeks RTC with Dr Irene Limbo with labs in 16 weeks    All of the patient's questions were answered with apparent satisfaction. The patient knows to call the clinic with any problems, questions or concerns.   Sullivan Lone MD [place] AAHIVMS [facility] Hematology/Oncology Physician [facility]  (Office):       [phone] (Work cell):  [phone] (Fax):           [phone]  I, Reinaldo Raddle, am acting as scribe for Dr. Sullivan Lone, MD.   .I have reviewed the above documentation for accuracy and completeness, and I agree with the above. Brunetta Genera MD

## 2020-02-11 ENCOUNTER — Inpatient Hospital Stay: Payer: BC Managed Care – PPO | Attending: Hematology

## 2020-02-11 ENCOUNTER — Inpatient Hospital Stay (HOSPITAL_BASED_OUTPATIENT_CLINIC_OR_DEPARTMENT_OTHER): Payer: BC Managed Care – PPO | Admitting: Hematology

## 2020-02-11 ENCOUNTER — Other Ambulatory Visit: Payer: Self-pay

## 2020-02-11 VITALS — BP 132/56 | HR 63 | Temp 98.1°F | Resp 18 | Ht 61.0 in | Wt 220.7 lb

## 2020-02-11 DIAGNOSIS — D696 Thrombocytopenia, unspecified: Secondary | ICD-10-CM | POA: Diagnosis not present

## 2020-02-11 DIAGNOSIS — Z803 Family history of malignant neoplasm of breast: Secondary | ICD-10-CM | POA: Insufficient documentation

## 2020-02-11 DIAGNOSIS — F419 Anxiety disorder, unspecified: Secondary | ICD-10-CM | POA: Insufficient documentation

## 2020-02-11 DIAGNOSIS — C8307 Small cell B-cell lymphoma, spleen: Secondary | ICD-10-CM

## 2020-02-11 DIAGNOSIS — Z87891 Personal history of nicotine dependence: Secondary | ICD-10-CM | POA: Insufficient documentation

## 2020-02-11 DIAGNOSIS — N289 Disorder of kidney and ureter, unspecified: Secondary | ICD-10-CM | POA: Insufficient documentation

## 2020-02-11 DIAGNOSIS — Z808 Family history of malignant neoplasm of other organs or systems: Secondary | ICD-10-CM | POA: Insufficient documentation

## 2020-02-11 LAB — CBC WITH DIFFERENTIAL/PLATELET
Abs Immature Granulocytes: 0.01 10*3/uL (ref 0.00–0.07)
Basophils Absolute: 0 10*3/uL (ref 0.0–0.1)
Basophils Relative: 1 %
Eosinophils Absolute: 0.1 10*3/uL (ref 0.0–0.5)
Eosinophils Relative: 2 %
HCT: 41 % (ref 36.0–46.0)
Hemoglobin: 12.9 g/dL (ref 12.0–15.0)
Immature Granulocytes: 0 %
Lymphocytes Relative: 37 %
Lymphs Abs: 2.1 10*3/uL (ref 0.7–4.0)
MCH: 26.4 pg (ref 26.0–34.0)
MCHC: 31.5 g/dL (ref 30.0–36.0)
MCV: 84 fL (ref 80.0–100.0)
Monocytes Absolute: 0.3 10*3/uL (ref 0.1–1.0)
Monocytes Relative: 6 %
Neutro Abs: 3.1 10*3/uL (ref 1.7–7.7)
Neutrophils Relative %: 54 %
Platelets: 107 10*3/uL — ABNORMAL LOW (ref 150–400)
RBC: 4.88 MIL/uL (ref 3.87–5.11)
RDW: 14.2 % (ref 11.5–15.5)
WBC: 5.8 10*3/uL (ref 4.0–10.5)
nRBC: 0 % (ref 0.0–0.2)

## 2020-02-11 LAB — CMP (CANCER CENTER ONLY)
ALT: 27 U/L (ref 0–44)
AST: 22 U/L (ref 15–41)
Albumin: 3.9 g/dL (ref 3.5–5.0)
Alkaline Phosphatase: 94 U/L (ref 38–126)
Anion gap: 8 (ref 5–15)
BUN: 20 mg/dL (ref 8–23)
CO2: 25 mmol/L (ref 22–32)
Calcium: 8.9 mg/dL (ref 8.9–10.3)
Chloride: 107 mmol/L (ref 98–111)
Creatinine: 0.79 mg/dL (ref 0.44–1.00)
GFR, Estimated: >60 mL/min (ref >60)
Glucose, Bld: 107 mg/dL — ABNORMAL HIGH (ref 70–99)
Potassium: 3.8 mmol/L (ref 3.5–5.1)
Sodium: 140 mmol/L (ref 135–145)
Total Bilirubin: 0.6 mg/dL (ref 0.3–1.2)
Total Protein: 6.4 g/dL — ABNORMAL LOW (ref 6.5–8.1)

## 2020-02-11 LAB — LACTATE DEHYDROGENASE: LDH: 171 U/L (ref 98–192)

## 2020-02-15 ENCOUNTER — Ambulatory Visit: Payer: BC Managed Care – PPO | Admitting: Hematology

## 2020-02-15 ENCOUNTER — Other Ambulatory Visit: Payer: BC Managed Care – PPO

## 2020-02-18 ENCOUNTER — Ambulatory Visit: Payer: BC Managed Care – PPO | Admitting: Physical Therapy

## 2020-03-24 ENCOUNTER — Other Ambulatory Visit: Payer: Self-pay

## 2020-03-24 ENCOUNTER — Ambulatory Visit: Payer: BC Managed Care – PPO | Attending: Obstetrics and Gynecology | Admitting: Physical Therapy

## 2020-03-24 ENCOUNTER — Encounter: Payer: Self-pay | Admitting: Physical Therapy

## 2020-03-24 DIAGNOSIS — R252 Cramp and spasm: Secondary | ICD-10-CM | POA: Insufficient documentation

## 2020-03-24 DIAGNOSIS — R279 Unspecified lack of coordination: Secondary | ICD-10-CM | POA: Insufficient documentation

## 2020-03-25 NOTE — Therapy (Signed)
[facility] [address], [place], [place], [address] Phone: [phone]   Fax:  [phone]  Physical Therapy Evaluation  Patient Details  Name: Wendy Avery MRN: [id number] Date of Birth: 11-23-1955 Referring Provider (PT): Janyth Contes, MD   Encounter Date: 03/24/2020   PT End of Session - 03/25/20 1200    Visit Number 1    Date for PT Re-Evaluation 06/16/20    Authorization Type BCBS    PT Start Time 1447    PT Stop Time 1530    PT Time Calculation (min) 43 min    Activity Tolerance Patient tolerated treatment well    Behavior During Therapy [facility] for tasks assessed/performed           Past Medical History:  Diagnosis Date  . Chronic kidney disease   . Heart murmur   . Kidney stones 1999    Past Surgical History:  Procedure Laterality Date  . BACK SURGERY  2010  . DILATION AND CURETTAGE OF UTERUS    . 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    Subjective Assessment - 03/24/20 1451    Subjective Pt states that when she is coughing and sneezing she has a little leakage and wears a pad just in case. Pt states she has had leakage since having children and the last few years has gotten worse.  Pt states she has not exercised as much because covid.    Patient Stated Goals not leaking    Currently in Pain? No/denies              [facility] PT Assessment - 03/25/20 0001      Assessment   Medical Diagnosis N39.3 (ICD-10-CM) - Stress incontinence (female)    Referring Provider (PT) Bovard-Stuckert, Jody, MD    Prior Therapy No      Precautions   Precautions None      Balance Screen   Has the patient fallen in the past 6 months No      [place] residence    Living Arrangements Alone      Prior Function   Level of Independence Independent    Vocation Full time employment     Vocation Requirements sitting at desk job    Leisure gym and walking      Cognition   Overall Cognitive Status Within Functional Limits for tasks assessed      Posture/Postural Control   Posture/Postural Control Postural limitations    Postural Limitations Flexed trunk;Anterior pelvic tilt    Posture Comments trunk rotation to the Rt      AROM   Overall AROM Comments lumbar 50% flexion      PROM   Overall PROM Comments hip rotation 50% bilateral      Flexibility   Soft Tissue Assessment /Muscle Length yes    Hamstrings 80%      Palpation   Palpation comment hamstrings and gluteals and thoracic and lubmar paraspinals tight -Rt>Lt                      Objective measurements completed on examination: See above findings.     Pelvic Floor Special Questions - 03/25/20 0001    Prior Pelvic/Prostate Exam Yes    Are you Pregnant or attempting pregnancy? Yes    Number of Pregnancies 3   +1  miscarriage   Number of Vaginal Deliveries 3    Currently Sexually Active No   widowed   Urinary Leakage Yes    How often not sure    Pad use 1/day usually    Activities that cause leaking With strong urge;Coughing;Sneezing;Laughing    Urinary urgency Yes    Fluid intake 2 bottles    Falling out feeling (prolapse) No    Pelvic Floor Internal Exam ptidentity confimred and internal soft tissue assessed    Exam Type Vaginal    Palpation tight not TTP; bulging and breath holding when attempt to hold contraction    Strength weak squeeze, no lift    Strength # of reps --   3 quick   Strength # of seconds 2    Tone high Rt>Lt                      PT Short Term Goals - 03/25/20 1217      PT SHORT TERM GOAL #1   Title ind with urge drills    Time 4    Period Weeks    Status New    Target Date 04/21/20             PT Long Term Goals - 03/25/20 1201      PT LONG TERM GOAL #1   Title Pt will be ind with HEP for progression of soft tissue elasticity and  strength on her own    Time 12    Period Weeks    Status New    Target Date 06/16/20      PT LONG TERM GOAL #2   Title Pt will report understanding and using toileting techniques and urge drills for reduced urgancy and not having to strain to have a BM    Time 12    Period Weeks    Status New    Target Date 06/16/20                  Plan - 03/25/20 1203    Clinical Impression Statement Pt presents to skilled PT due to urinary leakage that has been worsening over the course of many years.  Pt notices more leakage as the day goes on . She is active and walks as well as doing crafts.  Pt has significant tension throughout back, hamstrings, gluteals, and hip flexors.  Pt has high resting tone of the pelvic floor and diminished muscle contraction.  She can perform 3 quick flicks due to slow timing of contracting and relaxing and can only hold a contraction 2 seconds before bulging and straining. Pt tends to use paraspinals and diaphragm for trunk stability and diminished core engaging.  Pt lives one hour away and does not think she will be able to return for more than 1-2 visits.  Pt will benefit from skiled PT to teach patient how she can address these issues at home and ensure she can correctly contract and relax the pelvic floor.    Personal Factors and Comorbidities Comorbidity 3+    Comorbidities HTN, splenic marginal b-cell, back surgery to remove cyst from L5 2014    Examination-Participation Restrictions Community Activity;Yard Work    Stability/Clinical Decision Making Evolving/Moderate complexity    Clinical Decision Making Moderate    Rehab Potential Excellent    PT Frequency Biweekly    PT Duration 12 weeks    PT Treatment/Interventions ADLs/Self Care Home Management;Biofeedback;Cryotherapy;Electrical Stimulation;Moist Heat;Functional mobility training;Therapeutic activities;Therapeutic exercise;Neuromuscular re-education;Patient/family education;Manual techniques;Passive  range of motion;Dry needling;Taping    PT Next Visit Plan work on breathing and bulging in sitting with TC for toileting; isolated pelvic floor ex, lumbar and hip and h/s stretches    PT Home Exercise Plan gave urge drills    Consulted and Agree with Plan of Care Patient           Patient will benefit from skilled therapeutic intervention in order to improve the following deficits and impairments:  Postural dysfunction,Increased fascial restricitons,Decreased strength,Decreased coordination,Impaired flexibility  Visit Diagnosis: Cramp and spasm  Unspecified lack of coordination     Problem List Patient Active Problem List   Diagnosis Date Noted  . Splenic marginal zone b-cell lymphoma ([place]) 08/24/2016    Jule Ser, PT 03/25/2020, 12:22 PM  [facility] [address], [place], [place], [address] Phone: [phone]   Fax:  [phone]  Name: Robbin Loughmiller MRN: [id number] Date of Birth: 12/20/1955

## 2020-03-30 ENCOUNTER — Encounter: Payer: Self-pay | Admitting: Hematology

## 2020-03-31 ENCOUNTER — Ambulatory Visit: Payer: BC Managed Care – PPO | Admitting: Physical Therapy

## 2020-03-31 ENCOUNTER — Other Ambulatory Visit: Payer: Self-pay

## 2020-03-31 ENCOUNTER — Encounter: Payer: Self-pay | Admitting: Physical Therapy

## 2020-03-31 DIAGNOSIS — R252 Cramp and spasm: Secondary | ICD-10-CM

## 2020-03-31 DIAGNOSIS — R279 Unspecified lack of coordination: Secondary | ICD-10-CM

## 2020-03-31 NOTE — Patient Instructions (Signed)
Access Code: [email or web address] URL: [email or web address] Date: 03/31/2020 Prepared by: Jari Favre  Exercises Hooklying Clamshells with Resistance - 1 x daily - 7 x weekly - 3 sets - 10 reps Bent Knee Fallouts - 1 x daily - 7 x weekly - 3 sets - 10 reps Supine Figure 4 Piriformis Stretch - 1 x daily - 7 x weekly - 1 sets - 3 reps - 30 sec hold Supine Hamstring Stretch with Strap - 1 x daily - 7 x weekly - 1 sets - 3 reps - 30 sec hold Standing Low Back Flexion at Table - 1 x daily - 7 x weekly - 1 sets - 5 reps - 10 hold

## 2020-03-31 NOTE — Therapy (Signed)
[facility] [address] [place], [place], [address] Phone: [phone]   Fax:  [phone]  Physical Therapy Treatment  Patient Details  Name: Wendy Avery MRN: [id number] Date of Birth: 1955-08-30 Referring Provider (PT): Janyth Contes, MD   Encounter Date: 03/31/2020   PT End of Session - 03/31/20 1413    Visit Number 2    Date for PT Re-Evaluation 06/16/20    Authorization Type BCBS    PT Start Time 1401    PT Stop Time 1440    PT Time Calculation (min) 39 min    Activity Tolerance Patient tolerated treatment well    Behavior During Therapy [facility] for tasks assessed/performed           Past Medical History:  Diagnosis Date  . Chronic kidney disease   . Heart murmur   . Kidney stones 1999    Past Surgical History:  Procedure Laterality Date  . BACK SURGERY  2010  . DILATION AND CURETTAGE OF UTERUS    . 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   Subjective Assessment - 03/31/20 1449    Subjective No new updates    Patient Stated Goals not leaking    Currently in Pain? No/denies                             [facility] Adult PT Treatment/Exercise - 03/31/20 0001      Exercises   Exercises Lumbar      Lumbar Exercises: Stretches   Active Hamstring Stretch Right;Left;2 reps;30 seconds    Figure 4 Stretch 2 reps;20 seconds      Lumbar Exercises: Standing   Other Standing Lumbar Exercises modified lean on table kegel with exhale      Lumbar Exercises: Supine   Ab Set 10 reps    AB Set Limitations core pulses    Clam 20 reps    Clam Limitations red    Other Supine Lumbar Exercises bent knee fallouts                  PT Education - 03/31/20 1446    Education Details Access Code: [email or web address]    Person(s) Educated Patient    Methods Explanation;Demonstration;Tactile cues;Verbal cues;Handout     Comprehension Verbalized understanding;Returned demonstration            PT Short Term Goals - 03/25/20 1217      PT SHORT TERM GOAL #1   Title ind with urge drills    Time 4    Period Weeks    Status New    Target Date 04/21/20             PT Long Term Goals - 03/25/20 1201      PT LONG TERM GOAL #1   Title Pt will be ind with HEP for progression of soft tissue elasticity and strength on her own    Time 12    Period Weeks    Status New    Target Date 06/16/20      PT LONG TERM GOAL #2   Title Pt will report understanding and using toileting techniques and urge drills for reduced urgancy and not having to strain to have a BM    Time 12    Period Weeks    Status New  Target Date 06/16/20                 Plan - 03/31/20 1419    Clinical Impression Statement Pt did well with exercises today.  Pt needed cues and education with all exercises as today is the initial follow up since evaluation. Pt will benefit from skilled PT to continue to progress towards functional goals.    PT Treatment/Interventions ADLs/Self Care Home Management;Biofeedback;Cryotherapy;Electrical Stimulation;Moist Heat;Functional mobility training;Therapeutic activities;Therapeutic exercise;Neuromuscular re-education;Patient/family education;Manual techniques;Passive range of motion;Dry needling;Taping    PT Next Visit Plan f/u on HEP, core strength and lumbar and hip stretches progress    PT Home Exercise Plan Access Code: [email or web address]    Consulted and Agree with Plan of Care Patient           Patient will benefit from skilled therapeutic intervention in order to improve the following deficits and impairments:  Postural dysfunction,Increased fascial restricitons,Decreased strength,Decreased coordination,Impaired flexibility  Visit Diagnosis: Cramp and spasm  Unspecified lack of coordination     Problem List Patient Active Problem List   Diagnosis Date Noted  . Splenic marginal  zone b-cell lymphoma ([place]) 08/24/2016    Jule Ser, PT 03/31/2020, 2:49 PM  [facility] [address] [place], [place], [address] Phone: [phone]   Fax:  [phone]  Name: Wendy Avery MRN: [id number] Date of Birth: 06-13-1955

## 2020-04-12 ENCOUNTER — Encounter: Payer: Self-pay | Admitting: Physical Therapy

## 2020-04-12 ENCOUNTER — Ambulatory Visit: Payer: BC Managed Care – PPO | Admitting: Physical Therapy

## 2020-04-12 ENCOUNTER — Other Ambulatory Visit: Payer: Self-pay

## 2020-04-12 DIAGNOSIS — R279 Unspecified lack of coordination: Secondary | ICD-10-CM | POA: Diagnosis not present

## 2020-04-12 DIAGNOSIS — R252 Cramp and spasm: Secondary | ICD-10-CM | POA: Diagnosis not present

## 2020-04-12 NOTE — Therapy (Signed)
[facility] [address], [place], [place], [address] Phone: [phone]   Fax:  [phone]  Physical Therapy Treatment  Patient Details  Name: Wendy Avery MRN: [id number] Date of Birth: 08-Jul-1955 Referring Provider (PT): Janyth Contes, MD   Encounter Date: 04/12/2020   PT End of Session - 04/12/20 0806    Visit Number 3    Date for PT Re-Evaluation 06/16/20    Authorization Type BCBS    PT Start Time 0803    PT Stop Time 0843    PT Time Calculation (min) 40 min    Activity Tolerance Patient tolerated treatment well    Behavior During Therapy [facility] for tasks assessed/performed           Past Medical History:  Diagnosis Date  . Chronic kidney disease   . Heart murmur   . Kidney stones 1999    Past Surgical History:  Procedure Laterality Date  . BACK SURGERY  2010  . DILATION AND CURETTAGE OF UTERUS    . 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   Subjective Assessment - 04/12/20 0805    Subjective I feel a little better.  The exercises didn't get done every day but they made me sore.    Patient Stated Goals not leaking    Currently in Pain? No/denies                             [facility] Adult PT Treatment/Exercise - 04/12/20 0001      Neuro Re-ed    Neuro Re-ed Details  breathing inhale relax and bulge with stretches; exhale on exertion with exercises      Lumbar Exercises: Stretches   Double Knee to Chest Stretch 60 seconds   pelvis elevated on wedge   Other Lumbar Stretch Exercise lembar flexion stretch leaning on table and into bilat hips "wagging tail" and then back with knees straight for stretch into calves and hamstrings    Other Lumbar Stretch Exercise lower trunk rotation      Lumbar Exercises: Standing   Other Standing Lumbar Exercises modified lean on table kegel with exhale       Lumbar Exercises: Supine   Other Supine Lumbar Exercises kegel in hooklying TC and VC for breahting and correctly engaging core and pelvic floor                    PT Short Term Goals - 04/12/20 0847      PT SHORT TERM GOAL #1   Title ind with urge drills    Status On-going             PT Long Term Goals - 04/12/20 0847      PT LONG TERM GOAL #1   Title Pt will be ind with HEP for progression of soft tissue elasticity and strength on her own    Status On-going                 Plan - 04/12/20 0844    Clinical Impression Statement Pt was doing well with intitial HEP.  PT checked with TC to ensure she is engaged with breathing at the same time.  Pt has a lot of tension throughout the posterior chain and reports she has been feeling tight in herRt hip especially.  Overall she  is making progress and reports less leakage. Pt will benefit from skilled PT to progress strength and improved ROM for return to functional activities.    Comorbidities HTN, splenic marginal b-cell, back surgery to remove cyst from L5 2014    PT Treatment/Interventions ADLs/Self Care Home Management;Biofeedback;Cryotherapy;Electrical Stimulation;Moist Heat;Functional mobility training;Therapeutic activities;Therapeutic exercise;Neuromuscular re-education;Patient/family education;Manual techniques;Passive range of motion;Dry needling;Taping    PT Next Visit Plan f/u on HEP, core strength and lumbar and hip stretches progress    PT Home Exercise Plan Access Code: [email or web address]    Consulted and Agree with Plan of Care Patient           Patient will benefit from skilled therapeutic intervention in order to improve the following deficits and impairments:  Postural dysfunction,Increased fascial restricitons,Decreased strength,Decreased coordination,Impaired flexibility  Visit Diagnosis: Cramp and spasm  Unspecified lack of coordination     Problem List Patient Active Problem List   Diagnosis  Date Noted  . 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, [place], [place], [address] Phone: [phone]   Fax:  [phone]  Name: Wendy Avery MRN: [id number] Date of Birth: [birth year]

## 2020-04-19 ENCOUNTER — Ambulatory Visit: Payer: BC Managed Care – PPO | Admitting: Physical Therapy

## 2020-05-03 ENCOUNTER — Encounter: Payer: Self-pay | Admitting: Physical Therapy

## 2020-05-03 ENCOUNTER — Ambulatory Visit: Payer: BC Managed Care – PPO | Attending: Obstetrics and Gynecology | Admitting: Physical Therapy

## 2020-05-03 ENCOUNTER — Other Ambulatory Visit: Payer: Self-pay

## 2020-05-03 DIAGNOSIS — R252 Cramp and spasm: Secondary | ICD-10-CM | POA: Diagnosis not present

## 2020-05-03 DIAGNOSIS — R279 Unspecified lack of coordination: Secondary | ICD-10-CM | POA: Diagnosis not present

## 2020-05-03 NOTE — Therapy (Signed)
[facility] [address], [place], [place], [address] Phone: [phone]   Fax:  [phone]  Physical Therapy Treatment  Patient Details  Name: Wendy Avery MRN: [id number] Date of Birth: 1955-09-08 Referring Provider (PT): Janyth Contes, MD   Encounter Date: 05/03/2020   PT End of Session - 05/03/20 1232    Visit Number 4    Date for PT Re-Evaluation 06/16/20    Authorization Type BCBS    PT Start Time 6962    PT Stop Time 1311    PT Time Calculation (min) 40 min    Activity Tolerance Patient tolerated treatment well    Behavior During Therapy [facility] for tasks assessed/performed           Past Medical History:  Diagnosis Date  . Chronic kidney disease   . Heart murmur   . Kidney stones 1999    Past Surgical History:  Procedure Laterality Date  . BACK SURGERY  2010  . DILATION AND CURETTAGE OF UTERUS    . 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   Subjective Assessment - 05/03/20 1409    Subjective I haven't done the exercises every day but i do notice a little better    Patient Stated Goals not leaking    Currently in Pain? No/denies                             [facility] Adult PT Treatment/Exercise - 05/03/20 0001      Self-Care   Self-Care Other Self-Care Comments    Other Self-Care Comments  self massage with lacrosse ball to the glutes      Neuro Re-ed    Neuro Re-ed Details  exhale with exertion      Lumbar Exercises: Stretches   ITB Stretch Limitations TFL stretch at doorway      Lumbar Exercises: Seated   Sit to Stand 10 reps   kegel     Lumbar Exercises: Supine   Pelvic Tilt 10 reps    Clam 20 reps    Clam Limitations green    Bent Knee Raise 20 reps    Bent Knee Raise Limitations red band    Other Supine Lumbar Exercises bent knee fallouts      Lumbar Exercises: Sidelying   Clam  Right;Left;20 reps                  PT Education - 05/03/20 1433    Education Details Access Code: [email or web address]    Person(s) Educated Patient    Methods Explanation;Demonstration;Tactile cues;Verbal cues;Handout    Comprehension Verbalized understanding;Returned demonstration            PT Short Term Goals - 04/12/20 0847      PT SHORT TERM GOAL #1   Title ind with urge drills    Status On-going             PT Long Term Goals - 04/12/20 0847      PT LONG TERM GOAL #1   Title Pt will be ind with HEP for progression of soft tissue elasticity and strength on her own    Status On-going                 Plan - 05/03/20 1510    Clinical Impression Statement Pt has made progress and  reports noticing less leakage and urgency.  She had some questions about some of the exercises.  Pt needed cues to activate gluteals correct and she tends to stay tight and activate TFL much more than glute med. Pt was able to increase resistance with exercises and able to activate the TrAbdominus better with tactile cues.  Pt will benefit from skilled PT to address impairments and continue to progress towards functional goals.    PT Treatment/Interventions ADLs/Self Care Home Management;Biofeedback;Cryotherapy;Electrical Stimulation;Moist Heat;Functional mobility training;Therapeutic activities;Therapeutic exercise;Neuromuscular re-education;Patient/family education;Manual techniques;Passive range of motion;Dry needling;Taping    PT Next Visit Plan core strength, hip extension, glute strength, standing staggared stance, half kneel, pallof ex's    PT Home Exercise Plan Access Code: [email or web address]    Consulted and Agree with Plan of Care Patient           Patient will benefit from skilled therapeutic intervention in order to improve the following deficits and impairments:  Postural dysfunction,Increased fascial restricitons,Decreased strength,Decreased coordination,Impaired flexibility  Visit  Diagnosis: Cramp and spasm  Unspecified lack of coordination     Problem List Patient Active Problem List   Diagnosis Date Noted  . 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 [place], [place], [address] Phone: [phone]   Fax:  [phone]  Name: Wendy Avery MRN: [id number] Date of Birth: [birth year]

## 2020-05-03 NOTE — Patient Instructions (Signed)
Access Code: [email or web address] URL: [email or web address] Date: 05/03/2020 Prepared by: Jari Favre  Exercises Hooklying Clamshells with Resistance - 1 x daily - 7 x weekly - 3 sets - 10 reps Bent Knee Fallouts - 1 x daily - 7 x weekly - 3 sets - 10 reps Supine Figure 4 Piriformis Stretch - 1 x daily - 7 x weekly - 1 sets - 3 reps - 30 sec hold Supine Hamstring Stretch with Strap - 1 x daily - 7 x weekly - 1 sets - 3 reps - 30 sec hold Standing Low Back Flexion at Table - 1 x daily - 7 x weekly - 1 sets - 5 reps - 10 hold Lower Trunk Rotations - 1 x daily - 7 x weekly - 3 sets - 10 reps Clamshell with Resistance - 1 x daily - 7 x weekly - 3 sets - 10 reps Standing ITB Stretch - 1 x daily - 7 x weekly - 3 reps - 1 sets - 30 sec hold

## 2020-05-10 ENCOUNTER — Ambulatory Visit: Payer: BC Managed Care – PPO | Admitting: Physical Therapy

## 2020-05-17 ENCOUNTER — Other Ambulatory Visit: Payer: Self-pay

## 2020-05-17 ENCOUNTER — Ambulatory Visit: Payer: BC Managed Care – PPO | Admitting: Physical Therapy

## 2020-05-17 ENCOUNTER — Encounter: Payer: Self-pay | Admitting: Physical Therapy

## 2020-05-17 DIAGNOSIS — R252 Cramp and spasm: Secondary | ICD-10-CM | POA: Diagnosis not present

## 2020-05-17 DIAGNOSIS — R279 Unspecified lack of coordination: Secondary | ICD-10-CM | POA: Diagnosis not present

## 2020-05-17 NOTE — Patient Instructions (Signed)
Access Code: [email or web address] URL: [email or web address] Date: 05/17/2020 Prepared by: Jari Favre  Exercises Hooklying Clamshells with Resistance - 1 x daily - 7 x weekly - 3 sets - 10 reps Bent Knee Fallouts - 1 x daily - 7 x weekly - 3 sets - 10 reps Supine Figure 4 Piriformis Stretch - 1 x daily - 7 x weekly - 1 sets - 3 reps - 30 sec hold Supine Hamstring Stretch with Strap - 1 x daily - 7 x weekly - 1 sets - 3 reps - 30 sec hold Standing Low Back Flexion at Table - 1 x daily - 7 x weekly - 1 sets - 5 reps - 10 hold Lower Trunk Rotations - 1 x daily - 7 x weekly - 3 sets - 10 reps Clamshell with Resistance - 1 x daily - 7 x weekly - 3 sets - 10 reps Standing ITB Stretch - 1 x daily - 7 x weekly - 3 reps - 1 sets - 30 sec hold Supine Butterfly Groin Stretch - 1 x daily - 7 x weekly - 1 sets - 3 reps - 30 sec hold

## 2020-05-17 NOTE — Therapy (Addendum)
[facility] [address], [place], [place], [address] Phone: [phone]   Fax:  [phone]  Physical Therapy Treatment  Patient Details  Name: Wendy Avery MRN: [id number] Date of Birth: [birth year] Referring Provider (PT): Janyth Contes, MD   Encounter Date: 05/17/2020   PT End of Session - 05/17/20 1412    Visit Number 5    Date for PT Re-Evaluation 06/16/20    Authorization Type BCBS    PT Start Time 7062   late   PT Stop Time 1458    PT Time Calculation (min) 51 min    Activity Tolerance Patient tolerated treatment well    Behavior During Therapy [facility] for tasks assessed/performed           Past Medical History:  Diagnosis Date  . Chronic kidney disease   . Heart murmur   . Kidney stones 1999    Past Surgical History:  Procedure Laterality Date  . BACK SURGERY  2010  . DILATION AND CURETTAGE OF UTERUS    . 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   Subjective Assessment - 05/17/20 1415    Subjective I feel really bloated and my groin hurts.  My body overall feels very uncomfortable.  I have been having less leakage and nocturia.    Patient Stated Goals not leaking    Currently in Pain? Yes    Pain Score --   had difficulty quantifying   Pain Location Groin    Pain Orientation Right    Pain Descriptors / Indicators Tightness    Pain Type Acute pain    Pain Onset In the past 7 days    Pain Frequency Intermittent    Aggravating Factors  gardening, exercises?, lying on the Rt side caused numbness    Multiple Pain Sites No              [facility] PT Assessment - 05/17/20 0001      Special Tests   Other special tests FABER and Scour Rt hip - unclear had muscle spasms                      Pelvic Floor Special Questions - 05/17/20 0001    Pelvic Floor Internal Exam ptidentity confimred and internal  soft tissue assessed             [facility] Adult PT Treatment/Exercise - 05/17/20 0001      Self-Care   Other Self-Care Comments  educated in breaking up activities into shorter time intervals due to pt reporting pain, tightness and muscle spasms when she was gardening all day for 2 days over the weekend      Lumbar Exercises: Stretches   Active Hamstring Stretch Right;Left;2 reps;30 seconds    Other Lumbar Stretch Exercise TFL/IT band stretch reviewed - 30 sec each side    Other Lumbar Stretch Exercise butterfly      Manual Therapy   Manual Therapy Soft tissue mobilization    Soft tissue mobilization adductors Rt side                  PT Education - 05/17/20 1510    Education Details Access Code: [email or web address]    Person(s) Educated Patient    Methods Explanation;Demonstration;Tactile cues;Verbal cues;Handout    Comprehension Verbalized understanding;Returned demonstration  PT Short Term Goals - 05/17/20 1514      PT SHORT TERM GOAL #1   Title ind with urge drills    Status Achieved             PT Long Term Goals - 05/17/20 1514      PT LONG TERM GOAL #1   Title Pt will be ind with HEP for progression of soft tissue elasticity and strength on her own    Status On-going      PT LONG TERM GOAL #2   Title Pt will report understanding and using toileting techniques and urge drills for reduced urgancy and not having to strain to have a BM    Status On-going                 Plan - 05/17/20 1511    Clinical Impression Statement Pt presented today with increased pain in the groin on the Rt side and reports cramping in the Rt hip.  PT performed scour and FABER tests with increased pain due to muscle spasms in the Rt gluteals per patient.  Inconclusive of osteoarthritic pain.  Pt had 80 deg hip flexion with Passive SLR test and just feeling a stretch.  Today's session focused on STM to adductors which were very tight on Rt side.  Released and added  butterfly stretch to HEP.  Session also spent time reviewing the HEP and removed sidelying clam for now. Reviewed all exercises and stretched and educated on breaking up activities into shorter chunks of time so she would have less issues from sweating and muscle spasms.  Pt will benefit from skilled PT to continue to address core strength.  She is reporting less leakage.    PT Treatment/Interventions ADLs/Self Care Home Management;Biofeedback;Cryotherapy;Electrical Stimulation;Moist Heat;Functional mobility training;Therapeutic activities;Therapeutic exercise;Neuromuscular re-education;Patient/family education;Manual techniques;Passive range of motion;Dry needling;Taping    PT Next Visit Plan core strength, hip extension, glute strength, standing staggared stance, half kneel, pallof ex's    PT Home Exercise Plan Access Code: [email or web address]    Consulted and Agree with Plan of Care Patient           Patient will benefit from skilled therapeutic intervention in order to improve the following deficits and impairments:  Postural dysfunction,Increased fascial restricitons,Decreased strength,Decreased coordination,Impaired flexibility  Visit Diagnosis: Cramp and spasm  Unspecified lack of coordination     Problem List Patient Active Problem List   Diagnosis Date Noted  . 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 [place], [place], [address] Phone: [phone]   Fax:  [phone]  Name: Wendy Avery MRN: [id number] Date of Birth: 08/16/1955  PHYSICAL THERAPY DISCHARGE SUMMARY  Visits from Start of Care: 5  Current functional level related to goals / functional outcomes: See above goals   Remaining deficits: See above   Education / Equipment: HEP  Plan: Patient agrees to discharge.  Patient goals were not met. Patient is being discharged  due to not returning since the last visit.  ?????    Canceled due to financial reasons Gustavus Bryant, PT 06/13/20 4:03 PM

## 2020-05-19 ENCOUNTER — Other Ambulatory Visit: Payer: Self-pay

## 2020-05-19 ENCOUNTER — Ambulatory Visit (HOSPITAL_COMMUNITY)
Admission: RE | Admit: 2020-05-19 | Discharge: 2020-05-19 | Disposition: A | Discharge status: Home / Self Care | Payer: BC Managed Care – PPO | Source: Ambulatory Visit | Attending: Hematology | Admitting: Hematology

## 2020-05-19 DIAGNOSIS — R161 Splenomegaly, not elsewhere classified: Secondary | ICD-10-CM | POA: Diagnosis not present

## 2020-05-19 DIAGNOSIS — C8307 Small cell B-cell lymphoma, spleen: Secondary | ICD-10-CM | POA: Diagnosis not present

## 2020-05-26 ENCOUNTER — Encounter: Payer: BC Managed Care – PPO | Admitting: Physical Therapy

## 2020-05-27 ENCOUNTER — Telehealth: Payer: Self-pay

## 2020-05-27 NOTE — Telephone Encounter (Signed)
Returned call to pt regarding getting 2nd Covid booster. Told pt Dr Irene Limbo was encouraging his pts to get the second booster. Pt has appointment for booster this pm.

## 2020-05-30 DIAGNOSIS — C8580 Other specified types of non-Hodgkin lymphoma, unspecified site: Secondary | ICD-10-CM | POA: Diagnosis not present

## 2020-05-30 DIAGNOSIS — U071 COVID-19: Secondary | ICD-10-CM | POA: Diagnosis not present

## 2020-05-31 ENCOUNTER — Telehealth: Payer: Self-pay | Admitting: Hematology

## 2020-05-31 NOTE — Telephone Encounter (Signed)
R/s appts per 5/10 sch msg. Pt aware.  

## 2020-06-01 ENCOUNTER — Encounter: Payer: Self-pay | Admitting: Hematology

## 2020-06-02 ENCOUNTER — Ambulatory Visit: Payer: BC Managed Care – PPO | Admitting: Hematology

## 2020-06-02 ENCOUNTER — Other Ambulatory Visit: Payer: BC Managed Care – PPO

## 2020-06-16 ENCOUNTER — Ambulatory Visit: Payer: BC Managed Care – PPO | Admitting: Physical Therapy

## 2020-06-20 NOTE — Progress Notes (Signed)
[name]    HEMATOLOGY/ONCOLOGY CLINIC NOTE  Date of Service: 06/21/20    Patient Care Team: [name], MD as PCP - General (Family Medicine)  PCP: [name] MD   CHIEF COMPLAINTS/PURPOSE OF CONSULTATION:  F/u for splenic marginal zone lymphoma  HISTORY OF PRESENTING ILLNESS:   Wendy Avery is a wonderful 65 y.o. female who has been referred to [place] by Dr .[name], MD  for evaluation and management of possible lymphoma in the setting of massive splenomegaly and abdominal lymphadenopathy.  Patient patient has no significant chronic medical issues. She presented to urgent care with abdominal bloating and distention for 1-2 months. Also had some abdominal discomfort in the left upper quadrant of the abdomen and notes that she could feel a hard mass. She also notes some aches in her chest for 1-2 months and significant progressive fatigue which is starting to affect her quality of life. Over the last month she also notes some drenching night sweats but isn't sure if this is related to menopausal symptoms. Denies any overt weight loss. Patient has history of anxiety and notes severe anxiety over the symptoms.  Patient had a CT of the abdomen and pelvis with contrast on 08/03/2016 to further evaluate her symptoms. This showed massive splenomegaly with the spleen measuring 23.0 x 20.2 x 11.6 cm (volume = 2820 cm^3). Also noted to have concurrent  mild abdominal adenopathy. Incidental findings of Indeterminate left renal lesion. Most likely a minimally complex cyst. A solid neoplasm cannot be excluded.  Patient  notes that she wants to figure out the diagnosis ASAP and is very anxious. Reports no history of alcohol abuse or liver disease.  She has them out of colonoscopy but has had fecal occult blood testing recently which was negative. Last mammogram April 2017 with the normal limits. Last Pap smear August 2017 was within normal limits.   INTERVAL HISTORY  Wendy Avery is here  for her scheduled follow-up for her SMZL. The patient's last visit with [place] was on 02/11/2020. The pt reports that she is doing well overall.  The pt reports that she has been very stressed lately regarding the results of her ultrasound and spleen size. The pt notes that she has recently started experiencing night sweats and some discomfort from her spleen area. The pt notes she had COVID around the same time she had her booster around May 6. This was prior to her [place]. She went on Paxlovid on May 9 and this helped to resolve all her symptoms.  Of note since the patient's last visit, pt has had [place] Abd Complete ([id number]) on 05/19/2020, which revealed "1. Massive splenomegaly with calculated volume of approximately 1635 mL (previously measured approximately 811 mL on 11/30/2016). 2. The echogenicity of the liver is mildly increased. This is a nonspecific finding but is most commonly seen with fatty infiltration of the liver. There are no obvious focal liver lesions."  Lab results today 06/21/2020 of CBC w/diff and CMP is as follows: all values are WNL except for [facility] of 25.9, Plt of 106K, Glucose of 106, Total protein of 6.2. 06/21/2020 LDH pending.  On review of systems, pt reports fatigue, night sweats, stress, spleen discomfort and denies new lumps/bumps, abdominal pain, back pain, and any other symptoms.  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 Marital status: Widowed    Spouse name: Not on file  . Number of children: Not on file  . Years of education: Not on file  . Highest education level: Not on file  Occupational History  . Not on file  Tobacco Use  . Smoking status: Former  Smoker    Quit date: 2010    Years since quitting: 12.4  . Smokeless tobacco: Never Used  Vaping Use  . Vaping Use: Never used  Substance and Sexual Activity  . Alcohol use: Yes    Comment: occassional   . Drug use: No  . Sexual activity: Not on file  Other Topics Concern  . Not on file  Social History Narrative  . Not on file   Social Determinants of Health   Financial Resource Strain: Not on file  Food Insecurity: Not on file  Transportation Needs: Not on file  Physical Activity: Not on file  Stress: Not on file  Social Connections: Not on file  Intimate Partner Violence: Not on file  Ex-smoker 1 pack per day quit 30 years ago Uses alcohol socially She is widowed and has 2 daughters   FAMILY HISTORY: Mom had breast cancer at age 20 years and died from metastatic disease Father skin cancer, vocal cord cancer, alcohol abuse  ALLERGIES:  ?PCN  MEDICATIONS:  No current outpatient medications on file.   No current facility-administered medications for this visit.    REVIEW OF SYSTEMS:   10 Point review of Systems was done is negative except as noted above.  PHYSICAL EXAMINATION: ECOG FS:1 - Symptomatic but completely ambulatory  Vitals:   06/21/20 0855  BP: (!) 91/57  Pulse: 62  Resp: 17  Temp: (!) 97.5 F (36.4 C)  SpO2: 99%   Wt Readings from Last 3 Encounters:  06/21/20 217 lb (98.4 kg)  02/11/20 220 lb 11.2 oz (100.1 kg)  09/09/19 212 lb 1.6 oz (96.2 kg)   Body mass index is 41 kg/m.     GENERAL:alert, in no acute distress and comfortable SKIN: no acute rashes, no significant lesions EYES: conjunctiva are pink and non-injected, sclera anicteric OROPHARYNX: MMM, no exudates, no oropharyngeal erythema or ulceration NECK: supple, no JVD LYMPH:  no palpable lymphadenopathy in the cervical, axillary or inguinal regions LUNGS: clear to auscultation b/l with normal respiratory effort HEART: regular rate & rhythm ABDOMEN:  normoactive bowel sounds ,  non tender, not distended. Spleen 2-3 finger breadths above the costal margin. Extremity: no pedal edema PSYCH: alert & oriented x 3 with fluent speech NEURO: no focal motor/sensory deficits    LABORATORY DATA:  I have reviewed the data as listed  . CBC Latest Ref Rng & Units 06/21/2020 02/11/2020 09/09/2019  WBC 4.0 - 10.5 K/uL 6.3 5.8 5.6  Hemoglobin 12.0 - 15.0 g/dL 12.8 12.9 13.4  Hematocrit 36.0 - 46.0 % 40.0 41.0 41.0  Platelets 150 - 400 K/uL 106(L) 107(L) 118(L)   CBC    Component Value Date/Time   WBC 6.3 06/21/2020 0835   RBC 4.94 06/21/2020 0835   HGB 12.8 06/21/2020 0835   HGB 14.4 06/13/2017 0950   HGB 15.0 01/03/2017 1138   HCT 40.0 06/21/2020 0835   HCT 44.4 01/03/2017 1138   PLT 106 (L) 06/21/2020 0835   PLT 130 (L) 06/13/2017 0950   PLT 169 01/03/2017 1138   MCV 81.0 06/21/2020 0835   MCV 86.9  01/03/2017 1138   MCH 25.9 (L) 06/21/2020 0835   MCHC 32.0 06/21/2020 0835   RDW 14.4 06/21/2020 0835   RDW 12.9 01/03/2017 1138   LYMPHSABS 3.2 06/21/2020 0835   LYMPHSABS 1.8 01/03/2017 1138   MONOABS 0.4 06/21/2020 0835   MONOABS 0.4 01/03/2017 1138   EOSABS 0.1 06/21/2020 0835   EOSABS 0.2 01/03/2017 1138   BASOSABS 0.0 06/21/2020 0835   BASOSABS 0.0 01/03/2017 1138    . CMP Latest Ref Rng & Units 06/21/2020 02/11/2020 09/09/2019  Glucose 70 - 99 mg/dL 106(H) 107(H) 106(H)  BUN 8 - 23 mg/dL _0 Creatinine 0.44 - 1.00 mg/dL 0.76 0.79 0.77  Sodium 135 - 145 mmol/L 143 140 138  Potassium 3.5 - 5.1 mmol/L 4.2 3.8 3.9  Chloride 98 - 111 mmol/L 109 107 105  CO2 22 - 32 mmol/L _1 Calcium 8.9 - 10.3 mg/dL 9.1 8.9 9.7  Total Protein 6.5 - 8.1 g/dL 6.2(L) 6.4(L) 6.3(L)  Total Bilirubin 0.3 - 1.2 mg/dL 0.8 0.6 0.6  Alkaline Phos 38 - 126 U/L 92 94 77  AST 15 - 41 U/L _2 ALT 0 - 44 U/L _3 Lab Results  Component Value Date   LDH 171 02/11/2020    ...   Component     Latest Ref Rng & Units 09/07/2016  Hep B Core Ab, Tot      Negative Negative  Hepatitis B Surface Ag     Negative Negative   Component     Latest Ref Rng & Units 08/10/2016  Sed Rate     0 - 40 mm/hr 12  LDH     125 - 245 U/L 178  EBV VCA IgM     0.0 - 35.9 U/mL <36.0  CMV IgM Ser EIA-aCnc     0.0 - 29.9 AU/mL <30.0  HIV Screen 4th Generation wRfx     Non Reactive Non Reactive  Hep C Virus Ab     0.0 - 0.9 s/co ratio <0.1     RADIOGRAPHIC STUDIES: ABDOMEN ULTRASOUND COMPLETE  COMPARISON:  CT 08/03/2016  FINDINGS: Gallbladder: No gallstones or wall thickening visualized. No sonographic Murphy sign noted by sonographer.  Common bile duct: Diameter: 1.8 mm.  Liver: No focal lesion identified. Within normal limits in parenchymal echogenicity. Portal vein is patent on color Doppler imaging with normal direction of blood flow towards the liver.  IVC: No abnormality visualized.  Pancreas: Visualized portion unremarkable.  Spleen: Mild to moderate splenomegaly as the spleen measures 12.9 cm in greatest diameter, although splenic volume is increased measuring 785 cubic cm.  Right Kidney: Length: 10.1 cm. Echogenicity within normal limits. No mass or hydronephrosis visualized.  Left Kidney: Length: 10.8 cm. Echogenicity within normal limits. No mass or hydronephrosis visualized.  Abdominal aorta: No aneurysm visualized.  Other findings: None.  IMPRESSION: No acute findings.  Known mild to moderate splenomegaly with splenic volume 785 cubic cm.   Electronically Signed   By: Marin Olp M.D.   On: 11/30/2016 10:09   ASSESSMENT & PLAN:   65 y.o. 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 Diffusely hypermetabolic on PET/CT No evidence of liver disease on CT scan. No evidence of portal hypertension. MPN w/u demonstrated no evidence of  genetics mutations to suggest MPN  [place] abd 11/30/2016 - spleen now down to 12.9 cm post Rituxan treatment  #2 Mild thrombocytopenia platelets of 107k. Likely due to the Cylinder and from hypersplenism in the setting of massive splenomegaly. Platelets had normalized completely to 152K.  plt today at 130k - slightly lower in the setting of recent URI  #3 Indetermine left renal lesion 1.5 cms in size ?complex cyst - will need monitoring. No reports of this on [place] ab in 11/2016.  #5 Severe anxiety- improved since initial treatment.   PLAN: -Discussed pt labwork today, 06/21/2020; Plt lower and gradually decreasing, no lymphocytosis. Chemistries stable, LDH pending. -Discussed pt [place] Abd Complete ([id number]) on 05/19/2020; spleen enlarged in size. Increased from prior US.  -Advised pt that she does have some element of fatty liver.  -Advised pt that we are trying to observe if the spleen is enlarged due to lymphoma or due to liver changes causing it. -Discussed options to be more certain. We can do flow cytometry of the blood, PET scan to observe spleen activity, or bone marrow biopsy if flow cytometry inconclusive. -Advised pt we cannot biopsy the spleen. -Advised pt that we can assume this is SMZL if the pt does not desire to undergo all the tests. The pt wishes to get the labwork and PET scan. -Discussed treatment of doing Rituxan again and potential for maintenance after. -Advised pt we would need to wait 4-6 weeks prior starting Rituxan from her recent COVID infection. -Discussed Evusheld and pt's eligibility. Will send referral. -Discussed continuing staying active, drinking water, and living healthy lifestyle.  -Will get labs in 1-2 weeks. -Will get PET in 1-2 weeks.  -Will get BM Bx in 1-2 weeks. -Will tentatively set up for starting treatment in the first week of July. -Advised pt the risk rupture of the spleen is much greater with fast-growing lymphomas. -Will see back in 5  weeks.   FOLLOW UP: PET/CT in 2 weeks CT bone marrow aspiration and biopsy in 2 weeks Referral for Evusheld Return to clinic with Dr. Irene Limbo in 5 to 6 weeks Schedule to start weekly Rituxan x4 in 5 to 6 weeks with labs and MD visit at the time    The total time spent in the appointment was 30 minutes and more than 50% was on counseling and direct patient cares, ordering workup and rx   All of the patient's questions were answered with apparent satisfaction. The patient knows to call the clinic with any problems, questions or concerns.   Sullivan Lone MD [place] AAHIVMS [facility] Hematology/Oncology Physician [facility]  (Office):       [phone] (Work cell):  [phone] (Fax):           [phone]  I, Reinaldo Raddle, am acting as scribe for Dr. Sullivan Lone, MD.   .I have reviewed the above documentation for accuracy and completeness, and I agree with the above. Brunetta Genera MD

## 2020-06-21 ENCOUNTER — Encounter: Payer: Self-pay | Admitting: Hematology

## 2020-06-21 ENCOUNTER — Inpatient Hospital Stay: Payer: BC Managed Care – PPO | Admitting: Hematology

## 2020-06-21 ENCOUNTER — Other Ambulatory Visit: Payer: Self-pay

## 2020-06-21 ENCOUNTER — Inpatient Hospital Stay: Payer: BC Managed Care – PPO | Attending: Hematology

## 2020-06-21 VITALS — BP 91/57 | HR 62 | Temp 97.5°F | Resp 17 | Wt 217.0 lb

## 2020-06-21 DIAGNOSIS — N289 Disorder of kidney and ureter, unspecified: Secondary | ICD-10-CM | POA: Diagnosis not present

## 2020-06-21 DIAGNOSIS — C8307 Small cell B-cell lymphoma, spleen: Secondary | ICD-10-CM | POA: Diagnosis not present

## 2020-06-21 DIAGNOSIS — Z7189 Other specified counseling: Secondary | ICD-10-CM

## 2020-06-21 DIAGNOSIS — Z87891 Personal history of nicotine dependence: Secondary | ICD-10-CM | POA: Insufficient documentation

## 2020-06-21 DIAGNOSIS — F419 Anxiety disorder, unspecified: Secondary | ICD-10-CM | POA: Diagnosis not present

## 2020-06-21 LAB — CMP (CANCER CENTER ONLY)
ALT: 25 U/L (ref 0–44)
AST: 22 U/L (ref 15–41)
Albumin: 3.8 g/dL (ref 3.5–5.0)
Alkaline Phosphatase: 92 U/L (ref 38–126)
Anion gap: 9 (ref 5–15)
BUN: 20 mg/dL (ref 8–23)
CO2: 25 mmol/L (ref 22–32)
Calcium: 9.1 mg/dL (ref 8.9–10.3)
Chloride: 109 mmol/L (ref 98–111)
Creatinine: 0.76 mg/dL (ref 0.44–1.00)
GFR, Estimated: >60 mL/min (ref >60)
Glucose, Bld: 106 mg/dL — ABNORMAL HIGH (ref 70–99)
Potassium: 4.2 mmol/L (ref 3.5–5.1)
Sodium: 143 mmol/L (ref 135–145)
Total Bilirubin: 0.8 mg/dL (ref 0.3–1.2)
Total Protein: 6.2 g/dL — ABNORMAL LOW (ref 6.5–8.1)

## 2020-06-21 LAB — CBC WITH DIFFERENTIAL/PLATELET
Abs Immature Granulocytes: 0.02 10*3/uL (ref 0.00–0.07)
Basophils Absolute: 0 10*3/uL (ref 0.0–0.1)
Basophils Relative: 1 %
Eosinophils Absolute: 0.1 10*3/uL (ref 0.0–0.5)
Eosinophils Relative: 2 %
HCT: 40 % (ref 36.0–46.0)
Hemoglobin: 12.8 g/dL (ref 12.0–15.0)
Immature Granulocytes: 0 %
Lymphocytes Relative: 50 %
Lymphs Abs: 3.2 10*3/uL (ref 0.7–4.0)
MCH: 25.9 pg — ABNORMAL LOW (ref 26.0–34.0)
MCHC: 32 g/dL (ref 30.0–36.0)
MCV: 81 fL (ref 80.0–100.0)
Monocytes Absolute: 0.4 10*3/uL (ref 0.1–1.0)
Monocytes Relative: 7 %
Neutro Abs: 2.5 10*3/uL (ref 1.7–7.7)
Neutrophils Relative %: 40 %
Platelets: 106 10*3/uL — ABNORMAL LOW (ref 150–400)
RBC: 4.94 MIL/uL (ref 3.87–5.11)
RDW: 14.4 % (ref 11.5–15.5)
WBC: 6.3 10*3/uL (ref 4.0–10.5)
nRBC: 0 % (ref 0.0–0.2)

## 2020-06-21 LAB — LACTATE DEHYDROGENASE: LDH: 179 U/L (ref 98–192)

## 2020-06-22 ENCOUNTER — Other Ambulatory Visit: Payer: Self-pay | Admitting: Hematology

## 2020-06-23 ENCOUNTER — Encounter: Payer: Self-pay | Admitting: Hematology

## 2020-06-23 DIAGNOSIS — Z7189 Other specified counseling: Secondary | ICD-10-CM | POA: Insufficient documentation

## 2020-06-24 ENCOUNTER — Telehealth: Payer: Self-pay | Admitting: Hematology

## 2020-06-24 NOTE — Telephone Encounter (Signed)
Scheduled follow-up appointments per 5/31 los. Patient is aware. 

## 2020-06-27 ENCOUNTER — Telehealth: Payer: Self-pay | Admitting: Adult Health

## 2020-06-27 NOTE — Telephone Encounter (Signed)
Called patient and discussed Evusheld.  She would like to review information about the injection and will call me back tomorrow if she would like to proceed.    Wilber Bihari, NP

## 2020-06-29 ENCOUNTER — Other Ambulatory Visit: Payer: Self-pay | Admitting: Adult Health

## 2020-06-29 DIAGNOSIS — C8307 Small cell B-cell lymphoma, spleen: Secondary | ICD-10-CM

## 2020-06-29 NOTE — Progress Notes (Signed)
I connected by phone with Paul Half on 06/29/2020, 4:29 PM to discuss the potential use of a new treatment, tixagevimab/cilgavimab, for pre-exposure prophylaxis for prevention of coronavirus disease 2019 (COVID-19) caused by the SARS-CoV-2 virus.  This patient is a 65 y.o. female that meets the FDA criteria for Emergency Use Authorization of tixagevimab/cilgavimab for pre-exposure prophylaxis of COVID-19 disease. Pt meets following criteria:  Age >12 yr and weight > 40kg  Not currently infected with SARS-CoV-2 and has no known recent exposure to an individual infected with SARS-CoV-2 AND o Who has moderate to severe immune compromise due to a medical condition or receipt of immunosuppressive medications or treatments and may not mount an adequate immune response to COVID-19 vaccination or  o Vaccination with any available COVID-19 vaccine, according to the approved or authorized schedule, is not recommended due to a history of severe adverse reaction (e.g., severe allergic reaction) to a COVID-19 vaccine(s) and/or COVID-19 vaccine component(s).  o Patient meets the following definition of mod-severe immune compromised status: 2. Received B-cell depleting therapies within the last 6 months and age < 51yrs  I have spoken and communicated the following to the patient or parent/caregiver regarding COVID monoclonal antibody treatment:  1. FDA has authorized the emergency use of tixagevimab/cilgavimab for the pre-exposure prophylaxis of COVID-19 in patients with moderate-severe immunocompromised status, who meet above EUA criteria.  2. The significant known and potential risks and benefits of COVID monoclonal antibody, and the extent to which such potential risks and benefits are unknown.  3. Information on available alternative treatments and the risks and benefits of those alternatives, including clinical trials.  4. The patient or parent/caregiver has the option to accept or refuse COVID monoclonal  antibody treatment.  After reviewing this information with the patient, agree to receive tixagevimab/cilgavimab.  Patient will receive this on 7/7 when she comes in for her Rituximab.   Scot Dock, NP, 06/29/2020, 4:29 PM

## 2020-07-04 ENCOUNTER — Other Ambulatory Visit: Payer: Self-pay | Admitting: Radiology

## 2020-07-04 ENCOUNTER — Other Ambulatory Visit: Payer: Self-pay | Admitting: Student

## 2020-07-04 DIAGNOSIS — C8307 Small cell B-cell lymphoma, spleen: Secondary | ICD-10-CM | POA: Diagnosis not present

## 2020-07-04 DIAGNOSIS — D696 Thrombocytopenia, unspecified: Secondary | ICD-10-CM | POA: Diagnosis not present

## 2020-07-04 DIAGNOSIS — C8599 Non-Hodgkin lymphoma, unspecified, extranodal and solid organ sites: Secondary | ICD-10-CM | POA: Diagnosis not present

## 2020-07-04 NOTE — H&P (Signed)
Chief Complaint: Patient was seen in consultation today for image guided bone marrow biopsy and aspiration at the request of Brunetta Genera  Referring Physician(s): Brunetta Genera  Supervising Physician: Jacqulynn Cadet  Patient Status: [facility] - Out-pt  History of Present Illness: Wendy Avery is a 65 y.o. female with PMH of heart murmur, nephrolithiasis, and CKD who was referred to IR for image guided bone marrow biopsy and aspiration.   Patient has been followed by hematology and oncology since July 2018 due to splenomegaly and abdominal lymphadenopathy seen on CAT scan, spleen was measured 23 x 20.2 x 11.6 cm with volume of 2820 cm.  Patient underwent image guided bone marrow biopsy and aspiration with IR on 08/13/2016 and PET/CT on 08/15/2016, and was diagnosed with splenic marginal zone lymphoma. Patient underwent and  completed the chemotherapy, and follow-up ultrasound abdomen on 11/30/2016 showed spleen decreased in size, measuring 12.9 cm in greatest diameter and splenic volume of 785 cm.  Patient had a follow-up visit with Dr. Irene Limbo on 02/11/2020 when she presented with worsening of chronic thrombocytopenia.  Ultrasound abdomen on 05/20/2020 showed spleen measuring 12.1 x 8.5 x 18.3 cm with calculated volume of 1635 mL.  After thorough discussion and shared decision making with her oncology team, patient decided to proceed with bone marrow biopsy and aspiration for further evaluation of recurrent splenomegaly.  IR was requested for image guided bone marrow biopsy and aspiration.  Patient laying in bed, not in acute distress.  Denise headache, fever, chills, shortness of breath, cough, chest pain, abdominal pain, nausea ,vomiting, and bleeding.    Past Medical History:  Diagnosis Date   Chronic kidney disease    Heart murmur    Kidney stones 1999    Past Surgical History:  Procedure Laterality Date   BACK SURGERY  2010   DILATION AND CURETTAGE OF UTERUS      SKIN CANCER EXCISION     basal cell skin cancer removed from shoulder    Allergies: Penicillins  Medications: Prior to Admission medications   Not on File     History reviewed. No pertinent family history.  Social History   Socioeconomic History   Marital status: Widowed    Spouse name: Not on file   Number of children: Not on file   Years of education: Not on file   Highest education level: Not on file  Occupational History   Not on file  Tobacco Use   Smoking status: Former    Pack years: 0.00    Types: Cigarettes    Quit date: 2010    Years since quitting: 12.4   Smokeless tobacco: Never  Vaping Use   Vaping Use: Never used  Substance and Sexual Activity   Alcohol use: Yes    Comment: occassional    Drug use: No   Sexual activity: Not on file  Other Topics Concern   Not on file  Social History Narrative   Not on file   Social Determinants of Health   Financial Resource Strain: Not on file  Food Insecurity: Not on file  Transportation Needs: Not on file  Physical Activity: Not on file  Stress: Not on file  Social Connections: Not on file     Review of Systems: A 12 point ROS discussed and pertinent positives are indicated in the HPI above.  All other systems are negative.   Vital Signs: BP (!) 118/58   Pulse (!) 55   Temp 98.2 F (36.8 C) (Oral)   Resp  16   SpO2 98%   Physical Exam  Vitals and nursing note reviewed.  Constitutional:      General: He is not in acute distress.    Appearance: Normal appearance.  HENT:     Head: Normocephalic and atraumatic.     Mouth/Throat:     Mouth: Mucous membranes are moist.     Pharynx: Oropharynx is clear.  Cardiovascular:     Rate and Rhythm: Normal rate and regular rhythm.     Pulses: Normal pulses.     Heart sounds: Normal heart sounds.  Pulmonary:     Effort: Pulmonary effort is normal.     Breath sounds: Normal breath sounds. No wheezing, rhonchi or rales.  Abdominal:     General: Bowel  sounds are normal. There is no distension.     Palpations: Abdomen is soft.  Skin:    General: Skin is warm and dry.  Neurological:     Mental Status: He is alert and oriented to person, place, and time.  Psychiatric:        Mood and Affect: Mood normal.        Behavior: Behavior normal.    MD Evaluation Airway: WNL Heart: WNL Abdomen: WNL Chest/ Lungs: WNL ASA  Classification: 1 Mallampati/Airway Score: Three  Imaging: No results found.  Labs:  CBC: Recent Labs    09/09/19 1505 02/11/20 1445 06/21/20 0835  WBC 5.6 5.8 6.3  HGB 13.4 12.9 12.8  HCT 41.0 41.0 40.0  PLT 118* 107* 106*    COAGS: No results for input(s): INR, APTT in the last 8760 hours.  BMP: Recent Labs    09/09/19 1505 02/11/20 1445 06/21/20 0835  NA 138 140 143  K 3.9 3.8 4.2  CL 105 107 109  CO2 26 25 25   GLUCOSE 106* 107* 106*  BUN 17 20 20   CALCIUM 9.7 8.9 9.1  CREATININE 0.77 0.79 0.76  GFRNONAA >60 >60 >60  GFRAA >60  --   --     LIVER FUNCTION TESTS: Recent Labs    09/09/19 1505 02/11/20 1445 06/21/20 0835  BILITOT 0.6 0.6 0.8  AST 16 22 22   ALT 18 27 25   ALKPHOS 77 94 92  PROT 6.3* 6.4* 6.2*  ALBUMIN 3.8 3.9 3.8    TUMOR MARKERS: No results for input(s): AFPTM, CEA, CA199, CHROMGRNA in the last 8760 hours.  Assessment and Plan: 65 y.o. female with history of SMCL originally diagnosed in July 2018 s/p chemotherapy who was referred to IR for bone marrow biopsy and aspiration due to recurrent splenomegaly seen in recent ultrasound on 05/20/2020.   IR was requested for image guided bone marrow aspiration biopsy. Patient presents to [employer] today or the procedure. N.p.o. since midnight VSS CBC pending   Risks and benefits of bone marrow biopsy and aspiration was discussed with the patient and/or patient's family including, but not limited to bleeding, infection, damage to adjacent structures or low yield requiring additional tests.  All of the questions were  answered and there is agreement to proceed.  Consent signed and in chart.    Thank you for this interesting consult.  I greatly enjoyed meeting Wendy Avery and look forward to participating in their care.  A copy of this report was sent to the requesting provider on this date.  Electronically Signed: Tera Mater, PA-C 07/05/2020, 8:47 AM   I spent a total of    25 Minutes in face to face in clinical consultation, greater than  50% of which was counseling/coordinating care for bone marrow biopsy and aspiration.

## 2020-07-05 ENCOUNTER — Ambulatory Visit (HOSPITAL_COMMUNITY)
Admission: RE | Admit: 2020-07-05 | Discharge: 2020-07-05 | Disposition: A | Discharge status: Home / Self Care | Payer: BC Managed Care – PPO | Source: Ambulatory Visit | Attending: Hematology | Admitting: Hematology

## 2020-07-05 ENCOUNTER — Encounter (HOSPITAL_COMMUNITY): Payer: Self-pay

## 2020-07-05 ENCOUNTER — Other Ambulatory Visit: Payer: Self-pay

## 2020-07-05 DIAGNOSIS — Z87891 Personal history of nicotine dependence: Secondary | ICD-10-CM | POA: Insufficient documentation

## 2020-07-05 DIAGNOSIS — D696 Thrombocytopenia, unspecified: Secondary | ICD-10-CM | POA: Diagnosis not present

## 2020-07-05 DIAGNOSIS — C8599 Non-Hodgkin lymphoma, unspecified, extranodal and solid organ sites: Secondary | ICD-10-CM | POA: Diagnosis not present

## 2020-07-05 DIAGNOSIS — C8307 Small cell B-cell lymphoma, spleen: Secondary | ICD-10-CM | POA: Insufficient documentation

## 2020-07-05 LAB — CBC WITH DIFFERENTIAL/PLATELET
Abs Immature Granulocytes: 0.02 10*3/uL (ref 0.00–0.07)
Basophils Absolute: 0 10*3/uL (ref 0.0–0.1)
Basophils Relative: 1 %
Eosinophils Absolute: 0.1 10*3/uL (ref 0.0–0.5)
Eosinophils Relative: 2 %
HCT: 41.6 % (ref 36.0–46.0)
Hemoglobin: 13.2 g/dL (ref 12.0–15.0)
Immature Granulocytes: 0 %
Lymphocytes Relative: 53 %
Lymphs Abs: 3.1 10*3/uL (ref 0.7–4.0)
MCH: 26.1 pg (ref 26.0–34.0)
MCHC: 31.7 g/dL (ref 30.0–36.0)
MCV: 82.4 fL (ref 80.0–100.0)
Monocytes Absolute: 0.3 10*3/uL (ref 0.1–1.0)
Monocytes Relative: 5 %
Neutro Abs: 2.3 10*3/uL (ref 1.7–7.7)
Neutrophils Relative %: 39 %
Platelets: 104 10*3/uL — ABNORMAL LOW (ref 150–400)
RBC: 5.05 MIL/uL (ref 3.87–5.11)
RDW: 14.6 % (ref 11.5–15.5)
WBC: 5.8 10*3/uL (ref 4.0–10.5)
nRBC: 0 % (ref 0.0–0.2)

## 2020-07-05 LAB — BASIC METABOLIC PANEL
Anion gap: 7 (ref 5–15)
BUN: 20 mg/dL (ref 8–23)
CO2: 28 mmol/L (ref 22–32)
Calcium: 9.2 mg/dL (ref 8.9–10.3)
Chloride: 105 mmol/L (ref 98–111)
Creatinine, Ser: 0.82 mg/dL (ref 0.44–1.00)
GFR, Estimated: >60 mL/min (ref >60)
Glucose, Bld: 99 mg/dL (ref 70–99)
Potassium: 4 mmol/L (ref 3.5–5.1)
Sodium: 140 mmol/L (ref 135–145)

## 2020-07-05 LAB — PROTIME-INR
INR: 1 (ref 0.8–1.2)
Prothrombin Time: 13.5 seconds (ref 11.4–15.2)

## 2020-07-05 MED ORDER — FENTANYL CITRATE (PF) 100 MCG/2ML IJ SOLN
INTRAMUSCULAR | Status: AC | PRN
  Administered 2020-07-05 (×2): 50 ug via INTRAVENOUS

## 2020-07-05 MED ORDER — FENTANYL CITRATE (PF) 100 MCG/2ML IJ SOLN
INTRAMUSCULAR | Status: AC
  Filled 2020-07-05: qty 2

## 2020-07-05 MED ORDER — SODIUM CHLORIDE 0.9 % IV SOLN: INTRAVENOUS | Status: DC

## 2020-07-05 MED ORDER — LIDOCAINE HCL 1 % IJ SOLN
INTRAMUSCULAR | Status: AC | PRN
  Administered 2020-07-05: 10 mL via INTRADERMAL

## 2020-07-05 MED ORDER — MIDAZOLAM HCL 2 MG/2ML IJ SOLN
INTRAMUSCULAR | Status: AC
  Filled 2020-07-05: qty 2

## 2020-07-05 NOTE — Discharge Instructions (Addendum)
You may remove the bandaid after 24 hours.  You do not have to place another bandage over the puncture site.    Bone Marrow Aspiration and Bone Marrow Biopsy, Adult, Care After This sheet gives you information about how to care for yourself after your procedure. Your health care provider may also give you more specific instructions. If you have problems or questions, contact your health careprovider. What can I expect after the procedure? After the procedure, it is common to have: Mild pain and tenderness. Swelling. Bruising. Follow these instructions at home: Puncture site care  Follow instructions from your health care provider about how to take care of the puncture site. Make sure you: Wash your hands with soap and water before and after you change your bandage (dressing). If soap and water are not available, use hand sanitizer. Change your dressing as told by your health care provider. Check your puncture site every day for signs of infection. Check for: More redness, swelling, or pain. Fluid or blood. Warmth. Pus or a bad smell.   Activity Return to your normal activities as told by your health care provider. Ask your health care provider what activities are safe for you. Do not lift anything that is heavier than 10 lb (4.5 kg), or the limit that you are told, until your health care provider says that it is safe. General instructions  Take over-the-counter and prescription medicines only as told by your health care provider. Do not take baths, swim, or use a hot tub until the puncture site has healed. If directed, put ice on the affected area. To do this: Put ice in a plastic bag. Place a towel between your skin and the bag. Leave the ice on for 20 minutes, 2-3 times a day. Keep all follow-up visits as told by your health care provider. This is important.   Contact a health care provider if: Your pain is not controlled with medicine. You have a fever. You have more redness,  swelling, or pain around the puncture site. You have fluid or blood coming from the puncture site. Your puncture site feels warm to the touch. You have pus or a bad smell coming from the puncture site. Summary After the procedure, it is common to have mild pain, tenderness, swelling, and bruising. Follow instructions from your health care provider about how to take care of the puncture site and what activities are safe for you. Take over-the-counter and prescription medicines only as told by your health care provider. Contact a health care provider if you have any signs of infection, such as fluid or blood coming from the puncture site. This information is not intended to replace advice given to you by your health care provider. Make sure you discuss any questions you have with your healthcare provider. Document Revised: 05/27/2018 Document Reviewed: 05/27/2018 Elsevier Patient Education  [place].

## 2020-07-05 NOTE — Procedures (Signed)
Interventional Radiology Procedure Note  Procedure: CT guided aspirate and core biopsy of right iliac bone  Complications: None  EBL: None  Recommendations: - Bedrest supine x 1 hrs - Hydrocodone PRN  Pain - Follow biopsy results  Signed,  Criselda Peaches, MD

## 2020-07-05 NOTE — Progress Notes (Signed)
Patient did not have sedation today.  Patient received fentanyl and local numbing agent for her bone marrow biopsy.   [facility] transportation called and set up for patient. Ride will be here by 11:15 at the main entrance.

## 2020-07-06 LAB — SURGICAL PATHOLOGY

## 2020-07-07 ENCOUNTER — Other Ambulatory Visit: Payer: Self-pay

## 2020-07-07 ENCOUNTER — Ambulatory Visit (HOSPITAL_COMMUNITY)
Admission: RE | Admit: 2020-07-07 | Discharge: 2020-07-07 | Disposition: A | Discharge status: Home / Self Care | Payer: BC Managed Care – PPO | Source: Ambulatory Visit | Attending: Hematology | Admitting: Hematology

## 2020-07-07 ENCOUNTER — Other Ambulatory Visit (HOSPITAL_COMMUNITY): Payer: Self-pay | Admitting: Diagnostic Radiology

## 2020-07-07 DIAGNOSIS — C8307 Small cell B-cell lymphoma, spleen: Secondary | ICD-10-CM | POA: Diagnosis not present

## 2020-07-07 DIAGNOSIS — C884 Extranodal marginal zone B-cell lymphoma of mucosa-associated lymphoid tissue [MALT-lymphoma]: Secondary | ICD-10-CM | POA: Diagnosis not present

## 2020-07-07 LAB — GLUCOSE, CAPILLARY: Glucose-Capillary: 112 mg/dL — ABNORMAL HIGH (ref 70–99)

## 2020-07-07 MED ORDER — FLUDEOXYGLUCOSE F - 18 (FDG) INJECTION
11.0000 | Freq: Once | INTRAVENOUS | Status: AC | PRN
  Administered 2020-07-07: 10.83 via INTRAVENOUS

## 2020-07-13 ENCOUNTER — Encounter (HOSPITAL_COMMUNITY): Payer: Self-pay | Admitting: Hematology

## 2020-07-14 LAB — SURGICAL PATHOLOGY

## 2020-07-20 NOTE — Progress Notes (Signed)
Pharmacist Chemotherapy Monitoring - Initial Assessment    Anticipated start date: 07/28/20 1  The following has been reviewed per standard work regarding the patient's treatment regimen: The patient's diagnosis, treatment plan and drug doses, and organ/hematologic function Lab orders and baseline tests specific to treatment regimen  The treatment plan start date, drug sequencing, and pre-medications Prior authorization status  Patient's documented medication list, including drug-drug interaction screen and prescriptions for anti-emetics and supportive care specific to the treatment regimen The drug concentrations, fluid compatibility, administration routes, and timing of the medications to be used The patient's access for treatment and lifetime cumulative dose history, if applicable  The patient's medication allergies and previous infusion related reactions, if applicable   Changes made to treatment plan:  N/A  Follow up needed:  Pending authorization for treatment  Note:  pt received rituximab in 2018 so hep panel completed at that time.  Philomena Course, [place], 07/20/2020  1:57 PM

## 2020-07-22 ENCOUNTER — Encounter: Payer: Self-pay | Admitting: Hematology

## 2020-07-27 NOTE — Progress Notes (Signed)
[name]    HEMATOLOGY/ONCOLOGY CLINIC NOTE  Date of Service: 07/28/20    Patient Care Team: [name], MD as PCP - General (Family Medicine)  PCP: [name] MD   CHIEF COMPLAINTS/PURPOSE OF CONSULTATION:  F/u for splenic marginal zone lymphoma  HISTORY OF PRESENTING ILLNESS:   Wendy Avery is a wonderful 65 y.o. female who has been referred to [place] by Dr .[name], MD  for evaluation and management of possible lymphoma in the setting of massive splenomegaly and abdominal lymphadenopathy.  Patient patient has no significant chronic medical issues. She presented to urgent care with abdominal bloating and distention for 1-2 months. Also had some abdominal discomfort in the left upper quadrant of the abdomen and notes that she could feel a hard mass. She also notes some aches in her chest for 1-2 months and significant progressive fatigue which is starting to affect her quality of life. Over the last month she also notes some drenching night sweats but isn't sure if this is related to menopausal symptoms. Denies any overt weight loss. Patient has history of anxiety and notes severe anxiety over the symptoms.  Patient had a CT of the abdomen and pelvis with contrast on 08/03/2016 to further evaluate her symptoms. This showed massive splenomegaly with the spleen measuring 23.0 x 20.2 x 11.6 cm (volume = 2820 cm^3). Also noted to have concurrent  mild abdominal adenopathy. Incidental findings of Indeterminate left renal lesion. Most likely a minimally complex cyst. A solid neoplasm cannot be excluded.  Patient  notes that she wants to figure out the diagnosis ASAP and is very anxious. Reports no history of alcohol abuse or liver disease.  She has them out of colonoscopy but has had fecal occult blood testing recently which was negative. Last mammogram April 2017 with the normal limits. Last Pap smear August 2017 was within normal limits.   INTERVAL HISTORY  Wendy Avery is here  for her scheduled follow-up for her SMZL. The patient's last visit with [place] was on 06/21/2020. The pt reports that she is doing well overall. She is currently on C1D1 Rituxan.  The pt reports that she has been having a lot of sweats and feeling flushed that is affecting her sleep. She notes no issues with the bone marrow biopsy. They did not sedate her but gave her a painkiller due to having an [place] home and not family member. The patient has been very ready for starting treatment today.  Of note since the patient's last visit, pt has had CT Bone Marrow Biopsy and Aspiration on 07/05/2020.   The pt has also had NM PET Skull Base to Thigh on 07/07/2020, which revealed "1. Splenomegaly is again noted, improved from previous exam. Mild diffuse FDG uptake is identified, improved from previous exam. The SUV max within the liver is now equal to that of the background liver activity. 2. Resolution of previous FDG avid mild abdominal retroperitoneal adenopathy. 3. No new sites of disease."  Lab results today 07/28/2020 of CBC w/diff and CMP is as follows: all values are WNL except for Plt of 99K, Glucose of 141, Total protein of 6.1. 07/28/2020 LDH of 160.  On review of systems, pt reports intermittent sweating, anxiety,difficulty sleeping and denies abdominal pain, back pain, leg swelling, and any other symptoms.   MEDICAL HISTORY:   1) Severe -Anxiety 2) IBS - constipation/diarrhea/dyspepsia 3) history of nephrolithiasis  SURGICAL HISTORY: #1 D&C for menorrhagia related to uterine polyps in 2007 #2 back surgery 2014 for L5  nerve cyst #3 right shoulder excision of basal cell carcinoma in 2005   SOCIAL HISTORY: Social History   Socioeconomic History   Marital status: Widowed    Spouse name: Not on file   Number of children: Not on file   Years of education: Not on file   Highest education level: Not on file  Occupational History   Not on file  Tobacco Use   Smoking status: Former     Pack years: 0.00    Types: Cigarettes    Quit date: 2010    Years since quitting: 12.5   Smokeless tobacco: Never  Vaping Use   Vaping Use: Never used  Substance and Sexual Activity   Alcohol use: Yes    Comment: occassional    Drug use: No   Sexual activity: Not on file  Other Topics Concern   Not on file  Social History Narrative   Not on file   Social Determinants of Health   Financial Resource Strain: Not on file  Food Insecurity: Not on file  Transportation Needs: Not on file  Physical Activity: Not on file  Stress: Not on file  Social Connections: Not on file  Intimate Partner Violence: Not on file  Ex-smoker 1 pack per day quit 30 years ago Uses alcohol socially She is widowed and has 2 daughters   FAMILY HISTORY: Mom had breast cancer at age 82 years and died from metastatic disease Father skin cancer, vocal cord cancer, alcohol abuse  ALLERGIES:  ?PCN  MEDICATIONS:  Current Outpatient Medications  Medication Sig Dispense Refill   LORazepam (ATIVAN) 0.5 MG tablet Take 1 tablet (0.5 mg total) by mouth every 8 (eight) hours as needed for anxiety. 30 tablet 0   No current facility-administered medications for this visit.   Facility-Administered Medications Ordered in Other Visits  Medication Dose Route Frequency Provider Last Rate Last Admin   methylPREDNISolone sodium succinate (SOLU-MEDROL) 125 mg/2 mL injection 125 mg  125 mg Intravenous Daily Brunetta Genera, MD   125 mg at 07/28/20 7017   riTUXimab-pvvr (RUXIENCE) 800 mg in sodium chloride 0.9 % 250 mL (2.4242 mg/mL) infusion  375 mg/m2 (Treatment Plan Recorded) Intravenous Once Brunetta Genera, MD        REVIEW OF SYSTEMS:   10 Point review of Systems was done is negative except as noted above.  PHYSICAL EXAMINATION: ECOG FS:1 - Symptomatic but completely ambulatory  There were no vitals filed for this visit.  Wt Readings from Last 3 Encounters:  06/21/20 217 lb (98.4 kg)  02/11/20 220  lb 11.2 oz (100.1 kg)  09/09/19 212 lb 1.6 oz (96.2 kg)   There is no height or weight on file to calculate BMI.    Exam was given in infusion.   GENERAL:alert, in no acute distress and comfortable SKIN: no acute rashes, no significant lesions EYES: conjunctiva are pink and non-injected, sclera anicteric OROPHARYNX: MMM, no exudates, no oropharyngeal erythema or ulceration NECK: supple, no JVD LYMPH:  no palpable lymphadenopathy in the cervical, axillary or inguinal regions LUNGS: clear to auscultation b/l with normal respiratory effort HEART: regular rate & rhythm ABDOMEN:  normoactive bowel sounds , non tender, not distended. Spleen 2-3 finger breadths above the costal margin. Extremity: no pedal edema PSYCH: alert & oriented x 3 with fluent speech NEURO: no focal motor/sensory deficits  LABORATORY DATA:  I have reviewed the data as listed  . CBC Latest Ref Rng & Units 07/28/2020 07/05/2020 06/21/2020  WBC 4.0 - 10.5  K/uL 4.9 5.8 6.3  Hemoglobin 12.0 - 15.0 g/dL 12.2 13.2 12.8  Hematocrit 36.0 - 46.0 % 37.9 41.6 40.0  Platelets 150 - 400 K/uL 99(L) 104(L) 106(L)   CBC    Component Value Date/Time   WBC 4.9 07/28/2020 0858   WBC 5.8 07/05/2020 0900   RBC 4.64 07/28/2020 0858   HGB 12.2 07/28/2020 0858   HGB 15.0 01/03/2017 1138   HCT 37.9 07/28/2020 0858   HCT 44.4 01/03/2017 1138   PLT 99 (L) 07/28/2020 0858   PLT 169 01/03/2017 1138   MCV 81.7 07/28/2020 0858   MCV 86.9 01/03/2017 1138   MCH 26.3 07/28/2020 0858   MCHC 32.2 07/28/2020 0858   RDW 14.4 07/28/2020 0858   RDW 12.9 01/03/2017 1138   LYMPHSABS 2.2 07/28/2020 0858   LYMPHSABS 1.8 01/03/2017 1138   MONOABS 0.5 07/28/2020 0858   MONOABS 0.4 01/03/2017 1138   EOSABS 0.1 07/28/2020 0858   EOSABS 0.2 01/03/2017 1138   BASOSABS 0.0 07/28/2020 0858   BASOSABS 0.0 01/03/2017 1138    . CMP Latest Ref Rng & Units 07/28/2020 07/05/2020 06/21/2020  Glucose 70 - 99 mg/dL 141(H) 99 106(H)  BUN 8 - 23 mg/dL _0 Creatinine 0.44 - 1.00 mg/dL 0.75 0.82 0.76  Sodium 135 - 145 mmol/L 141 140 143  Potassium 3.5 - 5.1 mmol/L 4.0 4.0 4.2  Chloride 98 - 111 mmol/L 108 105 109  CO2 22 - 32 mmol/L _1 Calcium 8.9 - 10.3 mg/dL 8.9 9.2 9.1  Total Protein 6.5 - 8.1 g/dL 6.1(L) - 6.2(L)  Total Bilirubin 0.3 - 1.2 mg/dL 0.6 - 0.8  Alkaline Phos 38 - 126 U/L 99 - 92  AST 15 - 41 U/L 17 - 22  ALT 0 - 44 U/L 20 - 25    Lab Results  Component Value Date   LDH 160 07/28/2020    ...   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   PERIPHERAL BLOOD:  - Abnormal lymphocytes present.  - Mild thrombocytopenia.   COMMENT:   The marrow is normocellular but exhibits a few small abnormal lymphoid  aggregates. Flow cytometry reveals a monoclonal B-cell population with a  similar phenotype (no CD5 or CD10 expression) to that of the patient's  prior lymphoma. The overall findings are consistent with mild  involvement by the patient's prior non-Hodgkin B-cell lymphoma (splenic  marginal zone lymphoma). While there is no absolute lymphocytosis, there  are abnormal lymphocytes which likely represent peripheral blood  involvement.   07/05/2020 Cytogenetics Report    RADIOGRAPHIC STUDIES: ABDOMEN ULTRASOUND COMPLETE   COMPARISON:  CT 08/03/2016   FINDINGS: Gallbladder: No gallstones  or wall thickening visualized. No sonographic Murphy sign noted by sonographer.   Common  bile duct: Diameter: 1.8 mm.   Liver: No focal lesion identified. Within normal limits in parenchymal echogenicity. Portal vein is patent on color Doppler imaging with normal direction of blood flow towards the liver.   IVC: No abnormality visualized.   Pancreas: Visualized portion unremarkable.   Spleen: Mild to moderate splenomegaly as the spleen measures 12.9 cm in greatest diameter, although splenic volume is increased measuring 785 cubic cm.   Right Kidney: Length: 10.1 cm. Echogenicity within normal limits. No mass or hydronephrosis visualized.   Left Kidney: Length: 10.8 cm. Echogenicity within normal limits. No mass or hydronephrosis visualized.   Abdominal aorta: No aneurysm visualized.   Other findings: None.   IMPRESSION: No acute findings.   Known mild to moderate splenomegaly with splenic volume 785 cubic cm.     Electronically Signed   By: Marin Olp M.D.   On: 11/30/2016 10:09   ASSESSMENT & PLAN:   65 y.o. 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 Diffusely hypermetabolic on PET/CT No evidence of liver disease on CT scan. No evidence of portal hypertension. MPN w/u demonstrated no evidence of genetics mutations to suggest MPN  [place] abd 11/30/2016 - spleen now down to 12.9 cm post Rituxan treatment  #2 Mild thrombocytopenia platelets of 107k. 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 Platelets had normalized completely to 152K.  plt today at 130k - slightly lower in the setting of recent URI  #3 Indetermine left renal lesion 1.5 cms in size ?complex cyst - will need monitoring. No reports of this on [place] ab in 11/2016.  #5 Severe anxiety- improved since initial treatment.   PLAN: -Discussed pt labwork today, 07/28/2020; LDH normal, chemistries  normal. Counts stable. -Discussed pt CT Bone Marrow Biopsy and Aspiration on 07/05/2020.; showed recurrence of SMZL. -Discussed pt NM PET Skull Base to Thigh on 07/07/2020; significant splenomegaly related to her SMZL. -Discussed continuing staying active, drinking water, and living healthy lifestyle.  -Recommended yogurt with probiotics to reduce risk of yeast infections. Can take chewable probiotic as well. -Avoid contact sports. Resistance exercise and the gym should be okay.  -Recommended pt avoid raw meats. -Will Rx Ativan for use prn.  -Will see back in 1 week with labs and C2D1.   FOLLOW UP: MD visit 7/14 with C2 of Rituxan of Rituxan for toxicity check MD visit with C4 of Rituxan Labs with each Rituxan treatment   The total time spent in the appointment was 30 minutes and more than 50% was on counseling and direct patient cares.   All of the patient's questions were answered with apparent satisfaction. The patient knows to call the clinic with any problems, questions or concerns.   Sullivan Lone MD [place] AAHIVMS [facility] Hematology/Oncology Physician [facility]  (Office):       [phone] (Work cell):  [phone] (Fax):           [phone]  I, Reinaldo Raddle, am acting as scribe for Dr. Sullivan Lone, MD.   .I have reviewed the above documentation for accuracy and completeness, and I agree with the above. Brunetta Genera MD

## 2020-07-28 ENCOUNTER — Other Ambulatory Visit: Payer: Self-pay

## 2020-07-28 ENCOUNTER — Inpatient Hospital Stay: Payer: BC Managed Care – PPO | Attending: Hematology

## 2020-07-28 ENCOUNTER — Inpatient Hospital Stay (HOSPITAL_BASED_OUTPATIENT_CLINIC_OR_DEPARTMENT_OTHER): Payer: BC Managed Care – PPO | Admitting: Hematology

## 2020-07-28 ENCOUNTER — Inpatient Hospital Stay: Payer: BC Managed Care – PPO

## 2020-07-28 VITALS — BP 136/55 | HR 70 | Temp 98.5°F | Resp 17

## 2020-07-28 DIAGNOSIS — D696 Thrombocytopenia, unspecified: Secondary | ICD-10-CM | POA: Diagnosis not present

## 2020-07-28 DIAGNOSIS — Z7189 Other specified counseling: Secondary | ICD-10-CM

## 2020-07-28 DIAGNOSIS — C8307 Small cell B-cell lymphoma, spleen: Secondary | ICD-10-CM

## 2020-07-28 DIAGNOSIS — Z79899 Other long term (current) drug therapy: Secondary | ICD-10-CM | POA: Diagnosis not present

## 2020-07-28 DIAGNOSIS — Z5112 Encounter for antineoplastic immunotherapy: Secondary | ICD-10-CM | POA: Insufficient documentation

## 2020-07-28 LAB — CBC WITH DIFFERENTIAL (CANCER CENTER ONLY)
Abs Immature Granulocytes: 0.01 10*3/uL (ref 0.00–0.07)
Basophils Absolute: 0 10*3/uL (ref 0.0–0.1)
Basophils Relative: 0 %
Eosinophils Absolute: 0.1 10*3/uL (ref 0.0–0.5)
Eosinophils Relative: 2 %
HCT: 37.9 % (ref 36.0–46.0)
Hemoglobin: 12.2 g/dL (ref 12.0–15.0)
Immature Granulocytes: 0 %
Lymphocytes Relative: 45 %
Lymphs Abs: 2.2 10*3/uL (ref 0.7–4.0)
MCH: 26.3 pg (ref 26.0–34.0)
MCHC: 32.2 g/dL (ref 30.0–36.0)
MCV: 81.7 fL (ref 80.0–100.0)
Monocytes Absolute: 0.5 10*3/uL (ref 0.1–1.0)
Monocytes Relative: 9 %
Neutro Abs: 2.2 10*3/uL (ref 1.7–7.7)
Neutrophils Relative %: 44 %
Platelet Count: 99 10*3/uL — ABNORMAL LOW (ref 150–400)
RBC: 4.64 MIL/uL (ref 3.87–5.11)
RDW: 14.4 % (ref 11.5–15.5)
WBC Count: 4.9 10*3/uL (ref 4.0–10.5)
nRBC: 0 % (ref 0.0–0.2)

## 2020-07-28 LAB — CMP (CANCER CENTER ONLY)
ALT: 20 U/L (ref 0–44)
AST: 17 U/L (ref 15–41)
Albumin: 3.6 g/dL (ref 3.5–5.0)
Alkaline Phosphatase: 99 U/L (ref 38–126)
Anion gap: 9 (ref 5–15)
BUN: 18 mg/dL (ref 8–23)
CO2: 24 mmol/L (ref 22–32)
Calcium: 8.9 mg/dL (ref 8.9–10.3)
Chloride: 108 mmol/L (ref 98–111)
Creatinine: 0.75 mg/dL (ref 0.44–1.00)
GFR, Estimated: >60 mL/min (ref >60)
Glucose, Bld: 141 mg/dL — ABNORMAL HIGH (ref 70–99)
Potassium: 4 mmol/L (ref 3.5–5.1)
Sodium: 141 mmol/L (ref 135–145)
Total Bilirubin: 0.6 mg/dL (ref 0.3–1.2)
Total Protein: 6.1 g/dL — ABNORMAL LOW (ref 6.5–8.1)

## 2020-07-28 LAB — LACTATE DEHYDROGENASE: LDH: 160 U/L (ref 98–192)

## 2020-07-28 MED ORDER — TIXAGEVIMAB (PART OF EVUSHELD) INJECTION
300.0000 mg | Freq: Once | INTRAMUSCULAR | Status: AC
  Administered 2020-07-28: 300 mg via INTRAMUSCULAR
  Filled 2020-07-28: qty 3

## 2020-07-28 MED ORDER — CILGAVIMAB (PART OF EVUSHELD) INJECTION
300.0000 mg | Freq: Once | INTRAMUSCULAR | Status: AC
Start: 2020-07-28 — End: 2020-07-28
  Administered 2020-07-28: 300 mg via INTRAMUSCULAR
  Filled 2020-07-28: qty 3

## 2020-07-28 MED ORDER — DIPHENHYDRAMINE HCL 25 MG PO CAPS
ORAL_CAPSULE | ORAL | Status: AC
  Filled 2020-07-28: qty 2

## 2020-07-28 MED ORDER — FAMOTIDINE 20 MG IN NS 100 ML IVPB
INTRAVENOUS | Status: AC
  Filled 2020-07-28: qty 100

## 2020-07-28 MED ORDER — DIPHENHYDRAMINE HCL 25 MG PO CAPS
ORAL_CAPSULE | ORAL | Status: AC
  Filled 2020-07-28: qty 1

## 2020-07-28 MED ORDER — LORAZEPAM 0.5 MG PO TABS: 0.5000 mg | ORAL_TABLET | Freq: Three times a day (TID) | ORAL | 0 refills | Status: AC | PRN

## 2020-07-28 MED ORDER — DIPHENHYDRAMINE HCL 25 MG PO CAPS
50.0000 mg | ORAL_CAPSULE | Freq: Once | ORAL | Status: AC
  Administered 2020-07-28: 50 mg via ORAL

## 2020-07-28 MED ORDER — ACETAMINOPHEN 325 MG PO TABS
ORAL_TABLET | ORAL | Status: AC
  Filled 2020-07-28: qty 2

## 2020-07-28 MED ORDER — METHYLPREDNISOLONE SODIUM SUCC 125 MG IJ SOLR
125.0000 mg | Freq: Every day | INTRAMUSCULAR | Status: DC
  Administered 2020-07-28: 125 mg via INTRAVENOUS

## 2020-07-28 MED ORDER — SODIUM CHLORIDE 0.9 % IV SOLN
375.0000 mg/m2 | Freq: Once | INTRAVENOUS | Status: AC
  Administered 2020-07-28: 800 mg via INTRAVENOUS
  Filled 2020-07-28: qty 30

## 2020-07-28 MED ORDER — LORAZEPAM 1 MG PO TABS
ORAL_TABLET | ORAL | Status: AC
  Filled 2020-07-28: qty 1

## 2020-07-28 MED ORDER — FAMOTIDINE 20 MG IN NS 100 ML IVPB
20.0000 mg | Freq: Once | INTRAVENOUS | Status: AC
  Administered 2020-07-28: 20 mg via INTRAVENOUS

## 2020-07-28 MED ORDER — ACETAMINOPHEN 325 MG PO TABS
650.0000 mg | ORAL_TABLET | Freq: Once | ORAL | Status: AC
  Administered 2020-07-28: 650 mg via ORAL

## 2020-07-28 MED ORDER — METHYLPREDNISOLONE SODIUM SUCC 125 MG IJ SOLR
INTRAMUSCULAR | Status: AC
  Filled 2020-07-28: qty 2

## 2020-07-28 MED ORDER — LORAZEPAM 1 MG PO TABS
0.5000 mg | ORAL_TABLET | Freq: Once | ORAL | Status: AC
  Administered 2020-07-28: 0.5 mg via ORAL

## 2020-07-28 MED ORDER — SODIUM CHLORIDE 0.9 % IV SOLN
Freq: Once | INTRAVENOUS | Status: AC
  Filled 2020-07-28: qty 250

## 2020-07-28 NOTE — Patient Instructions (Signed)
[facility]   Discharge Instructions: Thank you for choosing [facility] to provide your oncology and hematology care.   If you have a lab appointment with the [facility], please go directly to the [facility] and check in at the registration area.   Wear comfortable clothing and clothing appropriate for easy access to any Portacath or PICC line.   We strive to give you quality time with your provider. You may need to reschedule your appointment if you arrive late (15 or more minutes).  Arriving late affects you and other patients whose appointments are after yours.  Also, if you miss three or more appointments without notifying the office, you may be dismissed from the clinic at the provider's discretion.      For prescription refill requests, have your pharmacy contact our office and allow 72 hours for refills to be completed.    Today you received the following chemotherapy and/or immunotherapy agents: rituximab.      To help prevent nausea and vomiting after your treatment, we encourage you to take your nausea medication as directed.  BELOW ARE SYMPTOMS THAT SHOULD BE REPORTED IMMEDIATELY: *FEVER GREATER THAN 100.4 F (38 C) OR HIGHER *CHILLS OR SWEATING *NAUSEA AND VOMITING THAT IS NOT CONTROLLED WITH YOUR NAUSEA MEDICATION *UNUSUAL SHORTNESS OF BREATH *UNUSUAL BRUISING OR BLEEDING *URINARY PROBLEMS (pain or burning when urinating, or frequent urination) *BOWEL PROBLEMS (unusual diarrhea, constipation, pain near the anus) TENDERNESS IN MOUTH AND THROAT WITH OR WITHOUT PRESENCE OF ULCERS (sore throat, sores in mouth, or a toothache) UNUSUAL RASH, SWELLING OR PAIN  UNUSUAL VAGINAL DISCHARGE OR ITCHING   Items with * indicate a potential emergency and should be followed up as soon as possible or go to the Emergency Department if any problems should occur.  Please show the CHEMOTHERAPY ALERT CARD or IMMUNOTHERAPY ALERT CARD at check-in  to the Emergency Department and triage nurse.  Should you have questions after your visit or need to cancel or reschedule your appointment, please contact [facility]  Dept: [phone]  and follow the prompts.  Office hours are 8:00 a.m. to 4:30 p.m. Monday - Friday. Please note that voicemails left after 4:00 p.m. may not be returned until the following business day.  We are closed weekends and major holidays. You have access to a nurse at all times for urgent questions. Please call the main number to the clinic Dept: [phone] and follow the prompts.   For any non-urgent questions, you may also contact your provider using MyChart. We now offer e-Visits for anyone 23 and older to request care online for non-urgent symptoms. For details visit [email or web address].   Also download the MyChart app! Go to the app store, search "MyChart", open the app, select , and log in with your MyChart username and password.  Due to Covid, a mask is required upon entering the hospital/clinic. If you do not have a mask, one will be given to you upon arrival. For doctor visits, patients may have 1 support person aged 84 or older with them. For treatment visits, patients cannot have anyone with them due to current Covid guidelines and our immunocompromised population.   Rituximab Injection What is this medication? RITUXIMAB (ri TUX i mab) is a monoclonal antibody. It is used to treat certain types of cancer like non-Hodgkin lymphoma and chronic lymphocytic leukemia. It is also used to treat rheumatoid arthritis, granulomatosis with polyangiitis,microscopic polyangiitis, and pemphigus vulgaris. This medicine  may be used for other purposes; ask your health care provider orpharmacist if you have questions. COMMON BRAND NAME(S): RIABNI, Rituxan, RUXIENCE What should I tell my care team before I take this medication? They need to know if you have any of these  conditions: chest pain heart disease infection especially a viral infection such as chickenpox, cold sores, hepatitis B, or herpes immune system problems irregular heartbeat or rhythm kidney disease low blood counts (white cells, platelets, or red cells) lung disease recent or upcoming vaccine an unusual or allergic reaction to rituximab, other medicines, foods, dyes, or preservatives pregnant or trying to get pregnant breast-feeding How should I use this medication? This medicine is injected into a vein. It is given by a health care provider ina hospital or clinic setting. A special MedGuide will be given to you before each treatment. Be sure to readthis information carefully each time. Talk to your health care provider about the use of this medicine in children. While this drug may be prescribed for children as young as 6 months forselected conditions, precautions do apply. Overdosage: If you think you have taken too much of this medicine contact apoison control center or emergency room at once. NOTE: This medicine is only for you. Do not share this medicine with others. What if I miss a dose? Keep appointments for follow-up doses. It is important not to miss your dose.Call your health care provider if you are unable to keep an appointment. What may interact with this medication? Do not take this medicine with any of the following medicines: live vaccines This medicine may also interact with the following medicines: cisplatin This list may not describe all possible interactions. Give your health care provider a list of all the medicines, herbs, non-prescription drugs, or dietary supplements you use. Also tell them if you smoke, drink alcohol, or use illegaldrugs. Some items may interact with your medicine. What should I watch for while using this medication? Your condition will be monitored carefully while you are receiving thismedicine. You may need blood work done while you are taking  this medicine. This medicine can cause serious infusion reactions. To reduce the risk your health care provider may give you other medicines to take before receiving thisone. Be sure to follow the directions from your health care provider. This medicine may increase your risk of getting an infection. Call your health care provider for advice if you get a fever, chills, sore throat, or other symptoms of a cold or flu. Do not treat yourself. Try to avoid being aroundpeople who are sick. Call your health care provider if you are around anyone with measles,chickenpox, or if you develop sores or blisters that do not heal properly. Avoid taking medicines that contain aspirin, acetaminophen, ibuprofen, naproxen, or ketoprofen unless instructed by your health care provider. Thesemedicines may hide a fever. This medicine may cause serious skin reactions. They can happen weeks to months after starting the medicine. Contact your health care provider right away if you notice fevers or flu-like symptoms with a rash. The rash may be red or purple and then turn into blisters or peeling of the skin. Or, you might notice a red rash with swelling of the face, lips or lymph nodes in your neck or underyour arms. In some patients, this medicine may cause a serious brain infection that may cause death. If you have any problems seeing, thinking, speaking, walking, or standing, tell your healthcare professional right away. If you cannot reachyour healthcare professional, urgently seek other  source of medical care. Do not become pregnant while taking this medicine or for at least 12 months after stopping it. Women should inform their health care provider if they wish to become pregnant or think they might be pregnant. There is potential for serious harm to an unborn child. Talk to your health care provider for more information. Women should use a reliable form of birth control while taking this medicine and for 12 months after  stopping it. Do not breast-feed whiletaking this medicine or for at least 6 months after stopping it. What side effects may I notice from receiving this medication? Side effects that you should report to your health care provider as soon aspossible: allergic reactions (skin rash, itching or hives; swelling of the face, lips, or tongue) diarrhea edema (sudden weight gain; swelling of the ankles, feet, hands or other unusual swelling; trouble breathing) fast, irregular heartbeat heart attack (trouble breathing; pain or tightness in the chest, neck, back or arms; unusually weak or tired) infection (fever, chills, cough, sore throat, pain or trouble passing urine) kidney injury (trouble passing urine or change in the amount of urine) liver injury (dark yellow or brown urine; general ill feeling or flu-like symptoms; loss of appetite, right upper belly pain; unusually weak or tired, yellowing of the eyes or skin) low blood pressure (dizziness; feeling faint or lightheaded, falls; unusually weak or tired) low red blood cell counts (trouble breathing; feeling faint; lightheaded, falls; unusually weak or tired) mouth sores redness, blistering, peeling, or loosening of the skin, including inside the mouth stomach pain unusual bruising or bleeding wheezing (trouble breathing with loud or whistling sounds) vomiting Side effects that usually do not require medical attention (report to yourhealth care provider if they continue or are bothersome): headache joint pain muscle cramps, pain nausea This list may not describe all possible side effects. Call your doctor for medical advice about side effects. You may report side effects to FDA at1-800-FDA-1088. Where should I keep my medication? This medicine is given in a hospital or clinic. It will not be stored at home. NOTE: This sheet is a summary. It may not cover all possible information. If you have questions about this medicine, talk to your doctor,  pharmacist, orhealth care provider.  2022 Elsevier/Gold Standard (2019-12-31 15:47:26)

## 2020-07-29 ENCOUNTER — Telehealth: Payer: Self-pay | Admitting: *Deleted

## 2020-07-29 NOTE — Telephone Encounter (Signed)
-----   Message from Sinda Du, RN sent at 07/28/2020  4:10 PM EDT ----- Regarding: Dr. Irene Limbo - 1st tx f/u Has had rituxan before (2018). New restart

## 2020-07-29 NOTE — Telephone Encounter (Signed)
Called pt to see how she did with her Rituxan yest.  She reports feeling "foggy-Headed" & flushed in face but no other problems.  Informed that flushing may be from steroid but asked her to take her temp just in case & call us if > 100.4 & also if fogginess is not better this pm.  She denies any other symptoms.  She reports being very anxious about various things like her weight, cholesterol, diabetes prevention, etct.  Asked if it would help her to talk with counselor & she is willing to do so.  Referral made to CSW.

## 2020-08-02 ENCOUNTER — Encounter: Payer: Self-pay | Admitting: Hematology

## 2020-08-02 ENCOUNTER — Encounter: Payer: Self-pay | Admitting: *Deleted

## 2020-08-02 NOTE — Progress Notes (Signed)
[facility] Work  Clinical Social Work received referral from Therapist, sports for emotional support and support resources.  CSW contacted patient by phone to offer support and assess for needs.  CSW was unable to reach patient and left a voicemail offering support and encouraging patient to return call.  Johnnye Lana, MSW, LCSW, OSW-C Clinical Social Worker [facility] [phone]

## 2020-08-03 ENCOUNTER — Encounter: Payer: Self-pay | Admitting: Hematology

## 2020-08-03 ENCOUNTER — Encounter: Payer: Self-pay | Admitting: *Deleted

## 2020-08-03 ENCOUNTER — Other Ambulatory Visit: Payer: Self-pay

## 2020-08-03 DIAGNOSIS — C8307 Small cell B-cell lymphoma, spleen: Secondary | ICD-10-CM

## 2020-08-03 NOTE — Progress Notes (Signed)
[facility] Work  Holiday representative received referral from Therapist, sports for support programs and emotional support.  CSW contacted patient by phone to offer support and assess for needs.  Patient stated she was experiencing increased anxiety associated with treatment and adjusting to her diagnosis.  CSW and patient discussed common and appropriate feeling and emotions while in treatment.  CSW provided education on CSW role and the support team at [facility].  Patient expressed interest in support groups and programs.  CSW added patient to the support programs monthly mailing and support group invitation list.  Patient stated she would explore support programs and group options.  CSW and patient discussed individual counseling resources and patient stated she would contact CSW as needed for additional information.  CSW provided contact information and encouraged patient to call with additional questions or concerns.  Johnnye Lana, MSW, LCSW, OSW-C Clinical Social Worker [facility] [phone]

## 2020-08-03 NOTE — Progress Notes (Signed)
[name]    HEMATOLOGY/ONCOLOGY CLINIC NOTE  Date of Service: 08/04/20    Patient Care Team: [name], MD as PCP - General (Family Medicine)  PCP: [name] MD   CHIEF COMPLAINTS/PURPOSE OF CONSULTATION:  F/u for splenic marginal zone lymphoma  HISTORY OF PRESENTING ILLNESS:   Wendy Avery is a wonderful 65 y.o. female who has been referred to [place] by Dr .[name], MD  for evaluation and management of possible lymphoma in the setting of massive splenomegaly and abdominal lymphadenopathy.  Patient patient has no significant chronic medical issues. She presented to urgent care with abdominal bloating and distention for 1-2 months. Also had some abdominal discomfort in the left upper quadrant of the abdomen and notes that she could feel a hard mass. She also notes some aches in her chest for 1-2 months and significant progressive fatigue which is starting to affect her quality of life. Over the last month she also notes some drenching night sweats but isn't sure if this is related to menopausal symptoms. Denies any overt weight loss. Patient has history of anxiety and notes severe anxiety over the symptoms.  Patient had a CT of the abdomen and pelvis with contrast on 08/03/2016 to further evaluate her symptoms. This showed massive splenomegaly with the spleen measuring 23.0 x 20.2 x 11.6 cm (volume = 2820 cm^3). Also noted to have concurrent  mild abdominal adenopathy. Incidental findings of Indeterminate left renal lesion. Most likely a minimally complex cyst. A solid neoplasm cannot be excluded.  Patient  notes that she wants to figure out the diagnosis ASAP and is very anxious. Reports no history of alcohol abuse or liver disease.  She has them out of colonoscopy but has had fecal occult blood testing recently which was negative. Last mammogram April 2017 with the normal limits. Last Pap smear August 2017 was within normal limits.   INTERVAL HISTORY  [name] is here  for her scheduled follow-up for her SMZL. The patient's last visit with [place] was on 06/21/2020. The pt reports that she is doing well overall. She is currently on C2D1 Rituxan.  The pt reports that she had much stress and anxiety due to the steroids for 2-3 days post-treatment. She used the Ativan and was able to get some rest. She got very fatigued. She notes she took her temperature twice and it was 97.5. She denied drinking anything cold prior to this. She notes some pain while urinating for one day that resolved on its own. She was frequently urinating during this time as well. The patient received the Evusheld last week as well.   The patient notes one instance at night of a burning sensation going from her nose to her head and causing a headache.  Lab results today 08/04/2020 of CBC w/diff and CMP is as follows: all values are WNL except for RBC of 5.16, MCV of 79.5, Plt of 116K, Glucose of 115. 08/04/2020 LDH of 161.  On review of systems, pt reports stress, anxiety, pain while urinating, frequent urination and denies fevers, chills, night sweats, abdominal pain, leg swelling, and any other symptoms.  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 #3 right shoulder excision of basal cell carcinoma in 2005   SOCIAL HISTORY: Social History   Socioeconomic History   Marital status: Widowed    Spouse name:  Not on file   Number of children: Not on file   Years of education: Not on file   Highest education level: Not on file  Occupational History   Not on file  Tobacco Use   Smoking status: Former    Types: Cigarettes    Quit date: 2010    Years since quitting: 12.5   Smokeless tobacco: Never  Vaping Use   Vaping Use: Never used  Substance and Sexual Activity   Alcohol use: Yes    Comment:  occassional    Drug use: No   Sexual activity: Not on file  Other Topics Concern   Not on file  Social History Narrative   Not on file   Social Determinants of Health   Financial Resource Strain: Not on file  Food Insecurity: Not on file  Transportation Needs: Not on file  Physical Activity: Not on file  Stress: Not on file  Social Connections: Not on file  Intimate Partner Violence: Not on file  Ex-smoker 1 pack per day quit 30 years ago Uses alcohol socially She is widowed and has 2 daughters   FAMILY HISTORY: Mom had breast cancer at age 82 years and died from metastatic disease Father skin cancer, vocal cord cancer, alcohol abuse  ALLERGIES:  ?PCN  MEDICATIONS:  Current Outpatient Medications  Medication Sig Dispense Refill   LORazepam (ATIVAN) 0.5 MG tablet Take 1 tablet (0.5 mg total) by mouth every 8 (eight) hours as needed for anxiety. 30 tablet 0   No current facility-administered medications for this visit.   Facility-Administered Medications Ordered in Other Visits  Medication Dose Route Frequency Provider Last Rate Last Admin   methylPREDNISolone sodium succinate (SOLU-MEDROL) 125 mg/2 mL injection 125 mg  125 mg Intravenous Daily Brunetta Genera, MD   125 mg at 08/04/20 1011    REVIEW OF SYSTEMS:   10 Point review of Systems was done is negative except as noted above.  PHYSICAL EXAMINATION: ECOG FS:1 - Symptomatic but completely ambulatory  There were no vitals filed for this visit.  Wt Readings from Last 3 Encounters:  08/04/20 211 lb 8 oz (95.9 kg)  06/21/20 217 lb (98.4 kg)  02/11/20 220 lb 11.2 oz (100.1 kg)   There is no height or weight on file to calculate BMI.    Exam was given in infusion.   GENERAL:alert, in no acute distress and comfortable SKIN: no acute rashes, no significant lesions EYES: conjunctiva are pink and non-injected, sclera anicteric OROPHARYNX: MMM, no exudates, no oropharyngeal erythema or ulceration NECK:  supple, no JVD LYMPH:  no palpable lymphadenopathy in the cervical, axillary or inguinal regions LUNGS: clear to auscultation b/l with normal respiratory effort HEART: regular rate & rhythm ABDOMEN:  normoactive bowel sounds , non tender, not distended. Spleen 2-3 finger breadths above the costal margin. Extremity: no pedal edema PSYCH: alert & oriented x 3 with fluent speech NEURO: no focal motor/sensory deficits  LABORATORY DATA:  I have reviewed the data as listed  . CBC Latest Ref Rng & Units 08/04/2020 07/28/2020 07/05/2020  WBC 4.0 - 10.5 K/uL 4.5 4.9 5.8  Hemoglobin 12.0 - 15.0 g/dL 13.4 12.2 13.2  Hematocrit 36.0 - 46.0 % 41.0 37.9 41.6  Platelets 150 - 400 K/uL 116(L) 99(L) 104(L)   CBC    Component Value Date/Time   WBC 4.5 08/04/2020 0846   WBC 5.8 07/05/2020 0900   RBC 5.16 (H) 08/04/2020 0846   HGB 13.4 08/04/2020 0846   HGB 15.0 01/03/2017  1138   HCT 41.0 08/04/2020 0846   HCT 44.4 01/03/2017 1138   PLT 116 (L) 08/04/2020 0846   PLT 169 01/03/2017 1138   MCV 79.5 (L) 08/04/2020 0846   MCV 86.9 01/03/2017 1138   MCH 26.0 08/04/2020 0846   MCHC 32.7 08/04/2020 0846   RDW 14.3 08/04/2020 0846   RDW 12.9 01/03/2017 1138   LYMPHSABS 1.2 08/04/2020 0846   LYMPHSABS 1.8 01/03/2017 1138   MONOABS 0.4 08/04/2020 0846   MONOABS 0.4 01/03/2017 1138   EOSABS 0.1 08/04/2020 0846   EOSABS 0.2 01/03/2017 1138   BASOSABS 0.0 08/04/2020 0846   BASOSABS 0.0 01/03/2017 1138    . CMP Latest Ref Rng & Units 08/04/2020 07/28/2020 07/05/2020  Glucose 70 - 99 mg/dL 115(H) 141(H) 99  BUN 8 - 23 mg/dL 20 18 20   Creatinine 0.44 - 1.00 mg/dL 0.73 0.75 0.82  Sodium 135 - 145 mmol/L 138 141 140  Potassium 3.5 - 5.1 mmol/L 4.4 4.0 4.0  Chloride 98 - 111 mmol/L 107 108 105  CO2 22 - 32 mmol/L 24 24 28   Calcium 8.9 - 10.3 mg/dL 9.3 8.9 9.2  Total Protein 6.5 - 8.1 g/dL 6.5 6.1(L) -  Total Bilirubin 0.3 - 1.2 mg/dL 0.7 0.6 -  Alkaline Phos 38 - 126 U/L 89 99 -  AST 15 - 41 U/L 19 17 -   ALT 0 - 44 U/L 23 20 -    Lab Results  Component Value Date   LDH 161 08/04/2020    ...   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   PERIPHERAL BLOOD:  - Abnormal lymphocytes present.  - Mild thrombocytopenia.   COMMENT:   The marrow is normocellular but exhibits a few small abnormal lymphoid  aggregates. Flow cytometry reveals a monoclonal B-cell population with a  similar phenotype (no CD5 or CD10 expression) to that of the patient's  prior lymphoma. The overall findings are consistent with mild  involvement by the patient's prior non-Hodgkin B-cell lymphoma (splenic  marginal zone lymphoma). While there is no absolute lymphocytosis, there  are abnormal lymphocytes which likely represent peripheral blood  involvement.   07/05/2020 Cytogenetics Report    RADIOGRAPHIC STUDIES: ABDOMEN ULTRASOUND COMPLETE   COMPARISON:  CT 08/03/2016   FINDINGS: Gallbladder: No gallstones or wall thickening visualized. No sonographic Murphy sign noted by sonographer.   Common bile duct: Diameter: 1.8 mm.   Liver: No focal lesion identified. Within normal limits in parenchymal echogenicity. Portal vein is patent on color Doppler imaging with normal direction of blood flow towards the liver.   IVC: No abnormality visualized.   Pancreas: Visualized portion unremarkable.   Spleen: Mild to moderate splenomegaly  as the spleen measures 12.9 cm in  greatest diameter, although splenic volume is increased measuring 785 cubic cm.   Right Kidney: Length: 10.1 cm. Echogenicity within normal limits. No mass or hydronephrosis visualized.   Left Kidney: Length: 10.8 cm. Echogenicity within normal limits. No mass or hydronephrosis visualized.   Abdominal aorta: No aneurysm visualized.   Other findings: None.   IMPRESSION: No acute findings.   Known mild to moderate splenomegaly with splenic volume 785 cubic cm.     Electronically Signed   By: Marin Olp M.D.   On: 11/30/2016 10:09   ASSESSMENT & PLAN:   65 y.o. 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 Diffusely hypermetabolic on PET/CT No evidence of liver disease on CT scan. No evidence of portal hypertension. MPN w/u demonstrated no evidence of genetics mutations to suggest MPN  [place] abd 11/30/2016 - spleen now down to 12.9 cm post Rituxan treatment  #2 Mild thrombocytopenia platelets of 107k. 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 Platelets had normalized completely to 152K.  plt today at 130k - slightly lower in the setting of recent URI  #3 Indetermine left renal lesion 1.5 cms in size ?complex cyst - will need monitoring. No reports of this on [place] ab in 11/2016.  #5 Severe anxiety- improved since initial treatment.   PLAN: -Discussed pt labwork today, 08/04/2020; counts and chemistries starting to improve. LDH stable and normal. -Recommended pt avoid using herbal supplements. Vitamins are acceptable. -Recommended pt avoid uncooked meats and buffets. Raw vegetables are okay at home but avoid out. -Advised pt that the nausea will be helped with the Ativan as well. -Recommended deep breathing and walking when feeling very anxious. -Recommended pt avoid  stressing and focus on remaining at peace and calm. -Discussed continuing staying active, drinking water (minimum 2L liquids daily), and living healthy lifestyle.  -Recommended yogurt with probiotics to reduce risk of yeast infections. Can take chewable probiotic as well. -Avoid contact sports. Resistance exercise and the gym should be okay.  -Recommended pt avoid raw meats. -Will see back in 2 weeks with C4 with labs.   FOLLOW UP: F/u for next 2 doses of weekly Rituxan as per currently scheduled appointments.   The total time spent in the appointment was 30 minutes and more than 50% was on counseling and direct patient cares, ordering and mx of chemotherapy   All of the patient's questions were answered with apparent satisfaction. The patient knows to call the clinic with any problems, questions or concerns.   Sullivan Lone MD [place] AAHIVMS [facility] Hematology/Oncology Physician [facility]  (Office):       [phone] (Work cell):  [phone] (Fax):           [phone]  I, Reinaldo Raddle, am acting as scribe for Dr. Sullivan Lone, MD.   .I have reviewed the above documentation for accuracy and completeness, and I agree with the above. Brunetta Genera MD

## 2020-08-04 ENCOUNTER — Inpatient Hospital Stay: Payer: BC Managed Care – PPO

## 2020-08-04 ENCOUNTER — Other Ambulatory Visit: Payer: Self-pay

## 2020-08-04 ENCOUNTER — Inpatient Hospital Stay (HOSPITAL_BASED_OUTPATIENT_CLINIC_OR_DEPARTMENT_OTHER): Payer: BC Managed Care – PPO | Admitting: Hematology

## 2020-08-04 VITALS — BP 139/55 | HR 64 | Temp 98.1°F | Resp 16 | Wt 211.5 lb

## 2020-08-04 DIAGNOSIS — Z5112 Encounter for antineoplastic immunotherapy: Secondary | ICD-10-CM | POA: Diagnosis not present

## 2020-08-04 DIAGNOSIS — C8307 Small cell B-cell lymphoma, spleen: Secondary | ICD-10-CM

## 2020-08-04 DIAGNOSIS — D696 Thrombocytopenia, unspecified: Secondary | ICD-10-CM

## 2020-08-04 DIAGNOSIS — Z79899 Other long term (current) drug therapy: Secondary | ICD-10-CM | POA: Diagnosis not present

## 2020-08-04 DIAGNOSIS — Z7189 Other specified counseling: Secondary | ICD-10-CM

## 2020-08-04 LAB — CMP (CANCER CENTER ONLY)
ALT: 23 U/L (ref 0–44)
AST: 19 U/L (ref 15–41)
Albumin: 3.8 g/dL (ref 3.5–5.0)
Alkaline Phosphatase: 89 U/L (ref 38–126)
Anion gap: 7 (ref 5–15)
BUN: 20 mg/dL (ref 8–23)
CO2: 24 mmol/L (ref 22–32)
Calcium: 9.3 mg/dL (ref 8.9–10.3)
Chloride: 107 mmol/L (ref 98–111)
Creatinine: 0.73 mg/dL (ref 0.44–1.00)
GFR, Estimated: >60 mL/min (ref >60)
Glucose, Bld: 115 mg/dL — ABNORMAL HIGH (ref 70–99)
Potassium: 4.4 mmol/L (ref 3.5–5.1)
Sodium: 138 mmol/L (ref 135–145)
Total Bilirubin: 0.7 mg/dL (ref 0.3–1.2)
Total Protein: 6.5 g/dL (ref 6.5–8.1)

## 2020-08-04 LAB — CBC WITH DIFFERENTIAL (CANCER CENTER ONLY)
Abs Immature Granulocytes: 0.01 10*3/uL (ref 0.00–0.07)
Basophils Absolute: 0 10*3/uL (ref 0.0–0.1)
Basophils Relative: 1 %
Eosinophils Absolute: 0.1 10*3/uL (ref 0.0–0.5)
Eosinophils Relative: 1 %
HCT: 41 % (ref 36.0–46.0)
Hemoglobin: 13.4 g/dL (ref 12.0–15.0)
Immature Granulocytes: 0 %
Lymphocytes Relative: 26 %
Lymphs Abs: 1.2 10*3/uL (ref 0.7–4.0)
MCH: 26 pg (ref 26.0–34.0)
MCHC: 32.7 g/dL (ref 30.0–36.0)
MCV: 79.5 fL — ABNORMAL LOW (ref 80.0–100.0)
Monocytes Absolute: 0.4 10*3/uL (ref 0.1–1.0)
Monocytes Relative: 10 %
Neutro Abs: 2.8 10*3/uL (ref 1.7–7.7)
Neutrophils Relative %: 62 %
Platelet Count: 116 10*3/uL — ABNORMAL LOW (ref 150–400)
RBC: 5.16 MIL/uL — ABNORMAL HIGH (ref 3.87–5.11)
RDW: 14.3 % (ref 11.5–15.5)
WBC Count: 4.5 10*3/uL (ref 4.0–10.5)
nRBC: 0 % (ref 0.0–0.2)

## 2020-08-04 LAB — LACTATE DEHYDROGENASE: LDH: 161 U/L (ref 98–192)

## 2020-08-04 MED ORDER — METHYLPREDNISOLONE SODIUM SUCC 125 MG IJ SOLR
125.0000 mg | Freq: Every day | INTRAMUSCULAR | Status: DC
  Administered 2020-08-04: 125 mg via INTRAVENOUS

## 2020-08-04 MED ORDER — ACETAMINOPHEN 325 MG PO TABS
650.0000 mg | ORAL_TABLET | Freq: Once | ORAL | Status: AC
Start: 2020-08-04 — End: 2020-08-04
  Administered 2020-08-04: 650 mg via ORAL

## 2020-08-04 MED ORDER — LORAZEPAM 1 MG PO TABS
ORAL_TABLET | ORAL | Status: AC
  Filled 2020-08-04: qty 1

## 2020-08-04 MED ORDER — LORAZEPAM 1 MG PO TABS
0.5000 mg | ORAL_TABLET | Freq: Once | ORAL | Status: AC
  Administered 2020-08-04: 0.5 mg via ORAL

## 2020-08-04 MED ORDER — ACETAMINOPHEN 325 MG PO TABS
ORAL_TABLET | ORAL | Status: AC
  Filled 2020-08-04: qty 2

## 2020-08-04 MED ORDER — METHYLPREDNISOLONE SODIUM SUCC 125 MG IJ SOLR
INTRAMUSCULAR | Status: AC
  Filled 2020-08-04: qty 2

## 2020-08-04 MED ORDER — SODIUM CHLORIDE 0.9 % IV SOLN
Freq: Once | INTRAVENOUS | Status: AC
  Filled 2020-08-04: qty 250

## 2020-08-04 MED ORDER — SODIUM CHLORIDE 0.9 % IV SOLN
375.0000 mg/m2 | Freq: Once | INTRAVENOUS | Status: AC
  Administered 2020-08-04: 800 mg via INTRAVENOUS
  Filled 2020-08-04: qty 50

## 2020-08-04 MED ORDER — FAMOTIDINE 20 MG IN NS 100 ML IVPB
20.0000 mg | Freq: Once | INTRAVENOUS | Status: AC
  Administered 2020-08-04: 20 mg via INTRAVENOUS

## 2020-08-04 MED ORDER — DIPHENHYDRAMINE HCL 25 MG PO CAPS
50.0000 mg | ORAL_CAPSULE | Freq: Once | ORAL | Status: AC
  Administered 2020-08-04: 50 mg via ORAL

## 2020-08-04 MED ORDER — FAMOTIDINE 20 MG IN NS 100 ML IVPB
INTRAVENOUS | Status: AC
  Filled 2020-08-04: qty 100

## 2020-08-04 MED ORDER — DIPHENHYDRAMINE HCL 25 MG PO CAPS
ORAL_CAPSULE | ORAL | Status: AC
  Filled 2020-08-04: qty 2

## 2020-08-04 NOTE — Patient Instructions (Signed)
[facility]   Discharge Instructions: Thank you for choosing [facility] to provide your oncology and hematology care.   If you have a lab appointment with the [facility], please go directly to the [facility] and check in at the registration area.   Wear comfortable clothing and clothing appropriate for easy access to any Portacath or PICC line.   We strive to give you quality time with your provider. You may need to reschedule your appointment if you arrive late (15 or more minutes).  Arriving late affects you and other patients whose appointments are after yours.  Also, if you miss three or more appointments without notifying the office, you may be dismissed from the clinic at the provider's discretion.      For prescription refill requests, have your pharmacy contact our office and allow 72 hours for refills to be completed.    Today you received the following chemotherapy and/or immunotherapy agents: rituximab.      To help prevent nausea and vomiting after your treatment, we encourage you to take your nausea medication as directed.  BELOW ARE SYMPTOMS THAT SHOULD BE REPORTED IMMEDIATELY: *FEVER GREATER THAN 100.4 F (38 C) OR HIGHER *CHILLS OR SWEATING *NAUSEA AND VOMITING THAT IS NOT CONTROLLED WITH YOUR NAUSEA MEDICATION *UNUSUAL SHORTNESS OF BREATH *UNUSUAL BRUISING OR BLEEDING *URINARY PROBLEMS (pain or burning when urinating, or frequent urination) *BOWEL PROBLEMS (unusual diarrhea, constipation, pain near the anus) TENDERNESS IN MOUTH AND THROAT WITH OR WITHOUT PRESENCE OF ULCERS (sore throat, sores in mouth, or a toothache) UNUSUAL RASH, SWELLING OR PAIN  UNUSUAL VAGINAL DISCHARGE OR ITCHING   Items with * indicate a potential emergency and should be followed up as soon as possible or go to the Emergency Department if any problems should occur.  Please show the CHEMOTHERAPY ALERT CARD or IMMUNOTHERAPY ALERT CARD at check-in  to the Emergency Department and triage nurse.  Should you have questions after your visit or need to cancel or reschedule your appointment, please contact [facility]  Dept: [phone]  and follow the prompts.  Office hours are 8:00 a.m. to 4:30 p.m. Monday - Friday. Please note that voicemails left after 4:00 p.m. may not be returned until the following business day.  We are closed weekends and major holidays. You have access to a nurse at all times for urgent questions. Please call the main number to the clinic Dept: [phone] and follow the prompts.   For any non-urgent questions, you may also contact your provider using MyChart. We now offer e-Visits for anyone 72 and older to request care online for non-urgent symptoms. For details visit [email or web address].   Also download the MyChart app! Go to the app store, search "MyChart", open the app, select , and log in with your MyChart username and password.  Due to Covid, a mask is required upon entering the hospital/clinic. If you do not have a mask, one will be given to you upon arrival. For doctor visits, patients may have 1 support person aged 68 or older with them. For treatment visits, patients cannot have anyone with them due to current Covid guidelines and our immunocompromised population.

## 2020-08-10 ENCOUNTER — Other Ambulatory Visit: Payer: Self-pay

## 2020-08-10 ENCOUNTER — Encounter: Payer: Self-pay | Admitting: Hematology

## 2020-08-10 DIAGNOSIS — C8307 Small cell B-cell lymphoma, spleen: Secondary | ICD-10-CM

## 2020-08-10 NOTE — Progress Notes (Incomplete)
Wendy Avery    HEMATOLOGY/ONCOLOGY CLINIC NOTE  Date of Service: 08/10/20    Patient Care Team: Wendy Pepper, Avery as PCP - General (Family Medicine)  PCP: Wendy Avery   CHIEF COMPLAINTS/PURPOSE OF CONSULTATION:  F/u for splenic marginal zone lymphoma  HISTORY OF PRESENTING ILLNESS:   Wendy Avery is a wonderful 65 y.o. female who has been referred to [place] by Dr .Wendy Pepper, Avery  for evaluation and management of possible lymphoma in the setting of massive splenomegaly and abdominal lymphadenopathy.  Patient patient has no significant chronic medical issues. She presented to urgent care with abdominal bloating and distention for 1-2 months. Also had some abdominal discomfort in the left upper quadrant of the abdomen and notes that she could feel a hard mass. She also notes some aches in her chest for 1-2 months and significant progressive fatigue which is starting to affect her quality of life. Over the last month she also notes some drenching night sweats but isn't sure if this is related to menopausal symptoms. Denies any overt weight loss. Patient has history of anxiety and notes severe anxiety over the symptoms.  Patient had a CT of the abdomen and pelvis with contrast on 08/03/2016 to further evaluate her symptoms. This showed massive splenomegaly with the spleen measuring 23.0 x 20.2 x 11.6 cm (volume = 2820 cm^3). Also noted to have concurrent  mild abdominal adenopathy. Incidental findings of Indeterminate left renal lesion. Most likely a minimally complex cyst. A solid neoplasm cannot be excluded.  Patient  notes that she wants to figure out the diagnosis ASAP and is very anxious. Reports no history of alcohol abuse or liver disease.  She has them out of colonoscopy but has had fecal occult blood testing recently which was negative. Last mammogram April 2017 with the normal limits. Last Pap smear August 2017 was within normal limits.   INTERVAL HISTORY  Wendy Avery is here  for her scheduled follow-up for her SMZL. The patient's last visit with [place] was on 08/04/2020. The pt reports that she is doing well overall. She is currently on C3D1 Rituxan.  The pt reports ***  Lab results today 08/11/2020 of CBC w/diff and CMP is as follows: all values are WNL except for ***  On review of systems, pt reports *** and denies *** and any other symptoms.   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 #3 right shoulder excision of basal cell carcinoma in 2005   SOCIAL HISTORY: Social History   Socioeconomic History   Marital status: Widowed    Spouse name: Not on file   Number of children: Not on file   Years of education: Not on file   Highest education level: Not on file  Occupational History   Not on file  Tobacco Use   Smoking status: Former    Types: Cigarettes    Quit date: 2010    Years since quitting: 12.5   Smokeless tobacco: Never  Vaping Use   Vaping Use: Never used  Substance and Sexual Activity   Alcohol use: Yes    Comment: occassional    Drug use: No   Sexual activity: Not on file  Other Topics Concern   Not on file  Social History Narrative   Not on file   Social Determinants of Health   Financial Resource  Strain: Not on file  Food Insecurity: Not on file  Transportation Needs: Not on file  Physical Activity: Not on file  Stress: Not on file  Social Connections: Not on file  Intimate Partner Violence: Not on file  Ex-smoker 1 pack per day quit 30 years ago Uses alcohol socially She is widowed and has 2 daughters   FAMILY HISTORY: Mom had breast cancer at age 83 years and died from metastatic disease Father skin cancer, vocal cord cancer, alcohol abuse  ALLERGIES:  ?PCN  MEDICATIONS:  Current Outpatient Medications  Medication Sig Dispense  Refill   LORazepam (ATIVAN) 0.5 MG tablet Take 1 tablet (0.5 mg total) by mouth every 8 (eight) hours as needed for anxiety. 30 tablet 0   No current facility-administered medications for this visit.    REVIEW OF SYSTEMS:   10 Point review of Systems was done is negative except as noted above.  PHYSICAL EXAMINATION: ECOG FS:1 - Symptomatic but completely ambulatory  There were no vitals filed for this visit.  Wt Readings from Last 3 Encounters:  08/04/20 211 lb 8 oz (95.9 kg)  06/21/20 217 lb (98.4 kg)  02/11/20 220 lb 11.2 oz (100.1 kg)   There is no height or weight on file to calculate BMI.    *** GENERAL:alert, in no acute distress and comfortable SKIN: no acute rashes, no significant lesions EYES: conjunctiva are pink and non-injected, sclera anicteric OROPHARYNX: MMM, no exudates, no oropharyngeal erythema or ulceration NECK: supple, no JVD LYMPH:  no palpable lymphadenopathy in the cervical, axillary or inguinal regions LUNGS: clear to auscultation b/l with normal respiratory effort HEART: regular rate & rhythm ABDOMEN:  normoactive bowel sounds , non tender, not distended. Spleen 2-3 finger breadths above the costal margin. Extremity: no pedal edema PSYCH: alert & oriented x 3 with fluent speech NEURO: no focal motor/sensory deficits  LABORATORY DATA:  I have reviewed the data as listed  . CBC Latest Ref Rng & Units 08/04/2020 07/28/2020 07/05/2020  WBC 4.0 - 10.5 K/uL 4.5 4.9 5.8  Hemoglobin 12.0 - 15.0 g/dL 13.4 12.2 13.2  Hematocrit 36.0 - 46.0 % 41.0 37.9 41.6  Platelets 150 - 400 K/uL 116(L) 99(L) 104(L)   CBC    Component Value Date/Time   WBC 4.5 08/04/2020 0846   WBC 5.8 07/05/2020 0900   RBC 5.16 (H) 08/04/2020 0846   HGB 13.4 08/04/2020 0846   HGB 15.0 01/03/2017 1138   HCT 41.0 08/04/2020 0846   HCT 44.4 01/03/2017 1138   PLT 116 (L) 08/04/2020 0846   PLT 169 01/03/2017 1138   MCV 79.5 (L) 08/04/2020 0846   MCV 86.9 01/03/2017 1138   MCH 26.0  08/04/2020 0846   MCHC 32.7 08/04/2020 0846   RDW 14.3 08/04/2020 0846   RDW 12.9 01/03/2017 1138   LYMPHSABS 1.2 08/04/2020 0846   LYMPHSABS 1.8 01/03/2017 1138   MONOABS 0.4 08/04/2020 0846   MONOABS 0.4 01/03/2017 1138   EOSABS 0.1 08/04/2020 0846   EOSABS 0.2 01/03/2017 1138   BASOSABS 0.0 08/04/2020 0846   BASOSABS 0.0 01/03/2017 1138    . CMP Latest Ref Rng & Units 08/04/2020 07/28/2020 07/05/2020  Glucose 70 - 99 mg/dL 115(H) 141(H) 99  BUN 8 - 23 mg/dL 20 18 20   Creatinine 0.44 - 1.00 mg/dL 0.73 0.75 0.82  Sodium 135 - 145 mmol/L 138 141 140  Potassium 3.5 - 5.1 mmol/L 4.4 4.0 4.0  Chloride 98 - 111 mmol/L 107 108 105  CO2 22 - 32  mmol/L 24 24 28   Calcium 8.9 - 10.3 mg/dL 9.3 8.9 9.2  Total Protein 6.5 - 8.1 g/dL 6.5 6.1(L) -  Total Bilirubin 0.3 - 1.2 mg/dL 0.7 0.6 -  Alkaline Phos 38 - 126 U/L 89 99 -  AST 15 - 41 U/L 19 17 -  ALT 0 - 44 U/L 23 20 -    Lab Results  Component Value Date   LDH 161 08/04/2020    ...   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   PERIPHERAL BLOOD:  - Abnormal lymphocytes present.  - Mild thrombocytopenia.   COMMENT:   The marrow is normocellular but exhibits a few small abnormal lymphoid  aggregates. Flow cytometry reveals a monoclonal B-cell population with a  similar phenotype (no CD5 or CD10 expression) to that of the patient's  prior lymphoma. The overall findings are consistent with mild   involvement by the patient's prior non-Hodgkin B-cell lymphoma (splenic  marginal zone lymphoma). While there is no absolute lymphocytosis, there  are abnormal lymphocytes which likely represent peripheral blood  involvement.   07/05/2020 Cytogenetics Report    RADIOGRAPHIC STUDIES: ABDOMEN ULTRASOUND COMPLETE   COMPARISON:  CT 08/03/2016   FINDINGS: Gallbladder: No gallstones or wall thickening visualized. No sonographic Murphy sign noted by sonographer.   Common bile duct: Diameter: 1.8 mm.   Liver: No focal lesion identified. Within normal limits in parenchymal echogenicity. Portal vein is patent on color Doppler imaging with normal direction of blood flow towards the liver.   IVC: No abnormality visualized.   Pancreas: Visualized portion unremarkable.   Spleen: Mild to moderate splenomegaly as the spleen measures 12.9 cm in greatest diameter, although splenic volume is increased measuring 785 cubic cm.   Right Kidney: Length: 10.1 cm. Echogenicity within normal limits. No mass or hydronephrosis visualized.   Left Kidney: Length: 10.8 cm. Echogenicity within normal limits. No mass or hydronephrosis visualized.   Abdominal aorta: No aneurysm visualized.   Other findings: None.   IMPRESSION: No acute findings.   Known mild to moderate splenomegaly with splenic volume 785 cubic cm.     Electronically Signed   By: Marin Olp M.D.   On: 11/30/2016 10:09   ASSESSMENT & PLAN:   65 y.o. 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 Diffusely hypermetabolic on PET/CT No evidence of liver disease on CT scan. No evidence of portal hypertension. MPN w/u demonstrated no evidence of genetics mutations to suggest MPN  [place] abd 11/30/2016 - spleen now down to 12.9 cm post Rituxan  treatment  #2 Mild  thrombocytopenia platelets of 107k. 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 Platelets had normalized completely to 152K.  plt today at 130k - slightly lower in the setting of recent URI  #3 Indetermine left renal lesion 1.5 cms in size ?complex cyst - will need monitoring. No reports of this on [place] ab in 11/2016.  #5 Severe anxiety- improved since initial treatment.   PLAN: -Discussed pt labwork today, 08/11/2020; ***  -Discussed continuing staying active, drinking water, and living healthy lifestyle.  -Avoid contact sports. Resistance exercise and the gym should be okay.  -Recommended pt avoid raw meats. -Will see back in ***   FOLLOW UP: ***   The total time spent in the appointment was *** minutes and more than 50% was on counseling and direct patient cares.   All of the patient's questions were answered with apparent satisfaction. The patient knows to call the clinic with any problems, questions or concerns.   Sullivan Lone Avery [place] AAHIVMS [facility] Hematology/Oncology Physician [facility]  (Office):       [phone] (Work cell):  [phone] (Fax):           [phone]  I, Reinaldo Raddle, am acting as scribe for Dr. Sullivan Lone, Avery.

## 2020-08-11 ENCOUNTER — Inpatient Hospital Stay: Payer: BC Managed Care – PPO

## 2020-08-11 ENCOUNTER — Inpatient Hospital Stay: Payer: BC Managed Care – PPO | Admitting: Hematology

## 2020-08-11 ENCOUNTER — Other Ambulatory Visit: Payer: Self-pay

## 2020-08-11 VITALS — BP 116/51 | HR 59 | Temp 98.0°F | Resp 17

## 2020-08-11 DIAGNOSIS — C8307 Small cell B-cell lymphoma, spleen: Secondary | ICD-10-CM

## 2020-08-11 DIAGNOSIS — Z7189 Other specified counseling: Secondary | ICD-10-CM

## 2020-08-11 DIAGNOSIS — Z79899 Other long term (current) drug therapy: Secondary | ICD-10-CM | POA: Diagnosis not present

## 2020-08-11 DIAGNOSIS — Z5112 Encounter for antineoplastic immunotherapy: Secondary | ICD-10-CM | POA: Diagnosis not present

## 2020-08-11 LAB — CBC WITH DIFFERENTIAL (CANCER CENTER ONLY)
Abs Immature Granulocytes: 0.03 10*3/uL (ref 0.00–0.07)
Basophils Absolute: 0 10*3/uL (ref 0.0–0.1)
Basophils Relative: 0 %
Eosinophils Absolute: 0.1 10*3/uL (ref 0.0–0.5)
Eosinophils Relative: 2 %
HCT: 39.6 % (ref 36.0–46.0)
Hemoglobin: 13.2 g/dL (ref 12.0–15.0)
Immature Granulocytes: 0 %
Lymphocytes Relative: 21 %
Lymphs Abs: 1.5 10*3/uL (ref 0.7–4.0)
MCH: 26.4 pg (ref 26.0–34.0)
MCHC: 33.3 g/dL (ref 30.0–36.0)
MCV: 79.2 fL — ABNORMAL LOW (ref 80.0–100.0)
Monocytes Absolute: 0.5 10*3/uL (ref 0.1–1.0)
Monocytes Relative: 7 %
Neutro Abs: 4.9 10*3/uL (ref 1.7–7.7)
Neutrophils Relative %: 70 %
Platelet Count: 137 10*3/uL — ABNORMAL LOW (ref 150–400)
RBC: 5 MIL/uL (ref 3.87–5.11)
RDW: 14.5 % (ref 11.5–15.5)
WBC Count: 7 10*3/uL (ref 4.0–10.5)
nRBC: 0 % (ref 0.0–0.2)

## 2020-08-11 LAB — CMP (CANCER CENTER ONLY)
ALT: 24 U/L (ref 0–44)
AST: 20 U/L (ref 15–41)
Albumin: 4.1 g/dL (ref 3.5–5.0)
Alkaline Phosphatase: 68 U/L (ref 38–126)
Anion gap: 6 (ref 5–15)
BUN: 21 mg/dL (ref 8–23)
CO2: 26 mmol/L (ref 22–32)
Calcium: 9.1 mg/dL (ref 8.9–10.3)
Chloride: 107 mmol/L (ref 98–111)
Creatinine: 0.61 mg/dL (ref 0.44–1.00)
GFR, Estimated: >60 mL/min (ref >60)
Glucose, Bld: 110 mg/dL — ABNORMAL HIGH (ref 70–99)
Potassium: 4 mmol/L (ref 3.5–5.1)
Sodium: 139 mmol/L (ref 135–145)
Total Bilirubin: 1.1 mg/dL (ref 0.3–1.2)
Total Protein: 6.2 g/dL — ABNORMAL LOW (ref 6.5–8.1)

## 2020-08-11 LAB — LACTATE DEHYDROGENASE: LDH: 137 U/L (ref 98–192)

## 2020-08-11 MED ORDER — METHYLPREDNISOLONE SODIUM SUCC 125 MG IJ SOLR
125.0000 mg | Freq: Every day | INTRAMUSCULAR | Status: DC
  Administered 2020-08-11: 125 mg via INTRAVENOUS

## 2020-08-11 MED ORDER — FAMOTIDINE 20 MG IN NS 100 ML IVPB
INTRAVENOUS | Status: AC
  Filled 2020-08-11: qty 100

## 2020-08-11 MED ORDER — DIPHENHYDRAMINE HCL 25 MG PO CAPS
ORAL_CAPSULE | ORAL | Status: AC
  Filled 2020-08-11: qty 2

## 2020-08-11 MED ORDER — SODIUM CHLORIDE 0.9 % IV SOLN
Freq: Once | INTRAVENOUS | Status: AC
Start: 2020-08-11 — End: 2020-08-11
  Filled 2020-08-11: qty 250

## 2020-08-11 MED ORDER — DIPHENHYDRAMINE HCL 25 MG PO CAPS
50.0000 mg | ORAL_CAPSULE | Freq: Once | ORAL | Status: AC
  Administered 2020-08-11: 50 mg via ORAL

## 2020-08-11 MED ORDER — SODIUM CHLORIDE 0.9 % IV SOLN
375.0000 mg/m2 | Freq: Once | INTRAVENOUS | Status: AC
  Administered 2020-08-11: 800 mg via INTRAVENOUS
  Filled 2020-08-11: qty 50

## 2020-08-11 MED ORDER — ACETAMINOPHEN 325 MG PO TABS
650.0000 mg | ORAL_TABLET | Freq: Once | ORAL | Status: AC
  Administered 2020-08-11: 650 mg via ORAL

## 2020-08-11 MED ORDER — ACETAMINOPHEN 325 MG PO TABS
ORAL_TABLET | ORAL | Status: AC
  Filled 2020-08-11: qty 2

## 2020-08-11 MED ORDER — LORAZEPAM 1 MG PO TABS
ORAL_TABLET | ORAL | Status: AC
  Filled 2020-08-11: qty 1

## 2020-08-11 MED ORDER — METHYLPREDNISOLONE SODIUM SUCC 125 MG IJ SOLR
INTRAMUSCULAR | Status: AC
  Filled 2020-08-11: qty 2

## 2020-08-11 MED ORDER — FAMOTIDINE 20 MG IN NS 100 ML IVPB
20.0000 mg | Freq: Once | INTRAVENOUS | Status: AC
  Administered 2020-08-11: 20 mg via INTRAVENOUS

## 2020-08-11 MED ORDER — LORAZEPAM 1 MG PO TABS
0.5000 mg | ORAL_TABLET | Freq: Once | ORAL | Status: AC
  Administered 2020-08-11: 0.5 mg via ORAL

## 2020-08-11 NOTE — Patient Instructions (Signed)
[facility] ONCOLOGY  Discharge Instructions: Thank you for choosing [facility] to provide your oncology and hematology care.   If you have a lab appointment with the [facility], please go directly to the [facility] and check in at the registration area.   Wear comfortable clothing and clothing appropriate for easy access to any Portacath or PICC line.   We strive to give you quality time with your provider. You may need to reschedule your appointment if you arrive late (15 or more minutes).  Arriving late affects you and other patients whose appointments are after yours.  Also, if you miss three or more appointments without notifying the office, you may be dismissed from the clinic at the provider's discretion.      For prescription refill requests, have your pharmacy contact our office and allow 72 hours for refills to be completed.    Today you received the following chemotherapy and/or immunotherapy agents: rituximab-pvvr      To help prevent nausea and vomiting after your treatment, we encourage you to take your nausea medication as directed.  BELOW ARE SYMPTOMS THAT SHOULD BE REPORTED IMMEDIATELY: *FEVER GREATER THAN 100.4 F (38 C) OR HIGHER *CHILLS OR SWEATING *NAUSEA AND VOMITING THAT IS NOT CONTROLLED WITH YOUR NAUSEA MEDICATION *UNUSUAL SHORTNESS OF BREATH *UNUSUAL BRUISING OR BLEEDING *URINARY PROBLEMS (pain or burning when urinating, or frequent urination) *BOWEL PROBLEMS (unusual diarrhea, constipation, pain near the anus) TENDERNESS IN MOUTH AND THROAT WITH OR WITHOUT PRESENCE OF ULCERS (sore throat, sores in mouth, or a toothache) UNUSUAL RASH, SWELLING OR PAIN  UNUSUAL VAGINAL DISCHARGE OR ITCHING   Items with * indicate a potential emergency and should be followed up as soon as possible or go to the Emergency Department if any problems should occur.  Please show the CHEMOTHERAPY ALERT CARD or IMMUNOTHERAPY ALERT CARD at  check-in to the Emergency Department and triage nurse.  Should you have questions after your visit or need to cancel or reschedule your appointment, please contact [facility]  Dept: [phone]  and follow the prompts.  Office hours are 8:00 a.m. to 4:30 p.m. Monday - Friday. Please note that voicemails left after 4:00 p.m. may not be returned until the following business day.  We are closed weekends and major holidays. You have access to a nurse at all times for urgent questions. Please call the main number to the clinic Dept: [phone] and follow the prompts.   For any non-urgent questions, you may also contact your provider using MyChart. We now offer e-Visits for anyone 30 and older to request care online for non-urgent symptoms. For details visit [email or web address].   Also download the MyChart app! Go to the app store, search "MyChart", open the app, select [facility], and log in with your MyChart username and password.  Due to Covid, a mask is required upon entering the hospital/clinic. If you do not have a mask, one will be given to you upon arrival. For doctor visits, patients may have 1 support person aged 34 or older with them. For treatment visits, patients cannot have anyone with them due to current Covid guidelines and our immunocompromised population.

## 2020-08-16 ENCOUNTER — Other Ambulatory Visit: Payer: Self-pay

## 2020-08-16 DIAGNOSIS — C8307 Small cell B-cell lymphoma, spleen: Secondary | ICD-10-CM

## 2020-08-17 NOTE — Progress Notes (Signed)
[name]    HEMATOLOGY/ONCOLOGY CLINIC NOTE  Date of Service: .08/18/2020   Patient Care Team: [name], MD as PCP - General (Family Medicine)  PCP: [name] MD   CHIEF COMPLAINTS/PURPOSE OF CONSULTATION:  F/u for splenic marginal zone lymphoma  HISTORY OF PRESENTING ILLNESS:   Wendy Avery is a wonderful 65 y.o. female who has been referred to [place] by Dr .[name], MD  for evaluation and management of possible lymphoma in the setting of massive splenomegaly and abdominal lymphadenopathy.  Patient patient has no significant chronic medical issues. She presented to urgent care with abdominal bloating and distention for 1-2 months. Also had some abdominal discomfort in the left upper quadrant of the abdomen and notes that she could feel a hard mass. She also notes some aches in her chest for 1-2 months and significant progressive fatigue which is starting to affect her quality of life. Over the last month she also notes some drenching night sweats but isn't sure if this is related to menopausal symptoms. Denies any overt weight loss. Patient has history of anxiety and notes severe anxiety over the symptoms.  Patient had a CT of the abdomen and pelvis with contrast on 08/03/2016 to further evaluate her symptoms. This showed massive splenomegaly with the spleen measuring 23.0 x 20.2 x 11.6 cm (volume = 2820 cm^3). Also noted to have concurrent  mild abdominal adenopathy. Incidental findings of Indeterminate left renal lesion. Most likely a minimally complex cyst. A solid neoplasm cannot be excluded.  Patient  notes that she wants to figure out the diagnosis ASAP and is very anxious. Reports no history of alcohol abuse or liver disease.  She has them out of colonoscopy but has had fecal occult blood testing recently which was negative. Last mammogram April 2017 with the normal limits. Last Pap smear August 2017 was within normal limits.   INTERVAL HISTORY  Wendy Avery is  here for her scheduled follow-up for her SMZL. The patient's last visit with [place] was on 08/04/2020. The pt reports that she is doing well overall.  She is here for her fourth weekly dose of Rituxan.  Notes that she has been feeling well with improved fatigue nearly resolved night sweats.  She also reports that her left upper quadrant abdominal pain from splenomegaly has resolved is not making her feel uncomfortable anymore.  The pt reports overall she is in better spirits.  No rashes no fevers no chills.  No other reported significant toxicities from her Rituxan infusion.  He notes some grade 1-2 insomnia on the days of treatment due to steroids.  Lab results today 08/18/2020 of CBC w/diff shows normal hemoglobin of 13.4 with mild thrombocytopenia of 134 k which is improved from her previous counts at 99 k.  WBC counts are within normal limits at 5.5k with an absolute lymphocyte count of 1.1k  and CMP are unremarkable except for blood sugar level of 191. LDH within normal limits at 149  On review of systems, pt reports no other acute new symptoms.  She is grateful for tolerating treatment okay.   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 #3 right shoulder excision of basal cell carcinoma in 2005   SOCIAL HISTORY: Social History   Socioeconomic History   Marital status: Widowed    Spouse name: Not on file  Number of children: Not on file   Years of education: Not on file   Highest education level: Not on file  Occupational History   Not on file  Tobacco Use   Smoking status: Former    Types: Cigarettes    Quit date: 2010    Years since quitting: 12.5   Smokeless tobacco: Never  Vaping Use   Vaping Use: Never used  Substance and Sexual Activity   Alcohol use: Yes    Comment: occassional    Drug  use: No   Sexual activity: Not on file  Other Topics Concern   Not on file  Social History Narrative   Not on file   Social Determinants of Health   Financial Resource Strain: Not on file  Food Insecurity: Not on file  Transportation Needs: Not on file  Physical Activity: Not on file  Stress: Not on file  Social Connections: Not on file  Intimate Partner Violence: Not on file  Ex-smoker 1 pack per day quit 30 years ago Uses alcohol socially She is widowed and has 2 daughters   FAMILY HISTORY: Mom had breast cancer at age 108 years and died from metastatic disease Father skin cancer, vocal cord cancer, alcohol abuse  ALLERGIES:  ?PCN  MEDICATIONS:  Current Outpatient Medications  Medication Sig Dispense Refill   LORazepam (ATIVAN) 0.5 MG tablet Take 1 tablet (0.5 mg total) by mouth every 8 (eight) hours as needed for anxiety. 30 tablet 0   No current facility-administered medications for this visit.    REVIEW OF SYSTEMS:   10 Point review of Systems was done is negative except as noted above.  PHYSICAL EXAMINATION: ECOG FS:1 - Symptomatic but completely ambulatory  Vitals:   08/18/20 0855  BP: (!) 126/43  Pulse: 62  Resp: 18  Temp: 98.2 F (36.8 C)  SpO2: 100%    Wt Readings from Last 3 Encounters:  08/04/20 211 lb 8 oz (95.9 kg)  06/21/20 217 lb (98.4 kg)  02/11/20 220 lb 11.2 oz (100.1 kg)   Body mass index is 40.02 kg/m.    No acute distress GENERAL:alert, in no acute distress and comfortable SKIN: no acute rashes, no significant lesions EYES: conjunctiva are pink and non-injected, sclera anicteric OROPHARYNX: MMM, no exudates, no oropharyngeal erythema or ulceration NECK: supple, no JVD LYMPH:  no palpable lymphadenopathy in the cervical, axillary or inguinal regions LUNGS: clear to auscultation b/l with normal respiratory effort HEART: regular rate & rhythm ABDOMEN:  normoactive bowel sounds , non tender, not distended. Spleen 2-3 finger breadths  above the costal margin. Extremity: no pedal edema PSYCH: alert & oriented x 3 with fluent speech NEURO: no focal motor/sensory deficits  LABORATORY DATA:  I have reviewed the data as listed  . CBC Latest Ref Rng & Units 08/11/2020 08/04/2020 07/28/2020  WBC 4.0 - 10.5 K/uL 7.0 4.5 4.9  Hemoglobin 12.0 - 15.0 g/dL 13.2 13.4 12.2  Hematocrit 36.0 - 46.0 % 39.6 41.0 37.9  Platelets 150 - 400 K/uL 137(L) 116(L) 99(L)   CBC    Component Value Date/Time   WBC 7.0 08/11/2020 1005   WBC 5.8 07/05/2020 0900   RBC 5.00 08/11/2020 1005   HGB 13.2 08/11/2020 1005   HGB 15.0 01/03/2017 1138   HCT 39.6 08/11/2020 1005   HCT 44.4 01/03/2017 1138   PLT 137 (L) 08/11/2020 1005   PLT 169 01/03/2017 1138   MCV 79.2 (L) 08/11/2020 1005   MCV 86.9 01/03/2017 1138   MCH 26.4  08/11/2020 1005   MCHC 33.3 08/11/2020 1005   RDW 14.5 08/11/2020 1005   RDW 12.9 01/03/2017 1138   LYMPHSABS 1.5 08/11/2020 1005   LYMPHSABS 1.8 01/03/2017 1138   MONOABS 0.5 08/11/2020 1005   MONOABS 0.4 01/03/2017 1138   EOSABS 0.1 08/11/2020 1005   EOSABS 0.2 01/03/2017 1138   BASOSABS 0.0 08/11/2020 1005   BASOSABS 0.0 01/03/2017 1138    . CMP Latest Ref Rng & Units 08/11/2020 08/04/2020 07/28/2020  Glucose 70 - 99 mg/dL 110(H) 115(H) 141(H)  BUN 8 - 23 mg/dL '21 20 18  '$ Creatinine 0.44 - 1.00 mg/dL 0.61 0.73 0.75  Sodium 135 - 145 mmol/L 139 138 141  Potassium 3.5 - 5.1 mmol/L 4.0 4.4 4.0  Chloride 98 - 111 mmol/L 107 107 108  CO2 22 - 32 mmol/L '26 24 24  '$ Calcium 8.9 - 10.3 mg/dL 9.1 9.3 8.9  Total Protein 6.5 - 8.1 g/dL 6.2(L) 6.5 6.1(L)  Total Bilirubin 0.3 - 1.2 mg/dL 1.1 0.7 0.6  Alkaline Phos 38 - 126 U/L 68 89 99  AST 15 - 41 U/L '20 19 17  '$ ALT 0 - 44 U/L '24 23 20    '$ Lab Results  Component Value Date   LDH 137 08/11/2020    ...   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   PERIPHERAL BLOOD:  - Abnormal lymphocytes present.  - Mild thrombocytopenia.   COMMENT:   The marrow is normocellular but exhibits a few small abnormal lymphoid  aggregates. Flow cytometry reveals a monoclonal B-cell population with a  similar phenotype (no CD5 or CD10 expression) to that of the patient's  prior lymphoma. The overall findings are consistent with mild  involvement by the patient's prior non-Hodgkin B-cell lymphoma (splenic  marginal zone lymphoma). While there is no absolute lymphocytosis, there  are abnormal lymphocytes which likely represent peripheral blood  involvement.   07/05/2020 Cytogenetics Report    RADIOGRAPHIC STUDIES: ABDOMEN ULTRASOUND COMPLETE   COMPARISON:  CT 08/03/2016   FINDINGS: Gallbladder: No gallstones or wall thickening visualized. No sonographic Murphy sign noted by sonographer.   Common bile duct: Diameter: 1.8 mm.   Liver: No focal lesion identified. Within normal limits in parenchymal echogenicity. Portal vein is patent on color Doppler imaging with normal direction of blood flow towards the liver.   IVC: No abnormality visualized.   Pancreas: Visualized portion unremarkable.   Spleen: Mild to moderate splenomegaly as the spleen measures 12.9 cm in greatest diameter, although splenic volume is increased measuring 785 cubic cm.   Right Kidney: Length: 10.1 cm. Echogenicity within normal limits. No mass or hydronephrosis visualized.   Left Kidney: Length: 10.8 cm. Echogenicity within  normal limits. No mass or  hydronephrosis visualized.   Abdominal aorta: No aneurysm visualized.   Other findings: None.   IMPRESSION: No acute findings.   Known mild to moderate splenomegaly with splenic volume 785 cubic cm.     Electronically Signed   By: Marin Olp M.D.   On: 11/30/2016 10:09   ASSESSMENT & PLAN:   65 y.o. 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 Diffusely hypermetabolic on PET/CT No evidence of liver disease on CT scan. No evidence of portal hypertension. MPN w/u demonstrated no evidence of genetics mutations to suggest MPN  [place] abd 11/30/2016 - spleen now down to 12.9 cm post Rituxan treatment  #2 Mild thrombocytopenia platelets of 107k. 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 Platelets are improved from 99k to 134k  #3 Indetermine left renal lesion 1.5 cms in size ?complex cyst - will need monitoring. No reports of this on [place] ab in 11/2016.  #4 Severe anxiety- improved since initial treatment.   PLAN: -Discussed pt labwork today, 08/18/2020 reviewed with the patient as noted above. -LDH within normal limits -Discussed continuing staying active, drinking water (minimum 2L liquids daily), and living healthy lifestyle.  -Recommended yogurt with probiotics to reduce risk of yeast infections. Can take chewable probiotic as well. -Patient significant toxicities with Rituxan has had improvement in her counts and clinically significant improvement in her splenomegaly with resolution of abdominal symptoms. -We discussed and patient is agreeable to proceeding with maintenance Rituxan for 1 to 2 years to try to get deeper response with her relapsed splenic marginal zone lymphoma -Is up-to-date with her COVID vaccination and has received her first dose of Evusheld on  07/28/2020 FOLLOW UP: Please schedule for cycle 1 of maintenance Rituxan in 2 months with labs and MD visit    The total time spent in the appointment was 30 minutes and more than 50% was on counseling and direct patient cares, ordering and management of Rituxan immunotherapy.   All of the patient's questions were answered with apparent satisfaction. The patient knows to call the clinic with any problems, questions or concerns.   Sullivan Lone MD [place] AAHIVMS [facility] Hematology/Oncology Physician [facility]  (Office):       [phone] (Work cell):  [phone] (Fax):           [phone]  I, Reinaldo Raddle, am acting as scribe for Dr. Sullivan Lone, MD   .I have reviewed the above documentation for accuracy and completeness, and I agree with the above. Brunetta Genera MD

## 2020-08-18 ENCOUNTER — Inpatient Hospital Stay: Payer: BC Managed Care – PPO

## 2020-08-18 ENCOUNTER — Inpatient Hospital Stay (HOSPITAL_BASED_OUTPATIENT_CLINIC_OR_DEPARTMENT_OTHER): Payer: BC Managed Care – PPO | Admitting: Hematology

## 2020-08-18 ENCOUNTER — Other Ambulatory Visit: Payer: Self-pay

## 2020-08-18 VITALS — BP 124/46 | HR 56 | Temp 97.9°F | Resp 16

## 2020-08-18 VITALS — BP 126/43 | HR 62 | Temp 98.2°F | Resp 18 | Ht 61.0 in | Wt 211.8 lb

## 2020-08-18 DIAGNOSIS — Z7189 Other specified counseling: Secondary | ICD-10-CM

## 2020-08-18 DIAGNOSIS — Z5112 Encounter for antineoplastic immunotherapy: Secondary | ICD-10-CM

## 2020-08-18 DIAGNOSIS — C8307 Small cell B-cell lymphoma, spleen: Secondary | ICD-10-CM

## 2020-08-18 DIAGNOSIS — Z79899 Other long term (current) drug therapy: Secondary | ICD-10-CM | POA: Diagnosis not present

## 2020-08-18 LAB — CBC WITH DIFFERENTIAL (CANCER CENTER ONLY)
Abs Immature Granulocytes: 0.02 10*3/uL (ref 0.00–0.07)
Basophils Absolute: 0 10*3/uL (ref 0.0–0.1)
Basophils Relative: 0 %
Eosinophils Absolute: 0.1 10*3/uL (ref 0.0–0.5)
Eosinophils Relative: 2 %
HCT: 40.2 % (ref 36.0–46.0)
Hemoglobin: 13.4 g/dL (ref 12.0–15.0)
Immature Granulocytes: 0 %
Lymphocytes Relative: 20 %
Lymphs Abs: 1.1 10*3/uL (ref 0.7–4.0)
MCH: 26.8 pg (ref 26.0–34.0)
MCHC: 33.3 g/dL (ref 30.0–36.0)
MCV: 80.4 fL (ref 80.0–100.0)
Monocytes Absolute: 0.3 10*3/uL (ref 0.1–1.0)
Monocytes Relative: 5 %
Neutro Abs: 4 10*3/uL (ref 1.7–7.7)
Neutrophils Relative %: 73 %
Platelet Count: 134 10*3/uL — ABNORMAL LOW (ref 150–400)
RBC: 5 MIL/uL (ref 3.87–5.11)
RDW: 14.7 % (ref 11.5–15.5)
WBC Count: 5.5 10*3/uL (ref 4.0–10.5)
nRBC: 0 % (ref 0.0–0.2)

## 2020-08-18 LAB — CMP (CANCER CENTER ONLY)
ALT: 26 U/L (ref 0–44)
AST: 15 U/L (ref 15–41)
Albumin: 3.8 g/dL (ref 3.5–5.0)
Alkaline Phosphatase: 80 U/L (ref 38–126)
Anion gap: 10 (ref 5–15)
BUN: 19 mg/dL (ref 8–23)
CO2: 24 mmol/L (ref 22–32)
Calcium: 9.6 mg/dL (ref 8.9–10.3)
Chloride: 107 mmol/L (ref 98–111)
Creatinine: 0.83 mg/dL (ref 0.44–1.00)
GFR, Estimated: >60 mL/min (ref >60)
Glucose, Bld: 191 mg/dL — ABNORMAL HIGH (ref 70–99)
Potassium: 3.8 mmol/L (ref 3.5–5.1)
Sodium: 141 mmol/L (ref 135–145)
Total Bilirubin: 1.1 mg/dL (ref 0.3–1.2)
Total Protein: 6.3 g/dL — ABNORMAL LOW (ref 6.5–8.1)

## 2020-08-18 LAB — LACTATE DEHYDROGENASE: LDH: 149 U/L (ref 98–192)

## 2020-08-18 MED ORDER — SODIUM CHLORIDE 0.9 % IV SOLN
Freq: Once | INTRAVENOUS | Status: AC
  Filled 2020-08-18: qty 250

## 2020-08-18 MED ORDER — ACETAMINOPHEN 325 MG PO TABS
ORAL_TABLET | ORAL | Status: AC
  Filled 2020-08-18: qty 2

## 2020-08-18 MED ORDER — METHYLPREDNISOLONE SODIUM SUCC 125 MG IJ SOLR
INTRAMUSCULAR | Status: AC
  Filled 2020-08-18: qty 2

## 2020-08-18 MED ORDER — DIPHENHYDRAMINE HCL 25 MG PO CAPS
50.0000 mg | ORAL_CAPSULE | Freq: Once | ORAL | Status: AC
  Administered 2020-08-18: 50 mg via ORAL

## 2020-08-18 MED ORDER — LORAZEPAM 1 MG PO TABS
0.5000 mg | ORAL_TABLET | Freq: Once | ORAL | Status: AC
  Administered 2020-08-18: 0.5 mg via ORAL

## 2020-08-18 MED ORDER — DIPHENHYDRAMINE HCL 25 MG PO CAPS
ORAL_CAPSULE | ORAL | Status: AC
  Filled 2020-08-18: qty 2

## 2020-08-18 MED ORDER — FAMOTIDINE 20 MG IN NS 100 ML IVPB
20.0000 mg | Freq: Once | INTRAVENOUS | Status: AC
  Administered 2020-08-18: 20 mg via INTRAVENOUS

## 2020-08-18 MED ORDER — LORAZEPAM 1 MG PO TABS
ORAL_TABLET | ORAL | Status: AC
  Filled 2020-08-18: qty 1

## 2020-08-18 MED ORDER — ACETAMINOPHEN 325 MG PO TABS
650.0000 mg | ORAL_TABLET | Freq: Once | ORAL | Status: AC
  Administered 2020-08-18: 650 mg via ORAL

## 2020-08-18 MED ORDER — SODIUM CHLORIDE 0.9 % IV SOLN
375.0000 mg/m2 | Freq: Once | INTRAVENOUS | Status: AC
  Administered 2020-08-18: 800 mg via INTRAVENOUS
  Filled 2020-08-18: qty 50

## 2020-08-18 MED ORDER — METHYLPREDNISOLONE SODIUM SUCC 125 MG IJ SOLR
125.0000 mg | Freq: Every day | INTRAMUSCULAR | Status: DC
  Administered 2020-08-18: 125 mg via INTRAVENOUS

## 2020-08-18 MED ORDER — FAMOTIDINE 20 MG IN NS 100 ML IVPB
INTRAVENOUS | Status: AC
  Filled 2020-08-18: qty 100

## 2020-08-18 NOTE — Patient Instructions (Signed)
[facility]   Discharge Instructions: Thank you for choosing [facility] to provide your oncology and hematology care.   If you have a lab appointment with the [facility], please go directly to the [facility] and check in at the registration area.   Wear comfortable clothing and clothing appropriate for easy access to any Portacath or PICC line.   We strive to give you quality time with your provider. You may need to reschedule your appointment if you arrive late (15 or more minutes).  Arriving late affects you and other patients whose appointments are after yours.  Also, if you miss three or more appointments without notifying the office, you may be dismissed from the clinic at the provider's discretion.      For prescription refill requests, have your pharmacy contact our office and allow 72 hours for refills to be completed.    Today you received the following chemotherapy and/or immunotherapy agents: rituximab.      To help prevent nausea and vomiting after your treatment, we encourage you to take your nausea medication as directed.  BELOW ARE SYMPTOMS THAT SHOULD BE REPORTED IMMEDIATELY: *FEVER GREATER THAN 100.4 F (38 C) OR HIGHER *CHILLS OR SWEATING *NAUSEA AND VOMITING THAT IS NOT CONTROLLED WITH YOUR NAUSEA MEDICATION *UNUSUAL SHORTNESS OF BREATH *UNUSUAL BRUISING OR BLEEDING *URINARY PROBLEMS (pain or burning when urinating, or frequent urination) *BOWEL PROBLEMS (unusual diarrhea, constipation, pain near the anus) TENDERNESS IN MOUTH AND THROAT WITH OR WITHOUT PRESENCE OF ULCERS (sore throat, sores in mouth, or a toothache) UNUSUAL RASH, SWELLING OR PAIN  UNUSUAL VAGINAL DISCHARGE OR ITCHING   Items with * indicate a potential emergency and should be followed up as soon as possible or go to the Emergency Department if any problems should occur.  Please show the CHEMOTHERAPY ALERT CARD or IMMUNOTHERAPY ALERT CARD at check-in  to the Emergency Department and triage nurse.  Should you have questions after your visit or need to cancel or reschedule your appointment, please contact [facility]  Dept: [phone]  and follow the prompts.  Office hours are 8:00 a.m. to 4:30 p.m. Monday - Friday. Please note that voicemails left after 4:00 p.m. may not be returned until the following business day.  We are closed weekends and major holidays. You have access to a nurse at all times for urgent questions. Please call the main number to the clinic Dept: [phone] and follow the prompts.   For any non-urgent questions, you may also contact your provider using MyChart. We now offer e-Visits for anyone 72 and older to request care online for non-urgent symptoms. For details visit [email or web address].   Also download the MyChart app! Go to the app store, search "MyChart", open the app, select , and log in with your MyChart username and password.  Due to Covid, a mask is required upon entering the hospital/clinic. If you do not have a mask, one will be given to you upon arrival. For doctor visits, patients may have 1 support person aged 68 or older with them. For treatment visits, patients cannot have anyone with them due to current Covid guidelines and our immunocompromised population.

## 2020-08-19 ENCOUNTER — Telehealth: Payer: Self-pay | Admitting: Hematology

## 2020-08-19 NOTE — Telephone Encounter (Signed)
Scheduled follow-up appointment per 7/28 los. Patient is aware. 

## 2020-08-24 ENCOUNTER — Encounter: Payer: Self-pay | Admitting: Hematology

## 2020-09-22 DIAGNOSIS — Z23 Encounter for immunization: Secondary | ICD-10-CM | POA: Diagnosis not present

## 2020-09-22 DIAGNOSIS — E785 Hyperlipidemia, unspecified: Secondary | ICD-10-CM | POA: Diagnosis not present

## 2020-09-22 DIAGNOSIS — C8580 Other specified types of non-Hodgkin lymphoma, unspecified site: Secondary | ICD-10-CM | POA: Diagnosis not present

## 2020-09-22 DIAGNOSIS — E559 Vitamin D deficiency, unspecified: Secondary | ICD-10-CM | POA: Diagnosis not present

## 2020-10-18 ENCOUNTER — Other Ambulatory Visit: Payer: Self-pay

## 2020-10-18 DIAGNOSIS — C8307 Small cell B-cell lymphoma, spleen: Secondary | ICD-10-CM

## 2020-10-19 ENCOUNTER — Other Ambulatory Visit: Payer: Self-pay

## 2020-10-19 ENCOUNTER — Inpatient Hospital Stay: Payer: BC Managed Care – PPO

## 2020-10-19 ENCOUNTER — Inpatient Hospital Stay: Payer: BC Managed Care – PPO | Attending: Hematology

## 2020-10-19 ENCOUNTER — Inpatient Hospital Stay (HOSPITAL_BASED_OUTPATIENT_CLINIC_OR_DEPARTMENT_OTHER): Payer: BC Managed Care – PPO | Admitting: Hematology

## 2020-10-19 VITALS — BP 134/61 | HR 57 | Temp 98.0°F | Resp 17 | Wt 217.4 lb

## 2020-10-19 VITALS — BP 127/58 | HR 68 | Resp 17

## 2020-10-19 DIAGNOSIS — C8307 Small cell B-cell lymphoma, spleen: Secondary | ICD-10-CM

## 2020-10-19 DIAGNOSIS — Z5112 Encounter for antineoplastic immunotherapy: Secondary | ICD-10-CM | POA: Insufficient documentation

## 2020-10-19 DIAGNOSIS — Z7189 Other specified counseling: Secondary | ICD-10-CM

## 2020-10-19 LAB — CBC WITH DIFFERENTIAL (CANCER CENTER ONLY)
Abs Immature Granulocytes: 0.02 10*3/uL (ref 0.00–0.07)
Basophils Absolute: 0 10*3/uL (ref 0.0–0.1)
Basophils Relative: 1 %
Eosinophils Absolute: 0.2 10*3/uL (ref 0.0–0.5)
Eosinophils Relative: 4 %
HCT: 41.1 % (ref 36.0–46.0)
Hemoglobin: 13.8 g/dL (ref 12.0–15.0)
Immature Granulocytes: 0 %
Lymphocytes Relative: 24 %
Lymphs Abs: 1.5 10*3/uL (ref 0.7–4.0)
MCH: 27.9 pg (ref 26.0–34.0)
MCHC: 33.6 g/dL (ref 30.0–36.0)
MCV: 83.2 fL (ref 80.0–100.0)
Monocytes Absolute: 0.5 10*3/uL (ref 0.1–1.0)
Monocytes Relative: 9 %
Neutro Abs: 3.7 10*3/uL (ref 1.7–7.7)
Neutrophils Relative %: 62 %
Platelet Count: 141 10*3/uL — ABNORMAL LOW (ref 150–400)
RBC: 4.94 MIL/uL (ref 3.87–5.11)
RDW: 14.6 % (ref 11.5–15.5)
WBC Count: 5.9 10*3/uL (ref 4.0–10.5)
nRBC: 0 % (ref 0.0–0.2)

## 2020-10-19 LAB — CMP (CANCER CENTER ONLY)
ALT: 31 U/L (ref 0–44)
AST: 22 U/L (ref 15–41)
Albumin: 4 g/dL (ref 3.5–5.0)
Alkaline Phosphatase: 79 U/L (ref 38–126)
Anion gap: 12 (ref 5–15)
BUN: 21 mg/dL (ref 8–23)
CO2: 23 mmol/L (ref 22–32)
Calcium: 9.2 mg/dL (ref 8.9–10.3)
Chloride: 108 mmol/L (ref 98–111)
Creatinine: 0.76 mg/dL (ref 0.44–1.00)
GFR, Estimated: >60 mL/min (ref >60)
Glucose, Bld: 96 mg/dL (ref 70–99)
Potassium: 4.1 mmol/L (ref 3.5–5.1)
Sodium: 143 mmol/L (ref 135–145)
Total Bilirubin: 0.8 mg/dL (ref 0.3–1.2)
Total Protein: 6.3 g/dL — ABNORMAL LOW (ref 6.5–8.1)

## 2020-10-19 LAB — LACTATE DEHYDROGENASE: LDH: 170 U/L (ref 98–192)

## 2020-10-19 MED ORDER — METHYLPREDNISOLONE SODIUM SUCC 125 MG IJ SOLR
125.0000 mg | Freq: Once | INTRAMUSCULAR | Status: AC
  Administered 2020-10-19: 125 mg via INTRAVENOUS

## 2020-10-19 MED ORDER — DIPHENHYDRAMINE HCL 25 MG PO CAPS
50.0000 mg | ORAL_CAPSULE | Freq: Once | ORAL | Status: AC
  Administered 2020-10-19: 50 mg via ORAL
  Filled 2020-10-19: qty 2

## 2020-10-19 MED ORDER — FAMOTIDINE 20 MG IN NS 100 ML IVPB
20.0000 mg | Freq: Once | INTRAVENOUS | Status: AC
  Administered 2020-10-19: 20 mg via INTRAVENOUS
  Filled 2020-10-19: qty 100

## 2020-10-19 MED ORDER — LORAZEPAM 1 MG PO TABS
0.5000 mg | ORAL_TABLET | Freq: Once | ORAL | Status: AC
  Administered 2020-10-19: 0.5 mg via ORAL
  Filled 2020-10-19: qty 1

## 2020-10-19 MED ORDER — SODIUM CHLORIDE 0.9 % IV SOLN: Freq: Once | INTRAVENOUS | Status: AC

## 2020-10-19 MED ORDER — METHYLPREDNISOLONE SODIUM SUCC 125 MG IJ SOLR
125.0000 mg | Freq: Every day | INTRAMUSCULAR | Status: DC
  Filled 2020-10-19: qty 2

## 2020-10-19 MED ORDER — ACETAMINOPHEN 325 MG PO TABS
650.0000 mg | ORAL_TABLET | Freq: Once | ORAL | Status: AC
  Administered 2020-10-19: 650 mg via ORAL
  Filled 2020-10-19: qty 2

## 2020-10-19 MED ORDER — SODIUM CHLORIDE 0.9 % IV SOLN
375.0000 mg/m2 | Freq: Once | INTRAVENOUS | Status: AC
  Administered 2020-10-19: 800 mg via INTRAVENOUS
  Filled 2020-10-19: qty 50

## 2020-10-19 NOTE — Patient Instructions (Signed)
[facility] ONCOLOGY  Discharge Instructions: Thank you for choosing [facility] to provide your oncology and hematology care.   If you have a lab appointment with the [facility], please go directly to the [facility] and check in at the registration area.   Wear comfortable clothing and clothing appropriate for easy access to any Portacath or PICC line.   We strive to give you quality time with your provider. You may need to reschedule your appointment if you arrive late (15 or more minutes).  Arriving late affects you and other patients whose appointments are after yours.  Also, if you miss three or more appointments without notifying the office, you may be dismissed from the clinic at the provider's discretion.      For prescription refill requests, have your pharmacy contact our office and allow 72 hours for refills to be completed.    Today you received the following chemotherapy and/or immunotherapy agents : rituximab      To help prevent nausea and vomiting after your treatment, we encourage you to take your nausea medication as directed.  BELOW ARE SYMPTOMS THAT SHOULD BE REPORTED IMMEDIATELY: *FEVER GREATER THAN 100.4 F (38 C) OR HIGHER *CHILLS OR SWEATING *NAUSEA AND VOMITING THAT IS NOT CONTROLLED WITH YOUR NAUSEA MEDICATION *UNUSUAL SHORTNESS OF BREATH *UNUSUAL BRUISING OR BLEEDING *URINARY PROBLEMS (pain or burning when urinating, or frequent urination) *BOWEL PROBLEMS (unusual diarrhea, constipation, pain near the anus) TENDERNESS IN MOUTH AND THROAT WITH OR WITHOUT PRESENCE OF ULCERS (sore throat, sores in mouth, or a toothache) UNUSUAL RASH, SWELLING OR PAIN  UNUSUAL VAGINAL DISCHARGE OR ITCHING   Items with * indicate a potential emergency and should be followed up as soon as possible or go to the Emergency Department if any problems should occur.  Please show the CHEMOTHERAPY ALERT CARD or IMMUNOTHERAPY ALERT CARD at check-in to  the Emergency Department and triage nurse.  Should you have questions after your visit or need to cancel or reschedule your appointment, please contact [facility]  Dept: [phone]  and follow the prompts.  Office hours are 8:00 a.m. to 4:30 p.m. Monday - Friday. Please note that voicemails left after 4:00 p.m. may not be returned until the following business day.  We are closed weekends and major holidays. You have access to a nurse at all times for urgent questions. Please call the main number to the clinic Dept: [phone] and follow the prompts.   For any non-urgent questions, you may also contact your provider using MyChart. We now offer e-Visits for anyone 3 and older to request care online for non-urgent symptoms. For details visit [email or web address].   Also download the MyChart app! Go to the app store, search "MyChart", open the app, select [facility], and log in with your MyChart username and password.  Due to Covid, a mask is required upon entering the hospital/clinic. If you do not have a mask, one will be given to you upon arrival. For doctor visits, patients may have 1 support person aged 75 or older with them. For treatment visits, patients cannot have anyone with them due to current Covid guidelines and our immunocompromised population.

## 2020-10-25 ENCOUNTER — Encounter: Payer: Self-pay | Admitting: Hematology

## 2020-10-25 NOTE — Progress Notes (Signed)
Wendy Avery    HEMATOLOGY/ONCOLOGY CLINIC NOTE  Date of Service: .10/19/2020   Patient Care Team: [name], MD as PCP - General (Family Medicine)  PCP: [name] MD   CHIEF COMPLAINTS/PURPOSE OF CONSULTATION:  F/u for splenic marginal zone lymphoma  HISTORY OF PRESENTING ILLNESS:   Wendy Avery is a wonderful 65 y.o. female who has been referred to [place] by Dr .[name], MD  for evaluation and management of possible lymphoma in the setting of massive splenomegaly and abdominal lymphadenopathy.  Patient patient has no significant chronic medical issues. She presented to urgent care with abdominal bloating and distention for 1-2 months. Also had some abdominal discomfort in the left upper quadrant of the abdomen and notes that she could feel a hard mass. She also notes some aches in her chest for 1-2 months and significant progressive fatigue which is starting to affect her quality of life. Over the last month she also notes some drenching night sweats but isn't sure if this is related to menopausal symptoms. Denies any overt weight loss. Patient has history of anxiety and notes severe anxiety over the symptoms.  Patient had a CT of the abdomen and pelvis with contrast on 08/03/2016 to further evaluate her symptoms. This showed massive splenomegaly with the spleen measuring 23.0 x 20.2 x 11.6 cm (volume = 2820 cm^3). Also noted to have concurrent  mild abdominal adenopathy. Incidental findings of Indeterminate left renal lesion. Most likely a minimally complex cyst. A solid neoplasm cannot be excluded.  Patient  notes that she wants to figure out the diagnosis ASAP and is very anxious. Reports no history of alcohol abuse or liver disease.  She has them out of colonoscopy but has had fecal occult blood testing recently which was negative. Last mammogram April 2017 with the normal limits. Last Pap smear August 2017 was within normal limits.   INTERVAL HISTORY  Wendy Avery is  here for her scheduled follow-up for her SMZL. The patient's last visit with [place] was on 08/18/2020.. The pt reports that she is doing well overall.   Patient notes she has been feeling well.  No fevers no chills no night sweats no unexpected weight loss.  No new toxicities from Rituxan.  No abdominal pain or distention.  No change in bowel habits.  Lab results today 10/19/2020 of CBC w/diff shows normal hemoglobin of 13.8 with mild thrombocytopenia of 141 k   WBC counts are within normal limits at 5.9k. LDH within normal limits at 170  On review of systems, pt reports no other acute new symptoms.     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 #3 right shoulder excision of basal cell carcinoma in 2005   SOCIAL HISTORY: Social History   Socioeconomic History   Marital status: Widowed    Spouse name: Not on file   Number of children: Not on file   Years of education: Not on file   Highest education level: Not on file  Occupational History   Not on file  Tobacco Use   Smoking status: Former    Types: Cigarettes    Quit date: 2010    Years since quitting: 12.7   Smokeless tobacco: Never  Vaping Use   Vaping Use: Never used  Substance and Sexual Activity   Alcohol use: Yes    Comment: occassional  Drug use: No   Sexual activity: Not on file  Other Topics Concern   Not on file  Social History Narrative   Not on file   Social Determinants of Health   Financial Resource Strain: Not on file  Food Insecurity: Not on file  Transportation Needs: Not on file  Physical Activity: Not on file  Stress: Not on file  Social Connections: Not on file  Intimate Partner Violence: Not on file  Ex-smoker 1 pack per day quit 30 years ago Uses alcohol socially She is widowed and has 2  daughters   FAMILY HISTORY: Mom had breast cancer at age 69 years and died from metastatic disease Father skin cancer, vocal cord cancer, alcohol abuse  ALLERGIES:  ?PCN  MEDICATIONS:  Current Outpatient Medications  Medication Sig Dispense Refill   LORazepam (ATIVAN) 0.5 MG tablet Take 1 tablet (0.5 mg total) by mouth every 8 (eight) hours as needed for anxiety. 30 tablet 0   No current facility-administered medications for this visit.    REVIEW OF SYSTEMS:   .10 Point review of Systems was done is negative except as noted above.   PHYSICAL EXAMINATION: ECOG FS:1 - Symptomatic but completely ambulatory  Vitals:   10/19/20 0904  BP: 134/61  Pulse: (!) 57  Resp: 17  Temp: 98 F (36.7 C)  SpO2: 100%    Wt Readings from Last 3 Encounters:  10/19/20 217 lb 6.4 oz (98.6 kg)  08/18/20 211 lb 12.8 oz (96.1 kg)  08/04/20 211 lb 8 oz (95.9 kg)   Body mass index is 41.08 kg/m.   Wendy Avery GENERAL:alert, in no acute distress and comfortable SKIN: no acute rashes, no significant lesions EYES: conjunctiva are pink and non-injected, sclera anicteric OROPHARYNX: MMM, no exudates, no oropharyngeal erythema or ulceration NECK: supple, no JVD LYMPH:  no palpable lymphadenopathy in the cervical, axillary or inguinal regions LUNGS: clear to auscultation b/l with normal respiratory effort HEART: regular rate & rhythm ABDOMEN:  normoactive bowel sounds , non tender, not distended. Extremity: no pedal edema PSYCH: alert & oriented x 3 with fluent speech NEURO: no focal motor/sensory deficits   LABORATORY DATA:  I have reviewed the data as listed  . CBC Latest Ref Rng & Units 10/19/2020 08/18/2020 08/11/2020  WBC 4.0 - 10.5 K/uL 5.9 5.5 7.0  Hemoglobin 12.0 - 15.0 g/dL 13.8 13.4 13.2  Hematocrit 36.0 - 46.0 % 41.1 40.2 39.6  Platelets 150 - 400 K/uL 141(L) 134(L) 137(L)   CBC    Component Value Date/Time   WBC 5.9 10/19/2020 0851   WBC 5.8 07/05/2020 0900   RBC 4.94 10/19/2020  0851   HGB 13.8 10/19/2020 0851   HGB 15.0 01/03/2017 1138   HCT 41.1 10/19/2020 0851   HCT 44.4 01/03/2017 1138   PLT 141 (L) 10/19/2020 0851   PLT 169 01/03/2017 1138   MCV 83.2 10/19/2020 0851   MCV 86.9 01/03/2017 1138   MCH 27.9 10/19/2020 0851   MCHC 33.6 10/19/2020 0851   RDW 14.6 10/19/2020 0851   RDW 12.9 01/03/2017 1138   LYMPHSABS 1.5 10/19/2020 0851   LYMPHSABS 1.8 01/03/2017 1138   MONOABS 0.5 10/19/2020 0851   MONOABS 0.4 01/03/2017 1138   EOSABS 0.2 10/19/2020 0851   EOSABS 0.2 01/03/2017 1138   BASOSABS 0.0 10/19/2020 0851   BASOSABS 0.0 01/03/2017 1138    . CMP Latest Ref Rng & Units 10/19/2020 08/18/2020 08/11/2020  Glucose 70 - 99 mg/dL 96 191(H) 110(H)  BUN 8 - 23 mg/dL  21 19 21   Creatinine 0.44 - 1.00 mg/dL 0.76 0.83 0.61  Sodium 135 - 145 mmol/L 143 141 139  Potassium 3.5 - 5.1 mmol/L 4.1 3.8 4.0  Chloride 98 - 111 mmol/L 108 107 107  CO2 22 - 32 mmol/L 23 24 26   Calcium 8.9 - 10.3 mg/dL 9.2 9.6 9.1  Total Protein 6.5 - 8.1 g/dL 6.3(L) 6.3(L) 6.2(L)  Total Bilirubin 0.3 - 1.2 mg/dL 0.8 1.1 1.1  Alkaline Phos 38 - 126 U/L 79 80 68  AST 15 - 41 U/L 22 15 20   ALT 0 - 44 U/L 31 26 24     Lab Results  Component Value Date   LDH 170 10/19/2020    ...   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   PERIPHERAL BLOOD:  - Abnormal lymphocytes present.  - Mild thrombocytopenia.   COMMENT:   The marrow is normocellular but  exhibits a few small abnormal lymphoid  aggregates. Flow cytometry reveals a monoclonal B-cell population with a  similar phenotype (no CD5 or CD10 expression) to that of the patient's  prior lymphoma. The overall findings are consistent with mild  involvement by the patient's prior non-Hodgkin B-cell lymphoma (splenic  marginal zone lymphoma). While there is no absolute lymphocytosis, there  are abnormal lymphocytes which likely represent peripheral blood  involvement.   07/05/2020 Cytogenetics Report    RADIOGRAPHIC STUDIES: ABDOMEN ULTRASOUND COMPLETE   COMPARISON:  CT 08/03/2016   FINDINGS: Gallbladder: No gallstones or wall thickening visualized. No sonographic Murphy sign noted by sonographer.   Common bile duct: Diameter: 1.8 mm.   Liver: No focal lesion identified. Within normal limits in parenchymal echogenicity. Portal vein is patent on color Doppler imaging with normal direction of blood flow towards the liver.   IVC: No abnormality visualized.   Pancreas: Visualized portion unremarkable.   Spleen: Mild to moderate splenomegaly as the spleen measures 12.9 cm in greatest diameter, although splenic volume is increased measuring 785 cubic cm.   Right Kidney: Length: 10.1 cm. Echogenicity within normal limits. No mass or hydronephrosis visualized.   Left Kidney: Length: 10.8 cm. Echogenicity within normal limits. No mass or hydronephrosis visualized.   Abdominal aorta: No aneurysm visualized.   Other findings: None.   IMPRESSION: No acute findings.   Known mild to moderate splenomegaly with splenic volume 785 cubic cm.     Electronically Signed   By: Marin Olp M.D.   On: 11/30/2016 10:09   ASSESSMENT & PLAN:   65 y.o. 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 Diffusely  hypermetabolic on PET/CT No evidence of liver disease on CT scan. No evidence of portal hypertension. MPN w/u demonstrated no evidence of genetics mutations to suggest MPN  [place] abd 11/30/2016 - spleen now down to 12.9 cm post Rituxan treatment  #2 Mild thrombocytopenia platelets of 107k. 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 Platelets are improved from 99k to 134k  #3 Indetermine left renal lesion 1.5 cms in size ?complex cyst - will need monitoring. No reports of this on [place] ab in 11/2016.  #4 Severe anxiety- improved since initial treatment.   PLAN: -Discussed pt labwork today, 10/19/2020 reviewed with the patient as noted above. -LDH within normal limits --Patient has no significant toxicities with Rituxan and labs are stable- she is good to proceed with next cycle of maintenance Rituxan. -We discussed and patient is agreeable to proceeding with maintenance Rituxan for 1 to 2 years to try to get deeper response with her relapsed splenic marginal zone lymphoma -Is up-to-date with her COVID vaccination and has received her first dose of Evusheld on 07/28/2020  FOLLOW UP: Please schedule next 2 cycles of maintenance Rituxan every 2 months with labs and MD visits  . The total time spent in the appointment was 30 minutes and more than 50% was on counseling and direct patient cares, ordering and mx of Rituxan.  All of the patient's questions were answered with apparent satisfaction. The patient knows to call the clinic with any problems, questions or concerns.   Sullivan Lone MD [place] AAHIVMS [facility] Hematology/Oncology Physician [facility]

## 2020-11-17 ENCOUNTER — Inpatient Hospital Stay: Payer: BC Managed Care – PPO | Attending: Hematology | Admitting: Dietician

## 2020-11-17 ENCOUNTER — Other Ambulatory Visit: Payer: Self-pay

## 2020-11-21 DIAGNOSIS — H6983 Other specified disorders of Eustachian tube, bilateral: Secondary | ICD-10-CM | POA: Diagnosis not present

## 2020-11-21 DIAGNOSIS — Z03818 Encounter for observation for suspected exposure to other biological agents ruled out: Secondary | ICD-10-CM | POA: Diagnosis not present

## 2020-11-21 DIAGNOSIS — R509 Fever, unspecified: Secondary | ICD-10-CM | POA: Diagnosis not present

## 2020-11-21 DIAGNOSIS — B349 Viral infection, unspecified: Secondary | ICD-10-CM | POA: Diagnosis not present

## 2020-11-21 DIAGNOSIS — R5383 Other fatigue: Secondary | ICD-10-CM | POA: Diagnosis not present

## 2020-12-05 DIAGNOSIS — R42 Dizziness and giddiness: Secondary | ICD-10-CM | POA: Diagnosis not present

## 2020-12-08 DIAGNOSIS — D225 Melanocytic nevi of trunk: Secondary | ICD-10-CM | POA: Diagnosis not present

## 2020-12-08 DIAGNOSIS — L821 Other seborrheic keratosis: Secondary | ICD-10-CM | POA: Diagnosis not present

## 2020-12-08 DIAGNOSIS — Z85828 Personal history of other malignant neoplasm of skin: Secondary | ICD-10-CM | POA: Diagnosis not present

## 2020-12-08 DIAGNOSIS — D485 Neoplasm of uncertain behavior of skin: Secondary | ICD-10-CM | POA: Diagnosis not present

## 2020-12-08 DIAGNOSIS — L814 Other melanin hyperpigmentation: Secondary | ICD-10-CM | POA: Diagnosis not present

## 2020-12-08 DIAGNOSIS — D235 Other benign neoplasm of skin of trunk: Secondary | ICD-10-CM | POA: Diagnosis not present

## 2020-12-08 DIAGNOSIS — L570 Actinic keratosis: Secondary | ICD-10-CM | POA: Diagnosis not present

## 2020-12-14 ENCOUNTER — Inpatient Hospital Stay (HOSPITAL_BASED_OUTPATIENT_CLINIC_OR_DEPARTMENT_OTHER): Payer: BC Managed Care – PPO | Admitting: Hematology

## 2020-12-14 ENCOUNTER — Other Ambulatory Visit: Payer: Self-pay

## 2020-12-14 ENCOUNTER — Inpatient Hospital Stay: Payer: BC Managed Care – PPO | Attending: Hematology

## 2020-12-14 ENCOUNTER — Inpatient Hospital Stay: Payer: BC Managed Care – PPO

## 2020-12-14 VITALS — BP 143/63 | HR 65 | Temp 97.5°F | Resp 18 | Wt 219.4 lb

## 2020-12-14 VITALS — BP 134/55 | HR 73 | Temp 98.0°F | Resp 16

## 2020-12-14 DIAGNOSIS — F419 Anxiety disorder, unspecified: Secondary | ICD-10-CM | POA: Insufficient documentation

## 2020-12-14 DIAGNOSIS — Z87891 Personal history of nicotine dependence: Secondary | ICD-10-CM | POA: Insufficient documentation

## 2020-12-14 DIAGNOSIS — Z79899 Other long term (current) drug therapy: Secondary | ICD-10-CM | POA: Insufficient documentation

## 2020-12-14 DIAGNOSIS — K581 Irritable bowel syndrome with constipation: Secondary | ICD-10-CM | POA: Diagnosis not present

## 2020-12-14 DIAGNOSIS — C8307 Small cell B-cell lymphoma, spleen: Secondary | ICD-10-CM | POA: Diagnosis not present

## 2020-12-14 DIAGNOSIS — Z7189 Other specified counseling: Secondary | ICD-10-CM

## 2020-12-14 DIAGNOSIS — Z5112 Encounter for antineoplastic immunotherapy: Secondary | ICD-10-CM

## 2020-12-14 DIAGNOSIS — D696 Thrombocytopenia, unspecified: Secondary | ICD-10-CM | POA: Insufficient documentation

## 2020-12-14 LAB — CBC WITH DIFFERENTIAL (CANCER CENTER ONLY)
Abs Immature Granulocytes: 0.03 10*3/uL (ref 0.00–0.07)
Basophils Absolute: 0 10*3/uL (ref 0.0–0.1)
Basophils Relative: 0 %
Eosinophils Absolute: 0.3 10*3/uL (ref 0.0–0.5)
Eosinophils Relative: 5 %
HCT: 40.8 % (ref 36.0–46.0)
Hemoglobin: 13.6 g/dL (ref 12.0–15.0)
Immature Granulocytes: 0 %
Lymphocytes Relative: 22 %
Lymphs Abs: 1.5 10*3/uL (ref 0.7–4.0)
MCH: 28.3 pg (ref 26.0–34.0)
MCHC: 33.3 g/dL (ref 30.0–36.0)
MCV: 84.8 fL (ref 80.0–100.0)
Monocytes Absolute: 0.6 10*3/uL (ref 0.1–1.0)
Monocytes Relative: 9 %
Neutro Abs: 4.2 10*3/uL (ref 1.7–7.7)
Neutrophils Relative %: 64 %
Platelet Count: 150 10*3/uL (ref 150–400)
RBC: 4.81 MIL/uL (ref 3.87–5.11)
RDW: 12.8 % (ref 11.5–15.5)
WBC Count: 6.7 10*3/uL (ref 4.0–10.5)
nRBC: 0 % (ref 0.0–0.2)

## 2020-12-14 LAB — CMP (CANCER CENTER ONLY)
ALT: 50 U/L — ABNORMAL HIGH (ref 0–44)
AST: 33 U/L (ref 15–41)
Albumin: 3.9 g/dL (ref 3.5–5.0)
Alkaline Phosphatase: 85 U/L (ref 38–126)
Anion gap: 7 (ref 5–15)
BUN: 18 mg/dL (ref 8–23)
CO2: 25 mmol/L (ref 22–32)
Calcium: 8.9 mg/dL (ref 8.9–10.3)
Chloride: 110 mmol/L (ref 98–111)
Creatinine: 0.76 mg/dL (ref 0.44–1.00)
GFR, Estimated: >60 mL/min (ref >60)
Glucose, Bld: 91 mg/dL (ref 70–99)
Potassium: 4.1 mmol/L (ref 3.5–5.1)
Sodium: 142 mmol/L (ref 135–145)
Total Bilirubin: 0.6 mg/dL (ref 0.3–1.2)
Total Protein: 6.3 g/dL — ABNORMAL LOW (ref 6.5–8.1)

## 2020-12-14 LAB — LACTATE DEHYDROGENASE: LDH: 161 U/L (ref 98–192)

## 2020-12-14 MED ORDER — LORAZEPAM 1 MG PO TABS
0.5000 mg | ORAL_TABLET | Freq: Once | ORAL | Status: AC
  Administered 2020-12-14: 0.5 mg via ORAL
  Filled 2020-12-14: qty 1

## 2020-12-14 MED ORDER — ACETAMINOPHEN 325 MG PO TABS
650.0000 mg | ORAL_TABLET | Freq: Once | ORAL | Status: AC
  Administered 2020-12-14: 650 mg via ORAL
  Filled 2020-12-14: qty 2

## 2020-12-14 MED ORDER — SODIUM CHLORIDE 0.9 % IV SOLN
375.0000 mg/m2 | Freq: Once | INTRAVENOUS | Status: AC
  Administered 2020-12-14: 800 mg via INTRAVENOUS
  Filled 2020-12-14: qty 50

## 2020-12-14 MED ORDER — DIPHENHYDRAMINE HCL 25 MG PO CAPS
50.0000 mg | ORAL_CAPSULE | Freq: Once | ORAL | Status: AC
  Administered 2020-12-14: 50 mg via ORAL
  Filled 2020-12-14: qty 2

## 2020-12-14 MED ORDER — SODIUM CHLORIDE 0.9 % IV SOLN
Freq: Once | INTRAVENOUS | Status: AC
Start: 2020-12-14 — End: 2020-12-14

## 2020-12-14 MED ORDER — METHYLPREDNISOLONE SODIUM SUCC 125 MG IJ SOLR
125.0000 mg | Freq: Once | INTRAMUSCULAR | Status: AC
  Administered 2020-12-14: 125 mg via INTRAVENOUS
  Filled 2020-12-14: qty 2

## 2020-12-14 MED ORDER — FAMOTIDINE 20 MG IN NS 100 ML IVPB
20.0000 mg | Freq: Once | INTRAVENOUS | Status: AC
  Administered 2020-12-14: 20 mg via INTRAVENOUS
  Filled 2020-12-14: qty 100

## 2020-12-14 NOTE — Patient Instructions (Signed)
[facility] ONCOLOGY   Discharge Instructions: Thank you for choosing [facility] to provide your oncology and hematology care.   If you have a lab appointment with the [facility], please go directly to the [facility] and check in at the registration area.   Wear comfortable clothing and clothing appropriate for easy access to any Portacath or PICC line.   We strive to give you quality time with your provider. You may need to reschedule your appointment if you arrive late (15 or more minutes).  Arriving late affects you and other patients whose appointments are after yours.  Also, if you miss three or more appointments without notifying the office, you may be dismissed from the clinic at the provider's discretion.      For prescription refill requests, have your pharmacy contact our office and allow 72 hours for refills to be completed.    Today you received the following chemotherapy and/or immunotherapy agents: rituximab-pvvr.      To help prevent nausea and vomiting after your treatment, we encourage you to take your nausea medication as directed.  BELOW ARE SYMPTOMS THAT SHOULD BE REPORTED IMMEDIATELY: *FEVER GREATER THAN 100.4 F (38 C) OR HIGHER *CHILLS OR SWEATING *NAUSEA AND VOMITING THAT IS NOT CONTROLLED WITH YOUR NAUSEA MEDICATION *UNUSUAL SHORTNESS OF BREATH *UNUSUAL BRUISING OR BLEEDING *URINARY PROBLEMS (pain or burning when urinating, or frequent urination) *BOWEL PROBLEMS (unusual diarrhea, constipation, pain near the anus) TENDERNESS IN MOUTH AND THROAT WITH OR WITHOUT PRESENCE OF ULCERS (sore throat, sores in mouth, or a toothache) UNUSUAL RASH, SWELLING OR PAIN  UNUSUAL VAGINAL DISCHARGE OR ITCHING   Items with * indicate a potential emergency and should be followed up as soon as possible or go to the Emergency Department if any problems should occur.  Please show the CHEMOTHERAPY ALERT CARD or IMMUNOTHERAPY ALERT CARD at  check-in to the Emergency Department and triage nurse.  Should you have questions after your visit or need to cancel or reschedule your appointment, please contact [facility]  Dept: [phone]  and follow the prompts.  Office hours are 8:00 a.m. to 4:30 p.m. Monday - Friday. Please note that voicemails left after 4:00 p.m. may not be returned until the following business day.  We are closed weekends and major holidays. You have access to a nurse at all times for urgent questions. Please call the main number to the clinic Dept: [phone] and follow the prompts.   For any non-urgent questions, you may also contact your provider using MyChart. We now offer e-Visits for anyone 69 and older to request care online for non-urgent symptoms. For details visit [email or web address].   Also download the MyChart app! Go to the app store, search "MyChart", open the app, select [facility], and log in with your MyChart username and password.  Due to Covid, a mask is required upon entering the hospital/clinic. If you do not have a mask, one will be given to you upon arrival. For doctor visits, patients may have 1 support person aged 58 or older with them. For treatment visits, patients cannot have anyone with them due to current Covid guidelines and our immunocompromised population.

## 2020-12-20 ENCOUNTER — Encounter: Payer: Self-pay | Admitting: Hematology

## 2020-12-20 NOTE — Progress Notes (Signed)
[name]    HEMATOLOGY/ONCOLOGY CLINIC NOTE  Date of Service: .12/14/2020   Patient Care Team: [name], MD as PCP - General (Family Medicine)  PCP: [name] MD   CHIEF COMPLAINTS/PURPOSE OF CONSULTATION:  F/u for splenic marginal zone lymphoma  HISTORY OF PRESENTING ILLNESS:   Wendy Avery is a wonderful 65 y.o. female who has been referred to [place] by Dr .[name], MD  for evaluation and management of possible lymphoma in the setting of massive splenomegaly and abdominal lymphadenopathy.  Patient patient has no significant chronic medical issues. She presented to urgent care with abdominal bloating and distention for 1-2 months. Also had some abdominal discomfort in the left upper quadrant of the abdomen and notes that she could feel a hard mass. She also notes some aches in her chest for 1-2 months and significant progressive fatigue which is starting to affect her quality of life. Over the last month she also notes some drenching night sweats but isn't sure if this is related to menopausal symptoms. Denies any overt weight loss. Patient has history of anxiety and notes severe anxiety over the symptoms.  Patient had a CT of the abdomen and pelvis with contrast on 08/03/2016 to further evaluate her symptoms. This showed massive splenomegaly with the spleen measuring 23.0 x 20.2 x 11.6 cm (volume = 2820 cm^3). Also noted to have concurrent  mild abdominal adenopathy. Incidental findings of Indeterminate left renal lesion. Most likely a minimally complex cyst. A solid neoplasm cannot be excluded.  Patient  notes that she wants to figure out the diagnosis ASAP and is very anxious. Reports no history of alcohol abuse or liver disease.  She has them out of colonoscopy but has had fecal occult blood testing recently which was negative. Last mammogram April 2017 with the normal limits. Last Pap smear August 2017 was within normal limits.   INTERVAL HISTORY  Wendy Avery is  here for her scheduled follow-up for her SMZL. The patient's last visit with [place] was on 10/19/2020.. The pt reports that she is doing well overall.   Patient notes she has been feeling well.  No fevers no chills no night sweats no unexpected weight loss.  No new toxicities from Rituxan.  No abdominal pain or distention.  No change in bowel habits.  No new infection issues.  No new lumps or bumps.  Lab results today 12/14/2020 CBC within normal limits, CMP within normal limits other than ALT of 50 LDH 161  On review of system patient notes no other acute new symptoms.   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 #3 right shoulder excision of basal cell carcinoma in 2005   SOCIAL HISTORY: Social History   Socioeconomic History   Marital status: Widowed    Spouse name: Not on file   Number of children: Not on file   Years of education: Not on file   Highest education level: Not on file  Occupational History   Not on file  Tobacco Use   Smoking status: Former    Types: Cigarettes    Quit date: 2010    Years since quitting: 12.9   Smokeless tobacco: Never  Vaping Use   Vaping Use: Never used  Substance and Sexual Activity   Alcohol use: Yes    Comment: occassional    Drug use: No  Sexual activity: Not on file  Other Topics Concern   Not on file  Social History Narrative   Not on file   Social Determinants of Health   Financial Resource Strain: Not on file  Food Insecurity: Not on file  Transportation Needs: Not on file  Physical Activity: Not on file  Stress: Not on file  Social Connections: Not on file  Intimate Partner Violence: Not on file  Ex-smoker 1 pack per day quit 30 years ago Uses alcohol socially She is widowed and has 2 daughters   FAMILY HISTORY: Mom had breast  cancer at age 54 years and died from metastatic disease Father skin cancer, vocal cord cancer, alcohol abuse  ALLERGIES:  ?PCN  MEDICATIONS:  Current Outpatient Medications  Medication Sig Dispense Refill   LORazepam (ATIVAN) 0.5 MG tablet Take 1 tablet (0.5 mg total) by mouth every 8 (eight) hours as needed for anxiety. 30 tablet 0   No current facility-administered medications for this visit.    REVIEW OF SYSTEMS:   .10 Point review of Systems was done is negative except as noted above.    PHYSICAL EXAMINATION: ECOG FS:1 - Symptomatic but completely ambulatory  Vitals:   12/14/20 0908  BP: (!) 143/63  Pulse: 65  Resp: 18  Temp: (!) 97.5 F (36.4 C)  SpO2: 99%    Wt Readings from Last 3 Encounters:  12/14/20 219 lb 6.4 oz (99.5 kg)  10/19/20 217 lb 6.4 oz (98.6 kg)  08/18/20 211 lb 12.8 oz (96.1 kg)   Body mass index is 41.46 kg/m.   [name] GENERAL:alert, in no acute distress and comfortable SKIN: no acute rashes, no significant lesions EYES: conjunctiva are pink and non-injected, sclera anicteric OROPHARYNX: MMM, no exudates, no oropharyngeal erythema or ulceration NECK: supple, no JVD LYMPH:  no palpable lymphadenopathy in the cervical, axillary or inguinal regions LUNGS: clear to auscultation b/l with normal respiratory effort HEART: regular rate & rhythm ABDOMEN:  normoactive bowel sounds , non tender, not distended.  No palpable hepatosplenomegaly Extremity: no pedal edema PSYCH: alert & oriented x 3 with fluent speech NEURO: no focal motor/sensory deficits    LABORATORY DATA:  I have reviewed the data as listed  . CBC Latest Ref Rng & Units 12/14/2020 10/19/2020 08/18/2020  WBC 4.0 - 10.5 K/uL 6.7 5.9 5.5  Hemoglobin 12.0 - 15.0 g/dL 13.6 13.8 13.4  Hematocrit 36.0 - 46.0 % 40.8 41.1 40.2  Platelets 150 - 400 K/uL 150 141(L) 134(L)   CBC    Component Value Date/Time   WBC 6.7 12/14/2020 0850   WBC 5.8 07/05/2020 0900   RBC 4.81 12/14/2020 0850    HGB 13.6 12/14/2020 0850   HGB 15.0 01/03/2017 1138   HCT 40.8 12/14/2020 0850   HCT 44.4 01/03/2017 1138   PLT 150 12/14/2020 0850   PLT 169 01/03/2017 1138   MCV 84.8 12/14/2020 0850   MCV 86.9 01/03/2017 1138   MCH 28.3 12/14/2020 0850   MCHC 33.3 12/14/2020 0850   RDW 12.8 12/14/2020 0850   RDW 12.9 01/03/2017 1138   LYMPHSABS 1.5 12/14/2020 0850   LYMPHSABS 1.8 01/03/2017 1138   MONOABS 0.6 12/14/2020 0850   MONOABS 0.4 01/03/2017 1138   EOSABS 0.3 12/14/2020 0850   EOSABS 0.2 01/03/2017 1138   BASOSABS 0.0 12/14/2020 0850   BASOSABS 0.0 01/03/2017 1138    . CMP Latest Ref Rng & Units 12/14/2020 10/19/2020 08/18/2020  Glucose 70 - 99 mg/dL 91 96 191(H)  BUN 8 - 23  mg/dL 18 21 19   Creatinine 0.44 - 1.00 mg/dL 0.76 0.76 0.83  Sodium 135 - 145 mmol/L 142 143 141  Potassium 3.5 - 5.1 mmol/L 4.1 4.1 3.8  Chloride 98 - 111 mmol/L 110 108 107  CO2 22 - 32 mmol/L 25 23 24   Calcium 8.9 - 10.3 mg/dL 8.9 9.2 9.6  Total Protein 6.5 - 8.1 g/dL 6.3(L) 6.3(L) 6.3(L)  Total Bilirubin 0.3 - 1.2 mg/dL 0.6 0.8 1.1  Alkaline Phos 38 - 126 U/L 85 79 80  AST 15 - 41 U/L 33 22 15  ALT 0 - 44 U/L 50(H) 31 26    Lab Results  Component Value Date   LDH 161 12/14/2020    ...   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   PERIPHERAL BLOOD:  - Abnormal lymphocytes present.  - Mild thrombocytopenia.   COMMENT:   The marrow is normocellular but exhibits a  few small abnormal lymphoid  aggregates. Flow cytometry reveals a monoclonal B-cell population with a  similar phenotype (no CD5 or CD10 expression) to that of the patient's  prior lymphoma. The overall findings are consistent with mild  involvement by the patient's prior non-Hodgkin B-cell lymphoma (splenic  marginal zone lymphoma). While there is no absolute lymphocytosis, there  are abnormal lymphocytes which likely represent peripheral blood  involvement.   07/05/2020 Cytogenetics Report    RADIOGRAPHIC STUDIES: ABDOMEN ULTRASOUND COMPLETE   COMPARISON:  CT 08/03/2016   FINDINGS: Gallbladder: No gallstones or wall thickening visualized. No sonographic Murphy sign noted by sonographer.   Common bile duct: Diameter: 1.8 mm.   Liver: No focal lesion identified. Within normal limits in parenchymal echogenicity. Portal vein is patent on color Doppler imaging with normal direction of blood flow towards the liver.   IVC: No abnormality visualized.   Pancreas: Visualized portion unremarkable.   Spleen: Mild to moderate splenomegaly as the spleen measures 12.9 cm in greatest diameter, although splenic volume is increased measuring 785 cubic cm.   Right Kidney: Length: 10.1 cm. Echogenicity within normal limits. No mass or hydronephrosis visualized.   Left Kidney: Length: 10.8 cm. Echogenicity within normal limits. No mass or hydronephrosis visualized.   Abdominal aorta: No aneurysm visualized.   Other findings: None.   IMPRESSION: No acute findings.   Known mild to moderate splenomegaly with splenic volume 785 cubic cm.     Electronically Signed   By: Marin Olp M.D.   On: 11/30/2016 10:09   ASSESSMENT & PLAN:   65 y.o. 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 Diffusely hypermetabolic on  PET/CT No evidence of liver disease on CT scan. No evidence of portal hypertension. MPN w/u demonstrated no evidence of genetics mutations to suggest MPN  [place] abd 11/30/2016 - spleen now down to 12.9 cm post Rituxan treatment  #2 Mild thrombocytopenia platelets of 107k. 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 Platelets are improved from 99k to 134k  #3 Indetermine left renal lesion 1.5 cms in size ?complex cyst - will need monitoring. No reports of this on [place] ab in 11/2016.  #4 Severe anxiety- improved since initial treatment.   PLAN: -Discussed pt labwork today, 12/14/2020 reviewed with the patient as noted above. -LDH within normal limits --Patient has no significant toxicities with Rituxan and labs are stable- she is good to proceed with next cycle of maintenance Rituxan with same supportive medications. -Plan is to continue and complete maintenance Rituxan for 2 years to try to get deeper response with her relapsed splenic marginal zone lymphoma -Is up-to-date with her COVID vaccination and has received her first dose of Evusheld on 07/28/2020 Rituxan orders were placed and signed  FOLLOW UP: Please schedule next 2 cycles of maintenance Rituxan every 2 months with labs and MD visits  . The total time spent in the appointment was 30 minutes and more than 50% was on counseling and direct patient cares.    All of the patient's questions were answered with apparent satisfaction. The patient knows to call the clinic with any problems, questions or concerns.   Sullivan Lone MD [place] AAHIVMS [facility] Hematology/Oncology Physician [facility], [facility], [facility]

## 2021-02-01 DIAGNOSIS — C8580 Other specified types of non-Hodgkin lymphoma, unspecified site: Secondary | ICD-10-CM | POA: Diagnosis not present

## 2021-02-01 DIAGNOSIS — R42 Dizziness and giddiness: Secondary | ICD-10-CM | POA: Diagnosis not present

## 2021-02-15 ENCOUNTER — Inpatient Hospital Stay: Payer: BC Managed Care – PPO

## 2021-02-15 ENCOUNTER — Inpatient Hospital Stay (HOSPITAL_BASED_OUTPATIENT_CLINIC_OR_DEPARTMENT_OTHER): Payer: BC Managed Care – PPO | Admitting: Hematology

## 2021-02-15 ENCOUNTER — Other Ambulatory Visit: Payer: Self-pay

## 2021-02-15 ENCOUNTER — Inpatient Hospital Stay: Payer: BC Managed Care – PPO | Attending: Hematology

## 2021-02-15 VITALS — BP 123/61 | HR 55 | Temp 98.1°F | Resp 18

## 2021-02-15 VITALS — BP 134/60 | HR 63 | Temp 97.8°F | Resp 17 | Ht 61.0 in | Wt 223.9 lb

## 2021-02-15 DIAGNOSIS — C8307 Small cell B-cell lymphoma, spleen: Secondary | ICD-10-CM

## 2021-02-15 DIAGNOSIS — Z5112 Encounter for antineoplastic immunotherapy: Secondary | ICD-10-CM

## 2021-02-15 DIAGNOSIS — Z79899 Other long term (current) drug therapy: Secondary | ICD-10-CM | POA: Insufficient documentation

## 2021-02-15 DIAGNOSIS — Z7189 Other specified counseling: Secondary | ICD-10-CM

## 2021-02-15 LAB — CBC WITH DIFFERENTIAL (CANCER CENTER ONLY)
Abs Immature Granulocytes: 0.01 10*3/uL (ref 0.00–0.07)
Basophils Absolute: 0 10*3/uL (ref 0.0–0.1)
Basophils Relative: 0 %
Eosinophils Absolute: 0.3 10*3/uL (ref 0.0–0.5)
Eosinophils Relative: 4 %
HCT: 41.5 % (ref 36.0–46.0)
Hemoglobin: 14 g/dL (ref 12.0–15.0)
Immature Granulocytes: 0 %
Lymphocytes Relative: 20 %
Lymphs Abs: 1.4 10*3/uL (ref 0.7–4.0)
MCH: 28.7 pg (ref 26.0–34.0)
MCHC: 33.7 g/dL (ref 30.0–36.0)
MCV: 85 fL (ref 80.0–100.0)
Monocytes Absolute: 0.6 10*3/uL (ref 0.1–1.0)
Monocytes Relative: 8 %
Neutro Abs: 4.6 10*3/uL (ref 1.7–7.7)
Neutrophils Relative %: 68 %
Platelet Count: 155 10*3/uL (ref 150–400)
RBC: 4.88 MIL/uL (ref 3.87–5.11)
RDW: 12.9 % (ref 11.5–15.5)
WBC Count: 6.9 10*3/uL (ref 4.0–10.5)
nRBC: 0 % (ref 0.0–0.2)

## 2021-02-15 LAB — CMP (CANCER CENTER ONLY)
ALT: 25 U/L (ref 0–44)
AST: 16 U/L (ref 15–41)
Albumin: 4.1 g/dL (ref 3.5–5.0)
Alkaline Phosphatase: 79 U/L (ref 38–126)
Anion gap: 7 (ref 5–15)
BUN: 19 mg/dL (ref 8–23)
CO2: 26 mmol/L (ref 22–32)
Calcium: 9.3 mg/dL (ref 8.9–10.3)
Chloride: 107 mmol/L (ref 98–111)
Creatinine: 0.75 mg/dL (ref 0.44–1.00)
GFR, Estimated: >60 mL/min (ref >60)
Glucose, Bld: 106 mg/dL — ABNORMAL HIGH (ref 70–99)
Potassium: 4.1 mmol/L (ref 3.5–5.1)
Sodium: 140 mmol/L (ref 135–145)
Total Bilirubin: 0.8 mg/dL (ref 0.3–1.2)
Total Protein: 6.4 g/dL — ABNORMAL LOW (ref 6.5–8.1)

## 2021-02-15 LAB — LACTATE DEHYDROGENASE: LDH: 144 U/L (ref 98–192)

## 2021-02-15 MED ORDER — SODIUM CHLORIDE 0.9 % IV SOLN: Freq: Once | INTRAVENOUS | Status: AC

## 2021-02-15 MED ORDER — LORAZEPAM 1 MG PO TABS
0.5000 mg | ORAL_TABLET | Freq: Once | ORAL | Status: AC
  Administered 2021-02-15: 11:00:00 0.5 mg via ORAL
  Filled 2021-02-15: qty 1

## 2021-02-15 MED ORDER — SODIUM CHLORIDE 0.9 % IV SOLN
375.0000 mg/m2 | Freq: Once | INTRAVENOUS | Status: AC
  Administered 2021-02-15: 12:00:00 800 mg via INTRAVENOUS
  Filled 2021-02-15: qty 50

## 2021-02-15 MED ORDER — METHYLPREDNISOLONE SODIUM SUCC 125 MG IJ SOLR
125.0000 mg | Freq: Once | INTRAMUSCULAR | Status: AC
  Administered 2021-02-15: 11:00:00 125 mg via INTRAVENOUS
  Filled 2021-02-15: qty 2

## 2021-02-15 MED ORDER — ACETAMINOPHEN 325 MG PO TABS
650.0000 mg | ORAL_TABLET | Freq: Once | ORAL | Status: AC
  Administered 2021-02-15: 11:00:00 650 mg via ORAL
  Filled 2021-02-15: qty 2

## 2021-02-15 MED ORDER — FAMOTIDINE 20 MG IN NS 100 ML IVPB
20.0000 mg | Freq: Once | INTRAVENOUS | Status: AC
  Administered 2021-02-15: 11:00:00 20 mg via INTRAVENOUS
  Filled 2021-02-15: qty 100

## 2021-02-15 MED ORDER — DIPHENHYDRAMINE HCL 25 MG PO CAPS
50.0000 mg | ORAL_CAPSULE | Freq: Once | ORAL | Status: AC
  Administered 2021-02-15: 11:00:00 50 mg via ORAL
  Filled 2021-02-15: qty 2

## 2021-02-15 NOTE — Patient Instructions (Signed)
[facility] ONCOLOGY   Discharge Instructions: Thank you for choosing [facility] to provide your oncology and hematology care.   If you have a lab appointment with the [facility], please go directly to the [facility] and check in at the registration area.   Wear comfortable clothing and clothing appropriate for easy access to any Portacath or PICC line.   We strive to give you quality time with your provider. You may need to reschedule your appointment if you arrive late (15 or more minutes).  Arriving late affects you and other patients whose appointments are after yours.  Also, if you miss three or more appointments without notifying the office, you may be dismissed from the clinic at the providers discretion.      For prescription refill requests, have your pharmacy contact our office and allow 72 hours for refills to be completed.    Today you received the following chemotherapy and/or immunotherapy agents: rituximab-pvvr      To help prevent nausea and vomiting after your treatment, we encourage you to take your nausea medication as directed.  BELOW ARE SYMPTOMS THAT SHOULD BE REPORTED IMMEDIATELY: *FEVER GREATER THAN 100.4 F (38 C) OR HIGHER *CHILLS OR SWEATING *NAUSEA AND VOMITING THAT IS NOT CONTROLLED WITH YOUR NAUSEA MEDICATION *UNUSUAL SHORTNESS OF BREATH *UNUSUAL BRUISING OR BLEEDING *URINARY PROBLEMS (pain or burning when urinating, or frequent urination) *BOWEL PROBLEMS (unusual diarrhea, constipation, pain near the anus) TENDERNESS IN MOUTH AND THROAT WITH OR WITHOUT PRESENCE OF ULCERS (sore throat, sores in mouth, or a toothache) UNUSUAL RASH, SWELLING OR PAIN  UNUSUAL VAGINAL DISCHARGE OR ITCHING   Items with * indicate a potential emergency and should be followed up as soon as possible or go to the Emergency Department if any problems should occur.  Please show the CHEMOTHERAPY ALERT CARD or IMMUNOTHERAPY ALERT CARD at  check-in to the Emergency Department and triage nurse.  Should you have questions after your visit or need to cancel or reschedule your appointment, please contact [facility]  Dept: [phone]  and follow the prompts.  Office hours are 8:00 a.m. to 4:30 p.m. Monday - Friday. Please note that voicemails left after 4:00 p.m. may not be returned until the following business day.  We are closed weekends and major holidays. You have access to a nurse at all times for urgent questions. Please call the main number to the clinic Dept: [phone] and follow the prompts.   For any non-urgent questions, you may also contact your provider using MyChart. We now offer e-Visits for anyone 69 and older to request care online for non-urgent symptoms. For details visit [email or web address].   Also download the MyChart app! Go to the app store, search "MyChart", open the app, select [facility], and log in with your MyChart username and password.  Due to Covid, a mask is required upon entering the hospital/clinic. If you do not have a mask, one will be given to you upon arrival. For doctor visits, patients may have 1 support person aged 23 or older with them. For treatment visits, patients cannot have anyone with them due to current Covid guidelines and our immunocompromised population.

## 2021-02-21 ENCOUNTER — Encounter: Payer: Self-pay | Admitting: Hematology

## 2021-02-21 NOTE — Progress Notes (Addendum)
[name]    HEMATOLOGY/ONCOLOGY CLINIC NOTE  Date of Service: .02/15/2021   Patient Care Team: [name], MD as PCP - General (Family Medicine)  PCP: [name] MD   CHIEF COMPLAINTS/PURPOSE OF CONSULTATION: Follow-up for continued management of relapsed splenic marginal zone lymphoma.  HISTORY OF PRESENTING ILLNESS:   Please see previous note for details on initial presentation  INTERVAL HISTORY  Wendy Avery is here for her scheduled follow-up for continued evaluation and management of her relapsed splenic marginal zone lymphoma. She notes no new toxicities from her previous dose of maintenance Rituxan and is here for her next dose. No infection issues. No fevers no chills no night sweats no unexpected new weight loss. No abdominal pain or distention. Has gained about 7 pounds since September last year.  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 #3 Right Shoulder excision of basal cell carcinoma in 2005   SOCIAL HISTORY: Social History   Socioeconomic History   Marital status: Widowed    Spouse name: Not on file   Number of children: Not on file   Years of education: Not on file   Highest education level: Not on file  Occupational History   Not on file  Tobacco Use   Smoking status: Former    Types: Cigarettes    Quit date: 2010    Years since quitting: 13.1   Smokeless tobacco: Never  Vaping Use   Vaping Use: Never used  Substance and Sexual Activity   Alcohol use: Yes    Comment: occassional    Drug use: No   Sexual activity: Not on file  Other Topics Concern   Not on file  Social History Narrative   Not on file   Social Determinants of Health   Financial Resource Strain: Not on file  Food Insecurity: Not on file  Transportation Needs: Not on file  Physical  Activity: Not on file  Stress: Not on file  Social Connections: Not on file  Intimate Partner Violence: Not on file  Ex-smoker 1 pack per day quit 30 years ago Uses alcohol socially She is widowed and has 2 daughters   FAMILY HISTORY: Mom had breast cancer at age 94 years and died from metastatic disease Father skin cancer, vocal cord cancer, alcohol abuse  ALLERGIES:  ?PCN  MEDICATIONS:  Current Outpatient Medications  Medication Sig Dispense Refill   LORazepam (ATIVAN) 0.5 MG tablet Take 1 tablet (0.5 mg total) by mouth every 8 (eight) hours as needed for anxiety. 30 tablet 0   No current facility-administered medications for this visit.    REVIEW OF SYSTEMS:   .10 Point review of Systems was done is negative except as noted above.  PHYSICAL EXAMINATION: ECOG FS:1 - Symptomatic but completely ambulatory  Vitals:   02/15/21 0856  BP: 134/60  Pulse: 63  Resp: 17  Temp: 97.8 F (36.6 C)  SpO2: 98%    Wt Readings from Last 3 Encounters:  02/15/21 223 lb 14.4 oz (101.6 kg)  12/14/20 219 lb 6.4 oz (99.5 kg)  10/19/20 217 lb 6.4 oz (98.6 kg)   Body mass index is 42.31 kg/m.   [name] GENERAL:alert, in no acute distress and comfortable SKIN: no acute rashes, no significant lesions EYES: conjunctiva are pink and non-injected, sclera anicteric OROPHARYNX: MMM, no exudates, no oropharyngeal erythema or ulceration NECK:  supple, no JVD LYMPH:  no palpable lymphadenopathy in the cervical, axillary or inguinal regions LUNGS: clear to auscultation b/l with normal respiratory effort HEART: regular rate & rhythm ABDOMEN:  normoactive bowel sounds , non tender, not distended. Extremity: no pedal edema PSYCH: alert & oriented x 3 with fluent speech NEURO: no focal motor/sensory deficits   LABORATORY DATA:  I have reviewed the data as listed  CBC Latest Ref Rng & Units 02/15/2021 12/14/2020 10/19/2020  WBC 4.0 - 10.5 K/uL 6.9 6.7 5.9  Hemoglobin 12.0 - 15.0 g/dL 14.0 13.6 13.8   Hematocrit 36.0 - 46.0 % 41.5 40.8 41.1  Platelets 150 - 400 K/uL 155 150 141(L)   CBC    Component Value Date/Time   WBC 6.9 02/15/2021 0842   WBC 5.8 07/05/2020 0900   RBC 4.88 02/15/2021 0842   HGB 14.0 02/15/2021 0842   HGB 15.0 01/03/2017 1138   HCT 41.5 02/15/2021 0842   HCT 44.4 01/03/2017 1138   PLT 155 02/15/2021 0842   PLT 169 01/03/2017 1138   MCV 85.0 02/15/2021 0842   MCV 86.9 01/03/2017 1138   MCH 28.7 02/15/2021 0842   MCHC 33.7 02/15/2021 0842   RDW 12.9 02/15/2021 0842   RDW 12.9 01/03/2017 1138   LYMPHSABS 1.4 02/15/2021 0842   LYMPHSABS 1.8 01/03/2017 1138   MONOABS 0.6 02/15/2021 0842   MONOABS 0.4 01/03/2017 1138   EOSABS 0.3 02/15/2021 0842   EOSABS 0.2 01/03/2017 1138   BASOSABS 0.0 02/15/2021 0842   BASOSABS 0.0 01/03/2017 1138    . CMP Latest Ref Rng & Units 02/15/2021 12/14/2020 10/19/2020  Glucose 70 - 99 mg/dL 106(H) 91 96  BUN 8 - 23 mg/dL 19 18 21   Creatinine 0.44 - 1.00 mg/dL 0.75 0.76 0.76  Sodium 135 - 145 mmol/L 140 142 143  Potassium 3.5 - 5.1 mmol/L 4.1 4.1 4.1  Chloride 98 - 111 mmol/L 107 110 108  CO2 22 - 32 mmol/L 26 25 23   Calcium 8.9 - 10.3 mg/dL 9.3 8.9 9.2  Total Protein 6.5 - 8.1 g/dL 6.4(L) 6.3(L) 6.3(L)  Total Bilirubin 0.3 - 1.2 mg/dL 0.8 0.6 0.8  Alkaline Phos 38 - 126 U/L 79 85 79  AST 15 - 41 U/L 16 33 22  ALT 0 - 44 U/L 25 50(H) 31    Lab Results  Component Value Date   LDH 144 02/15/2021    ...   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   PERIPHERAL BLOOD:  - Abnormal lymphocytes present.  - Mild thrombocytopenia.   COMMENT:   The marrow is normocellular but exhibits a few small abnormal lymphoid  aggregates. Flow cytometry reveals a monoclonal  B-cell population with a  similar phenotype (no CD5 or CD10 expression) to that of the patient's  prior lymphoma. The overall findings are consistent with mild  involvement by the patient's prior non-Hodgkin B-cell lymphoma (splenic  marginal zone lymphoma). While there is no absolute lymphocytosis, there  are abnormal lymphocytes which likely represent peripheral blood  involvement.   07/05/2020 Cytogenetics Report    RADIOGRAPHIC STUDIES: ABDOMEN ULTRASOUND COMPLETE   COMPARISON:  CT 08/03/2016   FINDINGS: Gallbladder: No gallstones or wall thickening visualized. No sonographic Murphy sign noted by sonographer.   Common bile duct: Diameter: 1.8 mm.   Liver: No focal lesion identified. Within normal limits in parenchymal echogenicity. Portal vein is patent on color Doppler imaging with normal direction of blood flow towards the liver.   IVC: No abnormality visualized.   Pancreas: Visualized portion unremarkable.   Spleen: Mild to moderate splenomegaly as the spleen measures 12.9 cm in greatest diameter, although splenic volume is increased measuring 785 cubic cm.   Right Kidney: Length: 10.1 cm. Echogenicity within normal limits. No mass or hydronephrosis visualized.   Left Kidney: Length: 10.8 cm. Echogenicity within normal limits. No mass or hydronephrosis visualized.   Abdominal aorta: No aneurysm visualized.   Other findings: None.   IMPRESSION: No acute findings.   Known mild to moderate splenomegaly with splenic volume 785 cubic cm.     Electronically Signed   By: Marin Olp M.D.   On: 11/30/2016 10:09   ASSESSMENT & PLAN:   66 y.o. Caucasian female with  #1 Stage  IV relapsed splenic Marginal Zone lymphoma   Initially presented with Massive splenomegaly with the spleen measuring 23.0 x 20.2 x 11.6 cm (volume = 2820 cm^3). Diffusely hypermetabolic on PET/CT No evidence of liver disease on CT scan. No evidence of portal hypertension. 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 Platelets today have normalized to 155k  #3 Indetermine left renal lesion 1.5 cms in size ?complex cyst - will need monitoring. No reports of this on [place] ab in 11/2016.  #4 Severe anxiety- improved since initial treatment.   PLAN: -Patient is here for continued maintenance Rituxan for her relapsed splenic marginal zone lymphoma. Labs today show normal CBC, stable CMP LDH level is normal at 144 Patient with no new toxicities from her maintenance Rituxan therapy. -No symptoms suggestive of lymphoma progression at this time -Patient is appropriate to proceed with her neck cycle of maintenance Rituxan today with the same supportive medications for her relapsed marginal zone lymphoma. -Counseled to continue to take infection precautions  FOLLOW UP: Please schedule next 2 cycles of maintenance Rituxan every 2 months with labs and MD visits  All of the patient's questions were answered with apparent satisfaction. The patient knows to call the clinic with any problems, questions or concerns.   Sullivan Lone MD [place] AAHIVMS [facility] Hematology/Oncology Physician [facility]

## 2021-03-07 DIAGNOSIS — Z0000 Encounter for general adult medical examination without abnormal findings: Secondary | ICD-10-CM | POA: Diagnosis not present

## 2021-03-07 DIAGNOSIS — E785 Hyperlipidemia, unspecified: Secondary | ICD-10-CM | POA: Diagnosis not present

## 2021-03-07 DIAGNOSIS — C8580 Other specified types of non-Hodgkin lymphoma, unspecified site: Secondary | ICD-10-CM | POA: Diagnosis not present

## 2021-03-07 DIAGNOSIS — E559 Vitamin D deficiency, unspecified: Secondary | ICD-10-CM | POA: Diagnosis not present

## 2021-03-07 DIAGNOSIS — R7309 Other abnormal glucose: Secondary | ICD-10-CM | POA: Diagnosis not present

## 2021-03-09 DIAGNOSIS — E785 Hyperlipidemia, unspecified: Secondary | ICD-10-CM | POA: Diagnosis not present

## 2021-03-09 DIAGNOSIS — Z0000 Encounter for general adult medical examination without abnormal findings: Secondary | ICD-10-CM | POA: Diagnosis not present

## 2021-03-09 DIAGNOSIS — E559 Vitamin D deficiency, unspecified: Secondary | ICD-10-CM | POA: Diagnosis not present

## 2021-03-09 DIAGNOSIS — C8580 Other specified types of non-Hodgkin lymphoma, unspecified site: Secondary | ICD-10-CM | POA: Diagnosis not present

## 2021-04-10 ENCOUNTER — Telehealth: Payer: Self-pay | Admitting: Hematology

## 2021-04-10 NOTE — Telephone Encounter (Signed)
Rescheduled upcoming appointment per patient's request. Patient is aware of changes. 

## 2021-04-12 ENCOUNTER — Inpatient Hospital Stay: Payer: BC Managed Care – PPO

## 2021-04-12 ENCOUNTER — Other Ambulatory Visit: Payer: Self-pay

## 2021-04-12 ENCOUNTER — Inpatient Hospital Stay (HOSPITAL_BASED_OUTPATIENT_CLINIC_OR_DEPARTMENT_OTHER): Payer: BC Managed Care – PPO | Admitting: Hematology

## 2021-04-12 ENCOUNTER — Inpatient Hospital Stay: Payer: BC Managed Care – PPO | Attending: Hematology

## 2021-04-12 VITALS — BP 131/57 | HR 61 | Temp 97.9°F | Resp 18

## 2021-04-12 VITALS — BP 112/73 | HR 57 | Temp 97.5°F | Resp 20 | Wt 222.9 lb

## 2021-04-12 DIAGNOSIS — Z79899 Other long term (current) drug therapy: Secondary | ICD-10-CM | POA: Diagnosis not present

## 2021-04-12 DIAGNOSIS — Z5112 Encounter for antineoplastic immunotherapy: Secondary | ICD-10-CM

## 2021-04-12 DIAGNOSIS — Z7189 Other specified counseling: Secondary | ICD-10-CM

## 2021-04-12 DIAGNOSIS — C8307 Small cell B-cell lymphoma, spleen: Secondary | ICD-10-CM | POA: Diagnosis not present

## 2021-04-12 LAB — CBC WITH DIFFERENTIAL (CANCER CENTER ONLY)
Abs Immature Granulocytes: 0.01 10*3/uL (ref 0.00–0.07)
Basophils Absolute: 0 10*3/uL (ref 0.0–0.1)
Basophils Relative: 1 %
Eosinophils Absolute: 0.2 10*3/uL (ref 0.0–0.5)
Eosinophils Relative: 4 %
HCT: 43.5 % (ref 36.0–46.0)
Hemoglobin: 14.5 g/dL (ref 12.0–15.0)
Immature Granulocytes: 0 %
Lymphocytes Relative: 21 %
Lymphs Abs: 1.2 10*3/uL (ref 0.7–4.0)
MCH: 28.3 pg (ref 26.0–34.0)
MCHC: 33.3 g/dL (ref 30.0–36.0)
MCV: 84.8 fL (ref 80.0–100.0)
Monocytes Absolute: 0.4 10*3/uL (ref 0.1–1.0)
Monocytes Relative: 7 %
Neutro Abs: 4.1 10*3/uL (ref 1.7–7.7)
Neutrophils Relative %: 67 %
Platelet Count: 173 10*3/uL (ref 150–400)
RBC: 5.13 MIL/uL — ABNORMAL HIGH (ref 3.87–5.11)
RDW: 13.1 % (ref 11.5–15.5)
WBC Count: 6 10*3/uL (ref 4.0–10.5)
nRBC: 0 % (ref 0.0–0.2)

## 2021-04-12 LAB — CMP (CANCER CENTER ONLY)
ALT: 31 U/L (ref 0–44)
AST: 21 U/L (ref 15–41)
Albumin: 4.3 g/dL (ref 3.5–5.0)
Alkaline Phosphatase: 82 U/L (ref 38–126)
Anion gap: 7 (ref 5–15)
BUN: 20 mg/dL (ref 8–23)
CO2: 27 mmol/L (ref 22–32)
Calcium: 9.5 mg/dL (ref 8.9–10.3)
Chloride: 108 mmol/L (ref 98–111)
Creatinine: 0.73 mg/dL (ref 0.44–1.00)
GFR, Estimated: >60 mL/min (ref >60)
Glucose, Bld: 111 mg/dL — ABNORMAL HIGH (ref 70–99)
Potassium: 4.1 mmol/L (ref 3.5–5.1)
Sodium: 142 mmol/L (ref 135–145)
Total Bilirubin: 0.8 mg/dL (ref 0.3–1.2)
Total Protein: 6.5 g/dL (ref 6.5–8.1)

## 2021-04-12 LAB — LACTATE DEHYDROGENASE: LDH: 142 U/L (ref 98–192)

## 2021-04-12 MED ORDER — SODIUM CHLORIDE 0.9 % IV SOLN
375.0000 mg/m2 | Freq: Once | INTRAVENOUS | Status: AC
  Administered 2021-04-12: 800 mg via INTRAVENOUS
  Filled 2021-04-12: qty 50

## 2021-04-12 MED ORDER — SODIUM CHLORIDE 0.9 % IV SOLN: Freq: Once | INTRAVENOUS | Status: AC

## 2021-04-12 MED ORDER — FAMOTIDINE IN NACL 20-0.9 MG/50ML-% IV SOLN
20.0000 mg | Freq: Once | INTRAVENOUS | Status: AC
  Administered 2021-04-12: 20 mg via INTRAVENOUS
  Filled 2021-04-12: qty 50

## 2021-04-12 MED ORDER — METHYLPREDNISOLONE SODIUM SUCC 125 MG IJ SOLR
125.0000 mg | Freq: Once | INTRAMUSCULAR | Status: AC
  Administered 2021-04-12: 125 mg via INTRAVENOUS
  Filled 2021-04-12: qty 2

## 2021-04-12 MED ORDER — LORAZEPAM 1 MG PO TABS
0.5000 mg | ORAL_TABLET | Freq: Once | ORAL | Status: AC
  Administered 2021-04-12: 0.5 mg via ORAL
  Filled 2021-04-12: qty 1

## 2021-04-12 MED ORDER — DIPHENHYDRAMINE HCL 25 MG PO CAPS
50.0000 mg | ORAL_CAPSULE | Freq: Once | ORAL | Status: AC
  Administered 2021-04-12: 50 mg via ORAL
  Filled 2021-04-12: qty 2

## 2021-04-12 MED ORDER — ACETAMINOPHEN 325 MG PO TABS
650.0000 mg | ORAL_TABLET | Freq: Once | ORAL | Status: AC
  Administered 2021-04-12: 650 mg via ORAL
  Filled 2021-04-12: qty 2

## 2021-04-12 NOTE — Patient Instructions (Signed)
[facility]  Discharge Instructions: ?Thank you for choosing [facility] to provide your oncology and hematology care.  ? ?If you have a lab appointment with the [facility], please go directly to the [facility] and check in at the registration area. ?  ?Wear comfortable clothing and clothing appropriate for easy access to any Portacath or PICC line.  ? ?We strive to give you quality time with your provider. You may need to reschedule your appointment if you arrive late (15 or more minutes).  Arriving late affects you and other patients whose appointments are after yours.  Also, if you miss three or more appointments without notifying the office, you may be dismissed from the clinic at the provider?s discretion.    ?  ?For prescription refill requests, have your pharmacy contact our office and allow 72 hours for refills to be completed.   ? ?Today you received the following chemotherapy and/or immunotherapy agents: rituximab.    ?  ?To help prevent nausea and vomiting after your treatment, we encourage you to take your nausea medication as directed. ? ?BELOW ARE SYMPTOMS THAT SHOULD BE REPORTED IMMEDIATELY: ?*FEVER GREATER THAN 100.4 F (38 ?C) OR HIGHER ?*CHILLS OR SWEATING ?*NAUSEA AND VOMITING THAT IS NOT CONTROLLED WITH YOUR NAUSEA MEDICATION ?*UNUSUAL SHORTNESS OF BREATH ?*UNUSUAL BRUISING OR BLEEDING ?*URINARY PROBLEMS (pain or burning when urinating, or frequent urination) ?*BOWEL PROBLEMS (unusual diarrhea, constipation, pain near the anus) ?TENDERNESS IN MOUTH AND THROAT WITH OR WITHOUT PRESENCE OF ULCERS (sore throat, sores in mouth, or a toothache) ?UNUSUAL RASH, SWELLING OR PAIN  ?UNUSUAL VAGINAL DISCHARGE OR ITCHING  ? ?Items with * indicate a potential emergency and should be followed up as soon as possible or go to the Emergency Department if any problems should occur. ? ?Please show the CHEMOTHERAPY ALERT CARD or IMMUNOTHERAPY ALERT CARD at check-in to  the Emergency Department and triage nurse. ? ?Should you have questions after your visit or need to cancel or reschedule your appointment, please contact [facility]  Dept: [phone]  and follow the prompts.  Office hours are 8:00 a.m. to 4:30 p.m. Monday - Friday. Please note that voicemails left after 4:00 p.m. may not be returned until the following business day.  We are closed weekends and major holidays. You have access to a nurse at all times for urgent questions. Please call the main number to the clinic Dept: [phone] and follow the prompts. ? ? ?For any non-urgent questions, you may also contact your provider using MyChart. We now offer e-Visits for anyone 85 and older to request care online for non-urgent symptoms. For details visit [email or web address]. ?  ?Also download the MyChart app! Go to the app store, search "MyChart", open the app, select [facility], and log in with your MyChart username and password. ? ?Due to Covid, a mask is required upon entering the hospital/clinic. If you do not have a mask, one will be given to you upon arrival. For doctor visits, patients may have 1 support person aged 20 or older with them. For treatment visits, patients cannot have anyone with them due to current Covid guidelines and our immunocompromised population.  ? ?

## 2021-04-13 DIAGNOSIS — Z1211 Encounter for screening for malignant neoplasm of colon: Secondary | ICD-10-CM | POA: Diagnosis not present

## 2021-04-18 NOTE — Progress Notes (Signed)
. ? ? ? ?HEMATOLOGY/ONCOLOGY CLINIC NOTE ? ?Date of Service: .04/12/2021 ? ? ?Patient Care Team: ?[name], MD as PCP - General (Family Medicine)  ?PCP: [name] MD  ? ?CHIEF COMPLAINTS/PURPOSE OF CONSULTATION: ?Follow-up for continued evaluation and management of relapsed splenic marginal zone lymphoma. ? ?HISTORY OF PRESENTING ILLNESS:  ? ?Please see previous note for details on initial presentation ? ?INTERVAL HISTORY ? ?Wendy Avery is here for continued evaluation and management of her relapsed splenic marginal zone lymphoma and prior to her next dose of maintenance Rituxan. ?She notes no acute new symptoms since her last clinic visit. ?No infection issues. ?Her labs done today were reviewed in detail with the patient ? ?MEDICAL HISTORY:  ? ?1) Severe -Anxiety ?2) IBS - constipation/diarrhea/dyspepsia ?3) history of nephrolithiasis ? 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 ? ?SOCIAL HISTORY: ?Social History  ? ?Socioeconomic History  ? Marital status: Widowed  ?  Spouse name: Not on file  ? Number of children: Not on file  ? Years of education: Not on file  ? Highest education level: Not on file  ?Occupational History  ? Not on file  ?Tobacco Use  ? Smoking status: Former  ?  Types: Cigarettes  ?  Quit date: 2010  ?  Years since quitting: 13.2  ? Smokeless tobacco: Never  ?Vaping Use  ? Vaping Use: Never used  ?Substance and Sexual Activity  ? Alcohol use: Yes  ?  Comment: occassional   ? Drug use: No  ? Sexual activity: Not on file  ?Other Topics Concern  ? Not on file  ?Social History Narrative  ? Not on file  ? ?Social Determinants of Health  ? ?Financial Resource Strain: Not on file  ?Food Insecurity: Not on file  ?Transportation Needs: Not on file  ?Physical Activity: Not on file  ?Stress: Not on file  ?Social Connections: Not on file  ?Intimate Partner  Violence: Not on file  ?Ex-smoker 1 pack per day quit 30 years ago ?Uses alcohol socially ?She is widowed and has 2 daughters ? ? ?FAMILY HISTORY: ?Mom had breast cancer at age 6 years and died from metastatic disease ?Father skin cancer, vocal cord cancer, alcohol abuse ? ?ALLERGIES:  ?PCN ? ?MEDICATIONS:  ?Current Outpatient Medications  ?Medication Sig Dispense Refill  ? LORazepam (ATIVAN) 0.5 MG tablet Take 1 tablet (0.5 mg total) by mouth every 8 (eight) hours as needed for anxiety. 30 tablet 0  ? ?No current facility-administered medications for this visit.  ? ? ?REVIEW OF SYSTEMS:   ?10 Point review of Systems was done is negative except as noted above. ? ?PHYSICAL EXAMINATION: ?ECOG FS:1 - Symptomatic but completely ambulatory ? ?Vitals:  ? 04/12/21 1000  ?BP: 112/73  ?Pulse: (!) 57  ?Resp: 20  ?Temp: (!) 97.5 ?F (36.4 ?C)  ?SpO2: 99%  ? ? ?Wt Readings from Last 3 Encounters:  ?04/12/21 222 lb 14.4 oz (101.1 kg)  ?02/15/21 223 lb 14.4 oz (101.6 kg)  ?12/14/20 219 lb 6.4 oz (99.5 kg)  ? ?Body mass index is 42.12 kg/m?.   ?.  NAD ?GENERAL:alert, in no acute distress and comfortable ?SKIN: no acute rashes, no significant lesions ?EYES: conjunctiva are pink and non-injected, sclera anicteric ?OROPHARYNX: MMM, no exudates, no oropharyngeal erythema or ulceration ?NECK: supple, no JVD ?LYMPH:  no palpable lymphadenopathy in the cervical, axillary  or inguinal regions ?LUNGS: clear to auscultation b/l with normal respiratory effort ?HEART: regular rate & rhythm ?ABDOMEN:  normoactive bowel sounds , non tender, not distended. ?Extremity: no pedal edema ?PSYCH: alert & oriented x 3 with fluent speech ?NEURO: no focal motor/sensory deficits ? ? ?LABORATORY DATA:  ?I have reviewed the data as listed ? ? ?  Latest Ref Rng & Units 04/12/2021  ?  9:36 AM 02/15/2021  ?  8:42 AM 12/14/2020  ?  8:50 AM  ?CBC  ?WBC 4.0 - 10.5 K/uL 6.0   6.9   6.7    ?Hemoglobin 12.0 - 15.0 g/dL 14.5   14.0   13.6    ?Hematocrit 36.0 - 46.0 %  43.5   41.5   40.8    ?Platelets 150 - 400 K/uL 173   155   150    ? ?CBC ?   ?Component Value Date/Time  ? WBC 6.0 04/12/2021 0936  ? WBC 5.8 07/05/2020 0900  ? RBC 5.13 (H) 04/12/2021 0936  ? HGB 14.5 04/12/2021 0936  ? HGB 15.0 01/03/2017 1138  ? HCT 43.5 04/12/2021 0936  ? HCT 44.4 01/03/2017 1138  ? PLT 173 04/12/2021 0936  ? PLT 169 01/03/2017 1138  ? MCV 84.8 04/12/2021 0936  ? MCV 86.9 01/03/2017 1138  ? MCH 28.3 04/12/2021 0936  ? MCHC 33.3 04/12/2021 0936  ? RDW 13.1 04/12/2021 0936  ? RDW 12.9 01/03/2017 1138  ? LYMPHSABS 1.2 04/12/2021 0936  ? LYMPHSABS 1.8 01/03/2017 1138  ? MONOABS 0.4 04/12/2021 0936  ? MONOABS 0.4 01/03/2017 1138  ? EOSABS 0.2 04/12/2021 0936  ? EOSABS 0.2 01/03/2017 1138  ? BASOSABS 0.0 04/12/2021 0936  ? BASOSABS 0.0 01/03/2017 1138  ? ? ?. ? ?  Latest Ref Rng & Units 04/12/2021  ?  9:36 AM 02/15/2021  ?  8:42 AM 12/14/2020  ?  8:50 AM  ?CMP  ?Glucose 70 - 99 mg/dL 111   106   91    ?BUN 8 - 23 mg/dL '20   19   18    '$ ?Creatinine 0.44 - 1.00 mg/dL 0.73   0.75   0.76    ?Sodium 135 - 145 mmol/L 142   140   142    ?Potassium 3.5 - 5.1 mmol/L 4.1   4.1   4.1    ?Chloride 98 - 111 mmol/L 108   107   110    ?CO2 22 - 32 mmol/L '27   26   25    '$ ?Calcium 8.9 - 10.3 mg/dL 9.5   9.3   8.9    ?Total Protein 6.5 - 8.1 g/dL 6.5   6.4   6.3    ?Total Bilirubin 0.3 - 1.2 mg/dL 0.8   0.8   0.6    ?Alkaline Phos 38 - 126 U/L 82   79   85    ?AST 15 - 41 U/L 21   16   33    ?ALT 0 - 44 U/L 31   25   50    ? ? ?Lab Results  ?Component Value Date  ? LDH 142 04/12/2021  ? ? ?... ? ? ?Component ?    Latest Ref Rng & Units 09/07/2016  ?Hep B Core Ab, Tot ?    Negative Negative  ?Hepatitis B Surface Ag ?    Negative Negative  ? ?Component ?    Latest Ref Rng & Units 08/10/2016  ?Sed Rate ?    0 -  40 mm/hr 12  ?LDH ?    125 - 245 U/L 178  ?EBV VCA IgM ?    0.0 - 35.9 U/mL <36.0  ?CMV IgM Ser EIA-aCnc ?    0.0 - 29.9 AU/mL <30.0  ?HIV Screen 4th Generation wRfx ?    Non Reactive Non Reactive  ?Hep C Virus Ab ?     0.0 - 0.9 s/co ratio <0.1  ? ? ? 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  ? ?PERIPHERAL BLOOD:  ?- Abnormal lymphocytes present.  ?- Mild thrombocytopenia.  ? ?COMMENT:  ? ?The marrow is normocellular but exhibits a few small abnormal lymphoid  ?aggregates. Flow cytometry reveals a monoclonal B-cell population with a  ?similar phenotype (no CD5 or CD10 expression) to that of the patient's  ?prior lymphoma. The overall findings are consistent with mild  ?involvement by the patient's prior non-Hodgkin B-cell lymphoma (splenic  ?marginal zone lymphoma). While there is no absolute lymphocytosis, there  ?are abnormal lymphocytes which likely represent peripheral blood  ?involvement.  ? ?07/05/2020 Cytogenetics Report ? ? ? ?RADIOGRAPHIC STUDIES: ?ABDOMEN ULTRASOUND COMPLETE ?  ?COMPARISON:  CT 08/03/2016 ?  ?FINDINGS: ?Gallbladder: No gallstones or wall thickening visualized. No ?sonographic Murphy sign noted by sonographer. ?  ?Common bile duct: Diameter: 1.8 mm. ?  ?Liver: No focal lesion identified. Within normal limits in ?parenchymal echogenicity. Portal vein is patent on color Doppler ?imaging with normal direction of blood flow towards the liver. ?  ?IVC: No abnormality visualized. ?  ?Pancreas: Visualized portion unremarkable. ?  ?Spleen: Mild to moderate splenomegaly as the spleen measures 12.9 cm ?in greatest diameter, although splenic volume is increased measuring ?785 cubic cm. ?  ?Right Kidney: Length: 10.1 cm. Echogenicity within normal limits. No ?mass or hydronephrosis visualized. ?  ?Left Kidney: Length: 10.8 cm. Echogenicity within normal limits. No ?mass or hydronephrosis visualized. ?  ?Abdominal aorta: No aneurysm visualized. ?  ?Other findings: None. ?  ?IMPRESSION: ?No acute findings. ?  ?Known mild to moderate splenomegaly with splenic volume 785 cubic ?cm. ?  ?  ?Electronically Signed ?  By: Marin Olp M.D. ?  On:  11/30/2016 10:09 ? ? ?ASSESSMENT & PLAN:  ? ?66 y.o. Caucasian female with ? 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 ?Initially presented with Massive splenomegaly with the spleen measuring

## 2021-04-19 ENCOUNTER — Encounter: Payer: Self-pay | Admitting: Hematology

## 2021-06-13 ENCOUNTER — Ambulatory Visit: Payer: BC Managed Care – PPO | Admitting: Hematology

## 2021-06-13 ENCOUNTER — Other Ambulatory Visit: Payer: BC Managed Care – PPO

## 2021-06-13 ENCOUNTER — Ambulatory Visit: Payer: BC Managed Care – PPO

## 2021-06-15 ENCOUNTER — Other Ambulatory Visit: Payer: Self-pay

## 2021-06-15 ENCOUNTER — Inpatient Hospital Stay: Payer: BC Managed Care – PPO | Admitting: Hematology

## 2021-06-15 ENCOUNTER — Inpatient Hospital Stay: Payer: BC Managed Care – PPO

## 2021-06-15 ENCOUNTER — Inpatient Hospital Stay: Payer: BC Managed Care – PPO | Attending: Hematology

## 2021-06-15 VITALS — BP 126/55 | HR 67 | Temp 97.8°F | Resp 17

## 2021-06-15 VITALS — BP 117/48 | HR 65 | Temp 97.3°F | Resp 20 | Wt 224.6 lb

## 2021-06-15 DIAGNOSIS — C8307 Small cell B-cell lymphoma, spleen: Secondary | ICD-10-CM

## 2021-06-15 DIAGNOSIS — Z79899 Other long term (current) drug therapy: Secondary | ICD-10-CM | POA: Insufficient documentation

## 2021-06-15 DIAGNOSIS — Z7189 Other specified counseling: Secondary | ICD-10-CM

## 2021-06-15 DIAGNOSIS — Z5112 Encounter for antineoplastic immunotherapy: Secondary | ICD-10-CM | POA: Diagnosis not present

## 2021-06-15 LAB — CMP (CANCER CENTER ONLY)
ALT: 27 U/L (ref 0–44)
AST: 18 U/L (ref 15–41)
Albumin: 4.1 g/dL (ref 3.5–5.0)
Alkaline Phosphatase: 83 U/L (ref 38–126)
Anion gap: 7 (ref 5–15)
BUN: 19 mg/dL (ref 8–23)
CO2: 27 mmol/L (ref 22–32)
Calcium: 9.1 mg/dL (ref 8.9–10.3)
Chloride: 108 mmol/L (ref 98–111)
Creatinine: 0.75 mg/dL (ref 0.44–1.00)
GFR, Estimated: >60 mL/min (ref >60)
Glucose, Bld: 164 mg/dL — ABNORMAL HIGH (ref 70–99)
Potassium: 3.9 mmol/L (ref 3.5–5.1)
Sodium: 142 mmol/L (ref 135–145)
Total Bilirubin: 0.6 mg/dL (ref 0.3–1.2)
Total Protein: 6.3 g/dL — ABNORMAL LOW (ref 6.5–8.1)

## 2021-06-15 LAB — CBC WITH DIFFERENTIAL (CANCER CENTER ONLY)
Abs Immature Granulocytes: 0.02 10*3/uL (ref 0.00–0.07)
Basophils Absolute: 0 10*3/uL (ref 0.0–0.1)
Basophils Relative: 1 %
Eosinophils Absolute: 0.3 10*3/uL (ref 0.0–0.5)
Eosinophils Relative: 4 %
HCT: 42.2 % (ref 36.0–46.0)
Hemoglobin: 14.3 g/dL (ref 12.0–15.0)
Immature Granulocytes: 0 %
Lymphocytes Relative: 19 %
Lymphs Abs: 1.2 10*3/uL (ref 0.7–4.0)
MCH: 28.8 pg (ref 26.0–34.0)
MCHC: 33.9 g/dL (ref 30.0–36.0)
MCV: 85.1 fL (ref 80.0–100.0)
Monocytes Absolute: 0.5 10*3/uL (ref 0.1–1.0)
Monocytes Relative: 8 %
Neutro Abs: 4.6 10*3/uL (ref 1.7–7.7)
Neutrophils Relative %: 68 %
Platelet Count: 164 10*3/uL (ref 150–400)
RBC: 4.96 MIL/uL (ref 3.87–5.11)
RDW: 13.2 % (ref 11.5–15.5)
WBC Count: 6.7 10*3/uL (ref 4.0–10.5)
nRBC: 0 % (ref 0.0–0.2)

## 2021-06-15 LAB — LACTATE DEHYDROGENASE: LDH: 138 U/L (ref 98–192)

## 2021-06-15 MED ORDER — SODIUM CHLORIDE 0.9 % IV SOLN
375.0000 mg/m2 | Freq: Once | INTRAVENOUS | Status: AC
  Administered 2021-06-15: 800 mg via INTRAVENOUS
  Filled 2021-06-15: qty 50

## 2021-06-15 MED ORDER — SODIUM CHLORIDE 0.9 % IV SOLN: Freq: Once | INTRAVENOUS | Status: AC

## 2021-06-15 MED ORDER — ACETAMINOPHEN 325 MG PO TABS
650.0000 mg | ORAL_TABLET | Freq: Once | ORAL | Status: AC
  Administered 2021-06-15: 650 mg via ORAL
  Filled 2021-06-15: qty 2

## 2021-06-15 MED ORDER — FAMOTIDINE IN NACL 20-0.9 MG/50ML-% IV SOLN
20.0000 mg | Freq: Once | INTRAVENOUS | Status: AC
  Administered 2021-06-15: 20 mg via INTRAVENOUS
  Filled 2021-06-15: qty 50

## 2021-06-15 MED ORDER — DIPHENHYDRAMINE HCL 25 MG PO CAPS
50.0000 mg | ORAL_CAPSULE | Freq: Once | ORAL | Status: AC
  Administered 2021-06-15: 50 mg via ORAL
  Filled 2021-06-15: qty 2

## 2021-06-15 MED ORDER — LORAZEPAM 1 MG PO TABS
0.5000 mg | ORAL_TABLET | Freq: Once | ORAL | Status: AC
  Administered 2021-06-15: 0.5 mg via ORAL
  Filled 2021-06-15: qty 1

## 2021-06-15 MED ORDER — METHYLPREDNISOLONE SODIUM SUCC 125 MG IJ SOLR
125.0000 mg | Freq: Once | INTRAMUSCULAR | Status: AC
  Administered 2021-06-15: 125 mg via INTRAVENOUS
  Filled 2021-06-15: qty 2

## 2021-06-15 NOTE — Patient Instructions (Signed)
[facility] MEDICAL ONCOLOGY  Discharge Instructions: ?Thank you for choosing [facility] to provide your oncology and hematology care.  ? ?If you have a lab appointment with the Cancer Center, please go directly to the Cancer Center and check in at the registration area. ?  ?Wear comfortable clothing and clothing appropriate for easy access to any Portacath or PICC line.  ? ?We strive to give you quality time with your provider. You may need to reschedule your appointment if you arrive late (15 or more minutes).  Arriving late affects you and other patients whose appointments are after yours.  Also, if you miss three or more appointments without notifying the office, you may be dismissed from the clinic at the provider?s discretion.    ?  ?For prescription refill requests, have your pharmacy contact our office and allow 72 hours for refills to be completed.   ? ?Today you received the following chemotherapy and/or immunotherapy agents: Ruxience ?  ?To help prevent nausea and vomiting after your treatment, we encourage you to take your nausea medication as directed. ? ?BELOW ARE SYMPTOMS THAT SHOULD BE REPORTED IMMEDIATELY: ?*FEVER GREATER THAN 100.4 F (38 ?C) OR HIGHER ?*CHILLS OR SWEATING ?*NAUSEA AND VOMITING THAT IS NOT CONTROLLED WITH YOUR NAUSEA MEDICATION ?*UNUSUAL SHORTNESS OF BREATH ?*UNUSUAL BRUISING OR BLEEDING ?*URINARY PROBLEMS (pain or burning when urinating, or frequent urination) ?*BOWEL PROBLEMS (unusual diarrhea, constipation, pain near the anus) ?TENDERNESS IN MOUTH AND THROAT WITH OR WITHOUT PRESENCE OF ULCERS (sore throat, sores in mouth, or a toothache) ?UNUSUAL RASH, SWELLING OR PAIN  ?UNUSUAL VAGINAL DISCHARGE OR ITCHING  ? ?Items with * indicate a potential emergency and should be followed up as soon as possible or go to the Emergency Department if any problems should occur. ? ?Please show the CHEMOTHERAPY ALERT CARD or IMMUNOTHERAPY ALERT CARD at check-in to the  Emergency Department and triage nurse. ? ?Should you have questions after your visit or need to cancel or reschedule your appointment, please contact [facility] ONCOL[facility]  Dept: [phone]  and follow the prompts.  Office hours are 8:00 a.m. to 4:30 p.m. Monday - Friday. Please note that voicemails left after 4:00 p.m. may not be returned until the following business day.  We are closed weekends and major holidays. You have access to a nurse at all times for urgent questions. Please call the main number to the clinic Dept: [phone] and follow the prompts. ? ? ?For any non-urgent questions, you may also contact your provider using MyChart. We now offer e-Visits for anyone 18 and older to request care online for non-urgent symptoms. For details visit [email or web address]. ?  ?Also download the MyChart app! Go to the app store, search "MyChart", open the app, select [facility], and log in with your MyChart username and password. ? ?Due to Covid, a mask is required upon entering the hospital/clinic. If you do not have a mask, one will be given to you upon arrival. For doctor visits, patients may have 1 support person aged 18 or older with them. For treatment visits, patients cannot have anyone with them due to current Covid guidelines and our immunocompromised population.  ? ?

## 2021-06-21 ENCOUNTER — Encounter: Payer: Self-pay | Admitting: Hematology

## 2021-06-21 NOTE — Progress Notes (Signed)
[name]    HEMATOLOGY/ONCOLOGY CLINIC NOTE  Date of Service: .06/15/2021   Patient Care Team: [name], MD as PCP - General (Family Medicine)  PCP: [name] MD   CHIEF COMPLAINTS/PURPOSE OF CONSULTATION: Follow-up for continued evaluation and management of relapsed splenic marginal zone lymphoma and next cycle of maintenance Rituxan.  HISTORY OF PRESENTING ILLNESS:   Please see previous note for details on initial presentation  INTERVAL HISTORY  Wendy Avery is here for continued evaluation and management of her relapsed splenic marginal zone lymphoma and prior to her next cycle of maintenance Rituxan immunotherapy. She notes that she has been having significant issues with seasonal allergies with watery eyes and redness of her eyelids and sneezing and some clear nasal discharge. No fevers no chills no night sweats. No new chest pain or shortness of breath. No ocular pain or change in visual acuity. No Notable toxicities from her last dose of Rituxan.  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 #3 Right Shoulder excision of basal cell carcinoma in 2005   SOCIAL HISTORY: Social History   Socioeconomic History   Marital status: Widowed    Spouse name: Not on file   Number of children: Not on file   Years of education: Not on file   Highest education level: Not on file  Occupational History   Not on file  Tobacco Use   Smoking status: Former    Types: Cigarettes    Quit date: 2010    Years since quitting: 13.4   Smokeless tobacco: Never  Vaping Use   Vaping Use: Never used  Substance and Sexual Activity   Alcohol use: Yes    Comment: occassional    Drug use: No   Sexual activity: Not on file  Other Topics Concern   Not on file  Social History  Narrative   Not on file   Social Determinants of Health   Financial Resource Strain: Not on file  Food Insecurity: Not on file  Transportation Needs: Not on file  Physical Activity: Not on file  Stress: Not on file  Social Connections: Not on file  Intimate Partner Violence: Not on file  Ex-smoker 1 pack per day quit 30 years ago Uses alcohol socially She is widowed and has 2 daughters   FAMILY HISTORY: Mom had breast cancer at age 54 years and died from metastatic disease Father skin cancer, vocal cord cancer, alcohol abuse  ALLERGIES:  ?PCN  MEDICATIONS:  Current Outpatient Medications  Medication Sig Dispense Refill   LORazepam (ATIVAN) 0.5 MG tablet Take 1 tablet (0.5 mg total) by mouth every 8 (eight) hours as needed for anxiety. 30 tablet 0   No current facility-administered medications for this visit.    REVIEW OF SYSTEMS:   10 Point review of Systems was done is negative except as noted above.  PHYSICAL EXAMINATION: ECOG FS:1 - Symptomatic but completely ambulatory  Vitals:   06/15/21 0940  BP: (!) 117/48  Pulse: 65  Resp: 20  Temp: (!) 97.3 F (36.3 C)  SpO2: 99%    Wt Readings from Last 3 Encounters:  06/15/21 224 lb 9.6 oz (101.9 kg)  04/12/21 222 lb 14.4 oz (101.1 kg)  02/15/21 223 lb 14.4 oz (101.6 kg)   Body mass index is 42.44 kg/m.   NAD  GENERAL:alert, in no acute distress and comfortable SKIN: no acute rashes, no significant lesions EYES: conjunctiva are pink and non-injected, sclera anicteric OROPHARYNX: MMM, no exudates, no oropharyngeal erythema or ulceration NECK: supple, no JVD LYMPH:  no palpable lymphadenopathy in the cervical, axillary or inguinal regions LUNGS: clear to auscultation b/l with normal respiratory effort HEART: regular rate & rhythm ABDOMEN:  normoactive bowel sounds , non tender, not distended. Extremity: no pedal edema PSYCH: alert & oriented x 3 with fluent speech NEURO: no focal motor/sensory  deficits    LABORATORY DATA:  I have reviewed the data as listed     Latest Ref Rng & Units 06/15/2021    9:13 AM 04/12/2021    9:36 AM 02/15/2021    8:42 AM  CBC  WBC 4.0 - 10.5 K/uL 6.7   6.0   6.9    Hemoglobin 12.0 - 15.0 g/dL 14.3   14.5   14.0    Hematocrit 36.0 - 46.0 % 42.2   43.5   41.5    Platelets 150 - 400 K/uL 164   173   155     CBC    Component Value Date/Time   WBC 6.7 06/15/2021 0913   WBC 5.8 07/05/2020 0900   RBC 4.96 06/15/2021 0913   HGB 14.3 06/15/2021 0913   HGB 15.0 01/03/2017 1138   HCT 42.2 06/15/2021 0913   HCT 44.4 01/03/2017 1138   PLT 164 06/15/2021 0913   PLT 169 01/03/2017 1138   MCV 85.1 06/15/2021 0913   MCV 86.9 01/03/2017 1138   MCH 28.8 06/15/2021 0913   MCHC 33.9 06/15/2021 0913   RDW 13.2 06/15/2021 0913   RDW 12.9 01/03/2017 1138   LYMPHSABS 1.2 06/15/2021 0913   LYMPHSABS 1.8 01/03/2017 1138   MONOABS 0.5 06/15/2021 0913   MONOABS 0.4 01/03/2017 1138   EOSABS 0.3 06/15/2021 0913   EOSABS 0.2 01/03/2017 1138   BASOSABS 0.0 06/15/2021 0913   BASOSABS 0.0 01/03/2017 1138    .    Latest Ref Rng & Units 06/15/2021    9:13 AM 04/12/2021    9:36 AM 02/15/2021    8:42 AM  CMP  Glucose 70 - 99 mg/dL 164   111   106    BUN 8 - 23 mg/dL '19   20   19    '$ Creatinine 0.44 - 1.00 mg/dL 0.75   0.73   0.75    Sodium 135 - 145 mmol/L 142   142   140    Potassium 3.5 - 5.1 mmol/L 3.9   4.1   4.1    Chloride 98 - 111 mmol/L 108   108   107    CO2 22 - 32 mmol/L '27   27   26    '$ Calcium 8.9 - 10.3 mg/dL 9.1   9.5   9.3    Total Protein 6.5 - 8.1 g/dL 6.3   6.5   6.4    Total Bilirubin 0.3 - 1.2 mg/dL 0.6   0.8   0.8    Alkaline Phos 38 - 126 U/L 83   82   79    AST 15 - 41 U/L '18   21   16    '$ ALT 0 - 44 U/L '27   31   25      '$ Lab Results  Component Value Date   LDH 138 06/15/2021    ...   Component     Latest Ref Rng & Units 09/07/2016  Hep B Core Ab, Tot     Negative Negative  Hepatitis B Surface Ag     Negative Negative    Component     Latest Ref Rng & Units 08/10/2016  Sed Rate     0 - 40 mm/hr 12  LDH     125 - 245 U/L 178  EBV VCA IgM     0.0 - 35.9 U/mL <36.0  CMV IgM Ser EIA-aCnc     0.0 - 29.9 AU/mL <30.0  HIV Screen 4th Generation wRfx     Non Reactive Non Reactive  Hep C Virus Ab     0.0 - 0.9 s/co ratio <0.1     07/05/2020 Surgical Pathology  BONE MARROW, ASPIRATE, CLOT, CORE:  - Mild involvement by non-Hodgkin B-cell lymphoma.   PERIPHERAL BLOOD:  - Abnormal lymphocytes present.  - Mild thrombocytopenia.   COMMENT:   The marrow is normocellular but exhibits a few small abnormal lymphoid  aggregates. Flow cytometry reveals a monoclonal B-cell population with a  similar phenotype (no CD5 or CD10 expression) to that of the patient's  prior lymphoma. The overall findings are consistent with mild  involvement by the patient's prior non-Hodgkin B-cell lymphoma (splenic  marginal zone lymphoma). While there is no absolute lymphocytosis, there  are abnormal lymphocytes which likely represent peripheral blood  involvement.   07/05/2020 Cytogenetics Report    RADIOGRAPHIC STUDIES: ABDOMEN ULTRASOUND COMPLETE   COMPARISON:  CT 08/03/2016   FINDINGS: Gallbladder: No gallstones or wall thickening visualized. No sonographic Murphy sign noted by sonographer.   Common bile duct: Diameter: 1.8 mm.   Liver: No focal lesion identified. Within normal limits in parenchymal echogenicity. Portal vein is patent on color Doppler imaging with normal direction of blood flow towards the liver.   IVC: No abnormality visualized.   Pancreas: Visualized portion unremarkable.   Spleen: Mild to moderate splenomegaly as the spleen measures 12.9 cm in greatest diameter, although splenic volume is increased measuring 785 cubic cm.   Right Kidney: Length: 10.1 cm. Echogenicity within normal limits. No mass or hydronephrosis visualized.   Left Kidney: Length: 10.8 cm. Echogenicity within normal  limits. No mass or hydronephrosis visualized.   Abdominal aorta: No aneurysm visualized.   Other findings: None.   IMPRESSION: No acute findings.   Known mild to moderate splenomegaly with splenic volume 785 cubic cm.     Electronically Signed   By: Marin Olp M.D.   On: 11/30/2016 10:09   ASSESSMENT & PLAN:   66 y.o. 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 Diffusely hypermetabolic on PET/CT No evidence of liver disease on CT scan. No evidence of portal hypertension. 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 Platelets today have normalized to 155k  #3 Indetermine left renal lesion 1.5 cms in size ?complex cyst - will need monitoring. No reports of this on [place] ab in 11/2016.  #4 Severe anxiety- improved since initial treatment.   PLAN:  -Patient's labs done today were reviewed in detail.  CBC CMP and LDH stable  -Patient has no lab or clinical evidence of progression of her splenic marginal zone lymphoma. -No notable toxicities from her last dose of maintenance Rituxan. -Patient is appropriate to proceed  with her next cycle of maintenance Rituxan today with the same supportive medications. -Orders for Rituxan reviewed and signed. -Her last planned dose of Rituxan is in 2 months when she will complete 1 year of maintenance treatment. -We will repeat ultrasound of the abdomen to evaluate for splenic size prior to her next clinic visit in 2 months. -Follow-up with primary care physician for management of weight loss  FOLLOW UP: Ultrasound abdomen to evaluate for spleen size in 6 weeks Return to clinic in 2 months for her last planned cycle of maintenance Rituxan with labs and MD  visit  .The total time spent in the appointment was 20 minutes* .  All of the patient's questions were answered with apparent satisfaction. The patient knows to call the clinic with any problems, questions or concerns.   Sullivan Lone MD MS AAHIVMS [facility] Hematology/Oncology Physician [facility]  .*Total Encounter Time as defined by the Centers for Medicare and Medicaid Services includes, in addition to the face-to-face time of a patient visit (documented in the note above) non-face-to-face time: obtaining and reviewing outside history, ordering and reviewing medications, tests or procedures, care coordination (communications with other health care professionals or caregivers) and documentation in the medical record.

## 2021-06-28 DIAGNOSIS — M549 Dorsalgia, unspecified: Secondary | ICD-10-CM | POA: Diagnosis not present

## 2021-06-28 DIAGNOSIS — J029 Acute pharyngitis, unspecified: Secondary | ICD-10-CM | POA: Diagnosis not present

## 2021-06-28 DIAGNOSIS — R52 Pain, unspecified: Secondary | ICD-10-CM | POA: Diagnosis not present

## 2021-06-28 DIAGNOSIS — R350 Frequency of micturition: Secondary | ICD-10-CM | POA: Diagnosis not present

## 2021-06-28 DIAGNOSIS — M79674 Pain in right toe(s): Secondary | ICD-10-CM | POA: Diagnosis not present

## 2021-07-01 DIAGNOSIS — M2550 Pain in unspecified joint: Secondary | ICD-10-CM | POA: Diagnosis not present

## 2021-07-02 ENCOUNTER — Other Ambulatory Visit: Payer: Self-pay

## 2021-07-02 ENCOUNTER — Encounter (HOSPITAL_BASED_OUTPATIENT_CLINIC_OR_DEPARTMENT_OTHER): Payer: Self-pay

## 2021-07-02 ENCOUNTER — Emergency Department (HOSPITAL_BASED_OUTPATIENT_CLINIC_OR_DEPARTMENT_OTHER)
Admission: EM | Admit: 2021-07-02 | Discharge: 2021-07-02 | Disposition: A | Discharge status: Home / Self Care | Payer: BC Managed Care – PPO | Attending: Emergency Medicine | Admitting: Emergency Medicine

## 2021-07-02 ENCOUNTER — Emergency Department (HOSPITAL_BASED_OUTPATIENT_CLINIC_OR_DEPARTMENT_OTHER): Payer: BC Managed Care – PPO | Admitting: Radiology

## 2021-07-02 DIAGNOSIS — M79674 Pain in right toe(s): Secondary | ICD-10-CM | POA: Insufficient documentation

## 2021-07-02 DIAGNOSIS — N189 Chronic kidney disease, unspecified: Secondary | ICD-10-CM | POA: Diagnosis not present

## 2021-07-02 DIAGNOSIS — M79642 Pain in left hand: Secondary | ICD-10-CM | POA: Insufficient documentation

## 2021-07-02 DIAGNOSIS — M79675 Pain in left toe(s): Secondary | ICD-10-CM | POA: Insufficient documentation

## 2021-07-02 HISTORY — DX: Malignant (primary) neoplasm, unspecified: C80.1

## 2021-07-02 MED ORDER — NAPROXEN 375 MG PO TABS: 375.0000 mg | ORAL_TABLET | Freq: Two times a day (BID) | ORAL | 0 refills | Status: AC

## 2021-07-02 MED ORDER — PANTOPRAZOLE SODIUM 20 MG PO TBEC: 20.0000 mg | DELAYED_RELEASE_TABLET | Freq: Every day | ORAL | 1 refills | Status: AC

## 2021-07-02 MED ORDER — KETOROLAC TROMETHAMINE 60 MG/2ML IM SOLN
30.0000 mg | Freq: Once | INTRAMUSCULAR | Status: AC
Start: 2021-07-02 — End: 2021-07-02
  Administered 2021-07-02: 30 mg via INTRAMUSCULAR
  Filled 2021-07-02: qty 2

## 2021-07-02 NOTE — ED Provider Notes (Signed)
[facility] EMERGENCY DEPT Provider Note  CSN: [id number] Arrival date & time: 07/02/21 0220  Chief Complaint(s) Hand Pain  HPI Wendy Avery is a 66 y.o. female with a past medical history listed below including non-Hodgkin's lymphoma on rituximab who presents to the ED for worsening left flank pain.  She reports that she initially felt the pain about weeks ago after working out.  Pain was also felt in bilateral toes.  She has seen her primary care provider for this who prescribed her steroids.  Steroids seem to be working but tonight pain and left hand became severe   Hand Pain This is a new problem. Episode onset: 1.5 weeks. The problem occurs constantly. Progression since onset: fluctuating. Pertinent negatives include no chest pain, no abdominal pain, no headaches and no shortness of breath. The symptoms are aggravated by bending and twisting. Relieved by: steroids.    Past Medical History Past Medical History:  Diagnosis Date   Cancer [facility])    Chronic kidney disease    Heart murmur    Kidney stones 1999   Patient Active Problem List   Diagnosis Date Noted   Counseling regarding advance care planning and goals of care 06/23/2020   Splenic marginal zone b-cell lymphoma ([place]) 08/24/2016   Home Medication(s) Prior to Admission medications   Medication Sig Start Date End Date Taking? Authorizing Provider  naproxen (NAPROSYN) 375 MG tablet Take 1 tablet (375 mg total) by mouth 2 (two) times daily. 07/02/21  Yes Tonye Tancredi, Grayce Sessions, MD  pantoprazole (PROTONIX) 20 MG tablet Take 1 tablet (20 mg total) by mouth daily. 07/02/21 08/31/21 Yes Stacy Sailer, Grayce Sessions, MD  LORazepam (ATIVAN) 0.5 MG tablet Take 1 tablet (0.5 mg total) by mouth every 8 (eight) hours as needed for anxiety. 07/28/20   Brunetta Genera, MD                                                                                                                                     Allergies Penicillins  Review of Systems Review of Systems  Respiratory:  Negative for shortness of breath.   Cardiovascular:  Negative for chest pain.  Gastrointestinal:  Negative for abdominal pain.  Neurological:  Negative for headaches.   As noted in HPI  Physical Exam Vital Signs  I have reviewed the triage vital signs BP (!) 164/79 (BP Location: Right Arm)   Pulse 73   Temp 99.5 F (37.5 C) (Oral)   Resp 18   Ht 5' (1.524 m)   Wt 99.8 kg   SpO2 100%   BMI 42.97 kg/m   Physical Exam Vitals reviewed.  Constitutional:      General: She is not in acute distress.    Appearance: She is well-developed. She is not diaphoretic.  HENT:     Head: Normocephalic and atraumatic.     Right Ear: External ear normal.     Left Ear: External ear  normal.     Nose: Nose normal.  Eyes:     General: No scleral icterus.    Conjunctiva/sclera: Conjunctivae normal.  Neck:     Trachea: Phonation normal.  Cardiovascular:     Rate and Rhythm: Normal rate and regular rhythm.  Pulmonary:     Effort: Pulmonary effort is normal. No respiratory distress.     Breath sounds: No stridor.  Abdominal:     General: There is no distension.  Musculoskeletal:     Right hand: No swelling. Normal pulse.     Left hand: Swelling, tenderness and bony tenderness present. Decreased range of motion. Normal strength. Normal sensation. Normal pulse.     Cervical back: Normal range of motion.  Neurological:     Mental Status: She is alert and oriented to person, place, and time.  Psychiatric:        Behavior: Behavior normal.     ED Results and Treatments Labs (all labs ordered are listed, but only abnormal results are displayed) Labs Reviewed - No data to display                                                                                                                       EKG  EKG Interpretation  Date/Time:    Ventricular Rate:    PR Interval:    QRS Duration:   QT Interval:    QTC  Calculation:   R Axis:     Text Interpretation:         Radiology DG Hand 2 View Left  Result Date: 07/02/2021 CLINICAL DATA:  Left hand pain. EXAM: LEFT HAND - 2 VIEW COMPARISON:  None Available. FINDINGS: There is no evidence of fracture or dislocation. There is no evidence of arthropathy or other focal bone abnormality. Mild soft tissue swelling is present over the dorsum of the hand and wrist. IMPRESSION: No acute fracture or dislocation. Electronically Signed   By: Brett Fairy M.D.   On: 07/02/2021 04:45    Pertinent labs & imaging results that were available during my care of the patient were reviewed by me and considered in my medical decision making (see MDM for details).  Medications Ordered in ED Medications  ketorolac (TORADOL) injection 30 mg (30 mg Intramuscular Given 07/02/21 0416)  Procedures Procedures  (including critical care time)  Medical Decision Making / ED Course    Complexity of Problem:  Co-morbidities/SDOH that complicate the patient evaluation/care: Noted above in HPI  Patient's presenting problem/concern, DDX, and MDM listed below: Left hand pain Patient denied any overt trauma. Seems to be inflammatory arthritis related.  Low suspicion for septic arthritis.  Doubt DVT.  Doubt arterial occlusion.    Complexity of Data:     Imaging Studies ordered listed below with my independent interpretation: X-ray without evidence of acute fracture or dislocation.  It is notable for mild swelling in the dorsum of the hand.     ED Course:    Assessment, Add'l Intervention, and Reassessment: Left hand pain Feel this is more consistent with inflammatory arthritis. Pain significantly improved with Toradol She is already on steroids Will Rx short course of naproxen.  We will also give Protonix    Final Clinical Impression(s)  / ED Diagnoses Final diagnoses:  Pain of left hand   The patient appears reasonably screened and/or stabilized for discharge and I doubt any other medical condition or other [facility] requiring further screening, evaluation, or treatment in the ED at this time prior to discharge. Safe for discharge with strict return precautions.  Disposition: Discharge  Condition: Good  I have discussed the results, Dx and Tx plan with the patient/family who expressed understanding and agree(s) with the plan. Discharge instructions discussed at length. The patient/family was given strict return precautions who verbalized understanding of the instructions. No further questions at time of discharge.    ED Discharge Orders          Ordered    naproxen (NAPROSYN) 375 MG tablet  2 times daily        07/02/21 0629    pantoprazole (PROTONIX) 20 MG tablet  Daily        07/02/21 4235            Follow Up: [name], MD [address] [place] [address] [phone]  Call  to schedule an appointment for close follow up           This chart was dictated using voice recognition software.  Despite best efforts to proofread,  errors can occur which can change the documentation meaning.    Fatima Blank, MD 07/02/21 [phone]

## 2021-07-02 NOTE — ED Triage Notes (Signed)
States has been exercising and developed pain in multiple areas.Multiple complains Pain to feet and hands.  Pain to back.  Pain worse with swelling to left hand.  Seen in urgent care for this and started on steroids.  States pain to left hand getting worse.  Ambulatory slowly to room.  States seen doctor on Wednesday for UTI  On antibiotics.

## 2021-07-04 ENCOUNTER — Encounter: Payer: Self-pay | Admitting: Hematology

## 2021-07-06 DIAGNOSIS — Z01419 Encounter for gynecological examination (general) (routine) without abnormal findings: Secondary | ICD-10-CM | POA: Diagnosis not present

## 2021-07-06 DIAGNOSIS — N951 Menopausal and female climacteric states: Secondary | ICD-10-CM | POA: Diagnosis not present

## 2021-07-06 DIAGNOSIS — R87810 Cervical high risk human papillomavirus (HPV) DNA test positive: Secondary | ICD-10-CM | POA: Diagnosis not present

## 2021-07-06 DIAGNOSIS — Z130 Encounter for screening for diseases of the blood and blood-forming organs and certain disorders involving the immune mechanism: Secondary | ICD-10-CM | POA: Diagnosis not present

## 2021-07-06 DIAGNOSIS — Z1231 Encounter for screening mammogram for malignant neoplasm of breast: Secondary | ICD-10-CM | POA: Diagnosis not present

## 2021-07-11 ENCOUNTER — Other Ambulatory Visit: Payer: Self-pay | Admitting: Obstetrics and Gynecology

## 2021-07-11 DIAGNOSIS — M79672 Pain in left foot: Secondary | ICD-10-CM | POA: Diagnosis not present

## 2021-07-11 DIAGNOSIS — M79671 Pain in right foot: Secondary | ICD-10-CM | POA: Diagnosis not present

## 2021-07-11 DIAGNOSIS — M8580 Other specified disorders of bone density and structure, unspecified site: Secondary | ICD-10-CM

## 2021-07-11 DIAGNOSIS — E2839 Other primary ovarian failure: Secondary | ICD-10-CM

## 2021-07-12 DIAGNOSIS — C8580 Other specified types of non-Hodgkin lymphoma, unspecified site: Secondary | ICD-10-CM | POA: Diagnosis not present

## 2021-07-12 DIAGNOSIS — R600 Localized edema: Secondary | ICD-10-CM | POA: Diagnosis not present

## 2021-07-12 DIAGNOSIS — M2550 Pain in unspecified joint: Secondary | ICD-10-CM | POA: Diagnosis not present

## 2021-07-12 DIAGNOSIS — M79606 Pain in leg, unspecified: Secondary | ICD-10-CM | POA: Diagnosis not present

## 2021-07-13 ENCOUNTER — Other Ambulatory Visit (HOSPITAL_BASED_OUTPATIENT_CLINIC_OR_DEPARTMENT_OTHER): Payer: Self-pay | Admitting: Family Medicine

## 2021-07-13 ENCOUNTER — Ambulatory Visit (HOSPITAL_BASED_OUTPATIENT_CLINIC_OR_DEPARTMENT_OTHER): Payer: BC Managed Care – PPO

## 2021-07-13 ENCOUNTER — Ambulatory Visit (HOSPITAL_BASED_OUTPATIENT_CLINIC_OR_DEPARTMENT_OTHER)
Admission: RE | Admit: 2021-07-13 | Discharge: 2021-07-13 | Disposition: A | Discharge status: Home / Self Care | Payer: BC Managed Care – PPO | Source: Ambulatory Visit | Attending: Family Medicine | Admitting: Family Medicine

## 2021-07-13 DIAGNOSIS — M79669 Pain in unspecified lower leg: Secondary | ICD-10-CM | POA: Insufficient documentation

## 2021-07-13 DIAGNOSIS — M7989 Other specified soft tissue disorders: Secondary | ICD-10-CM

## 2021-07-13 DIAGNOSIS — M79605 Pain in left leg: Secondary | ICD-10-CM | POA: Diagnosis not present

## 2021-07-13 DIAGNOSIS — M79604 Pain in right leg: Secondary | ICD-10-CM | POA: Diagnosis not present

## 2021-07-17 ENCOUNTER — Encounter: Payer: Self-pay | Admitting: Hematology

## 2021-07-18 ENCOUNTER — Ambulatory Visit
Admission: RE | Admit: 2021-07-18 | Discharge: 2021-07-18 | Disposition: A | Discharge status: Home / Self Care | Payer: BC Managed Care – PPO | Source: Ambulatory Visit | Attending: Family Medicine | Admitting: Family Medicine

## 2021-07-18 ENCOUNTER — Other Ambulatory Visit: Payer: Self-pay | Admitting: Family Medicine

## 2021-07-18 DIAGNOSIS — R509 Fever, unspecified: Secondary | ICD-10-CM

## 2021-07-18 DIAGNOSIS — M2550 Pain in unspecified joint: Secondary | ICD-10-CM | POA: Diagnosis not present

## 2021-07-20 DIAGNOSIS — M25571 Pain in right ankle and joints of right foot: Secondary | ICD-10-CM | POA: Diagnosis not present

## 2021-07-20 DIAGNOSIS — M25572 Pain in left ankle and joints of left foot: Secondary | ICD-10-CM | POA: Diagnosis not present

## 2021-07-20 DIAGNOSIS — M25561 Pain in right knee: Secondary | ICD-10-CM | POA: Diagnosis not present

## 2021-07-20 DIAGNOSIS — M25562 Pain in left knee: Secondary | ICD-10-CM | POA: Diagnosis not present

## 2021-07-20 DIAGNOSIS — R21 Rash and other nonspecific skin eruption: Secondary | ICD-10-CM | POA: Diagnosis not present

## 2021-07-20 DIAGNOSIS — M1990 Unspecified osteoarthritis, unspecified site: Secondary | ICD-10-CM | POA: Diagnosis not present

## 2021-07-20 DIAGNOSIS — M79671 Pain in right foot: Secondary | ICD-10-CM | POA: Diagnosis not present

## 2021-07-20 DIAGNOSIS — M3390 Dermatopolymyositis, unspecified, organ involvement unspecified: Secondary | ICD-10-CM | POA: Diagnosis not present

## 2021-07-20 DIAGNOSIS — M791 Myalgia, unspecified site: Secondary | ICD-10-CM | POA: Diagnosis not present

## 2021-07-20 DIAGNOSIS — M79672 Pain in left foot: Secondary | ICD-10-CM | POA: Diagnosis not present

## 2021-07-26 DIAGNOSIS — M3390 Dermatopolymyositis, unspecified, organ involvement unspecified: Secondary | ICD-10-CM | POA: Diagnosis not present

## 2021-07-26 DIAGNOSIS — M8589 Other specified disorders of bone density and structure, multiple sites: Secondary | ICD-10-CM | POA: Diagnosis not present

## 2021-07-26 DIAGNOSIS — Z7952 Long term (current) use of systemic steroids: Secondary | ICD-10-CM | POA: Diagnosis not present

## 2021-07-26 DIAGNOSIS — M1990 Unspecified osteoarthritis, unspecified site: Secondary | ICD-10-CM | POA: Diagnosis not present

## 2021-07-26 DIAGNOSIS — M8580 Other specified disorders of bone density and structure, unspecified site: Secondary | ICD-10-CM | POA: Diagnosis not present

## 2021-08-01 ENCOUNTER — Ambulatory Visit (HOSPITAL_COMMUNITY)
Admission: RE | Admit: 2021-08-01 | Discharge: 2021-08-01 | Disposition: A | Discharge status: Home / Self Care | Payer: BC Managed Care – PPO | Source: Ambulatory Visit | Attending: Hematology | Admitting: Hematology

## 2021-08-01 DIAGNOSIS — Z5112 Encounter for antineoplastic immunotherapy: Secondary | ICD-10-CM | POA: Insufficient documentation

## 2021-08-01 DIAGNOSIS — Z8572 Personal history of non-Hodgkin lymphomas: Secondary | ICD-10-CM | POA: Diagnosis not present

## 2021-08-01 DIAGNOSIS — N281 Cyst of kidney, acquired: Secondary | ICD-10-CM | POA: Diagnosis not present

## 2021-08-01 DIAGNOSIS — K7689 Other specified diseases of liver: Secondary | ICD-10-CM | POA: Diagnosis not present

## 2021-08-01 DIAGNOSIS — C8307 Small cell B-cell lymphoma, spleen: Secondary | ICD-10-CM | POA: Insufficient documentation

## 2021-08-02 ENCOUNTER — Telehealth: Payer: Self-pay | Admitting: Hematology

## 2021-08-02 NOTE — Telephone Encounter (Signed)
Rescheduled upcoming appointment due to provider's template. Patient is aware of changes. 

## 2021-08-14 ENCOUNTER — Other Ambulatory Visit: Payer: Self-pay

## 2021-08-15 ENCOUNTER — Encounter: Payer: Self-pay | Admitting: Hematology

## 2021-08-17 ENCOUNTER — Ambulatory Visit: Payer: BC Managed Care – PPO | Admitting: Hematology

## 2021-08-17 ENCOUNTER — Other Ambulatory Visit: Payer: BC Managed Care – PPO

## 2021-08-17 ENCOUNTER — Ambulatory Visit: Payer: BC Managed Care – PPO

## 2021-08-18 ENCOUNTER — Inpatient Hospital Stay (HOSPITAL_BASED_OUTPATIENT_CLINIC_OR_DEPARTMENT_OTHER): Payer: BC Managed Care – PPO | Admitting: Hematology

## 2021-08-18 ENCOUNTER — Other Ambulatory Visit: Payer: Self-pay

## 2021-08-18 ENCOUNTER — Other Ambulatory Visit: Payer: Self-pay | Admitting: Hematology

## 2021-08-18 ENCOUNTER — Inpatient Hospital Stay: Payer: BC Managed Care – PPO

## 2021-08-18 ENCOUNTER — Inpatient Hospital Stay: Payer: BC Managed Care – PPO | Attending: Hematology

## 2021-08-18 VITALS — BP 124/52 | HR 64 | Temp 98.0°F | Resp 18 | Wt 225.0 lb

## 2021-08-18 DIAGNOSIS — Z5112 Encounter for antineoplastic immunotherapy: Secondary | ICD-10-CM | POA: Diagnosis not present

## 2021-08-18 DIAGNOSIS — Z79899 Other long term (current) drug therapy: Secondary | ICD-10-CM | POA: Insufficient documentation

## 2021-08-18 DIAGNOSIS — C8307 Small cell B-cell lymphoma, spleen: Secondary | ICD-10-CM | POA: Insufficient documentation

## 2021-08-18 DIAGNOSIS — Z7189 Other specified counseling: Secondary | ICD-10-CM

## 2021-08-18 LAB — CMP (CANCER CENTER ONLY)
ALT: 30 U/L (ref 0–44)
AST: 14 U/L — ABNORMAL LOW (ref 15–41)
Albumin: 3.9 g/dL (ref 3.5–5.0)
Alkaline Phosphatase: 61 U/L (ref 38–126)
Anion gap: 6 (ref 5–15)
BUN: 26 mg/dL — ABNORMAL HIGH (ref 8–23)
CO2: 28 mmol/L (ref 22–32)
Calcium: 9.2 mg/dL (ref 8.9–10.3)
Chloride: 104 mmol/L (ref 98–111)
Creatinine: 0.76 mg/dL (ref 0.44–1.00)
GFR, Estimated: >60 mL/min (ref >60)
Glucose, Bld: 96 mg/dL (ref 70–99)
Potassium: 3.8 mmol/L (ref 3.5–5.1)
Sodium: 138 mmol/L (ref 135–145)
Total Bilirubin: 0.8 mg/dL (ref 0.3–1.2)
Total Protein: 6 g/dL — ABNORMAL LOW (ref 6.5–8.1)

## 2021-08-18 LAB — CBC WITH DIFFERENTIAL (CANCER CENTER ONLY)
Abs Immature Granulocytes: 0.1 10*3/uL — ABNORMAL HIGH (ref 0.00–0.07)
Basophils Absolute: 0 10*3/uL (ref 0.0–0.1)
Basophils Relative: 0 %
Eosinophils Absolute: 0.2 10*3/uL (ref 0.0–0.5)
Eosinophils Relative: 1 %
HCT: 39.5 % (ref 36.0–46.0)
Hemoglobin: 13.4 g/dL (ref 12.0–15.0)
Immature Granulocytes: 1 %
Lymphocytes Relative: 18 %
Lymphs Abs: 2.5 10*3/uL (ref 0.7–4.0)
MCH: 28.9 pg (ref 26.0–34.0)
MCHC: 33.9 g/dL (ref 30.0–36.0)
MCV: 85.1 fL (ref 80.0–100.0)
Monocytes Absolute: 1 10*3/uL (ref 0.1–1.0)
Monocytes Relative: 8 %
Neutro Abs: 9.6 10*3/uL — ABNORMAL HIGH (ref 1.7–7.7)
Neutrophils Relative %: 72 %
Platelet Count: 163 10*3/uL (ref 150–400)
RBC: 4.64 MIL/uL (ref 3.87–5.11)
RDW: 14 % (ref 11.5–15.5)
WBC Count: 13.4 10*3/uL — ABNORMAL HIGH (ref 4.0–10.5)
nRBC: 0 % (ref 0.0–0.2)

## 2021-08-18 LAB — LACTATE DEHYDROGENASE: LDH: 193 U/L — ABNORMAL HIGH (ref 98–192)

## 2021-08-18 MED ORDER — FAMOTIDINE IN NACL 20-0.9 MG/50ML-% IV SOLN
20.0000 mg | Freq: Once | INTRAVENOUS | Status: AC
  Administered 2021-08-18: 20 mg via INTRAVENOUS
  Filled 2021-08-18: qty 50

## 2021-08-18 MED ORDER — SODIUM CHLORIDE 0.9% FLUSH: 10.0000 mL | INTRAVENOUS | Status: DC | PRN

## 2021-08-18 MED ORDER — ACETAMINOPHEN 325 MG PO TABS
650.0000 mg | ORAL_TABLET | Freq: Once | ORAL | Status: AC
  Administered 2021-08-18: 650 mg via ORAL
  Filled 2021-08-18: qty 2

## 2021-08-18 MED ORDER — SODIUM CHLORIDE 0.9 % IV SOLN: Freq: Once | INTRAVENOUS | Status: AC

## 2021-08-18 MED ORDER — METHYLPREDNISOLONE SODIUM SUCC 125 MG IJ SOLR
125.0000 mg | Freq: Once | INTRAMUSCULAR | Status: AC
  Administered 2021-08-18: 125 mg via INTRAVENOUS
  Filled 2021-08-18: qty 2

## 2021-08-18 MED ORDER — SODIUM CHLORIDE 0.9 % IV SOLN
375.0000 mg/m2 | Freq: Once | INTRAVENOUS | Status: AC
  Administered 2021-08-18: 100 mg via INTRAVENOUS
  Filled 2021-08-18: qty 50

## 2021-08-18 MED ORDER — HEPARIN SOD (PORK) LOCK FLUSH 100 UNIT/ML IV SOLN: 500.0000 [IU] | Freq: Once | INTRAVENOUS | Status: DC | PRN

## 2021-08-18 MED ORDER — LORAZEPAM 1 MG PO TABS
0.5000 mg | ORAL_TABLET | Freq: Once | ORAL | Status: AC
  Administered 2021-08-18: 0.5 mg via ORAL
  Filled 2021-08-18: qty 1

## 2021-08-18 MED ORDER — DIPHENHYDRAMINE HCL 25 MG PO CAPS
50.0000 mg | ORAL_CAPSULE | Freq: Once | ORAL | Status: AC
  Administered 2021-08-18: 50 mg via ORAL
  Filled 2021-08-18: qty 2

## 2021-08-18 NOTE — Patient Instructions (Signed)
[facility] MEDICAL ONCOLOGY  Discharge Instructions: ?Thank you for choosing [facility] to provide your oncology and hematology care.  ? ?If you have a lab appointment with the Cancer Center, please go directly to the Cancer Center and check in at the registration area. ?  ?Wear comfortable clothing and clothing appropriate for easy access to any Portacath or PICC line.  ? ?We strive to give you quality time with your provider. You may need to reschedule your appointment if you arrive late (15 or more minutes).  Arriving late affects you and other patients whose appointments are after yours.  Also, if you miss three or more appointments without notifying the office, you may be dismissed from the clinic at the provider?s discretion.    ?  ?For prescription refill requests, have your pharmacy contact our office and allow 72 hours for refills to be completed.   ? ?Today you received the following chemotherapy and/or immunotherapy agents: Ruxience ?  ?To help prevent nausea and vomiting after your treatment, we encourage you to take your nausea medication as directed. ? ?BELOW ARE SYMPTOMS THAT SHOULD BE REPORTED IMMEDIATELY: ?*FEVER GREATER THAN 100.4 F (38 ?C) OR HIGHER ?*CHILLS OR SWEATING ?*NAUSEA AND VOMITING THAT IS NOT CONTROLLED WITH YOUR NAUSEA MEDICATION ?*UNUSUAL SHORTNESS OF BREATH ?*UNUSUAL BRUISING OR BLEEDING ?*URINARY PROBLEMS (pain or burning when urinating, or frequent urination) ?*BOWEL PROBLEMS (unusual diarrhea, constipation, pain near the anus) ?TENDERNESS IN MOUTH AND THROAT WITH OR WITHOUT PRESENCE OF ULCERS (sore throat, sores in mouth, or a toothache) ?UNUSUAL RASH, SWELLING OR PAIN  ?UNUSUAL VAGINAL DISCHARGE OR ITCHING  ? ?Items with * indicate a potential emergency and should be followed up as soon as possible or go to the Emergency Department if any problems should occur. ? ?Please show the CHEMOTHERAPY ALERT CARD or IMMUNOTHERAPY ALERT CARD at check-in to the  Emergency Department and triage nurse. ? ?Should you have questions after your visit or need to cancel or reschedule your appointment, please contact [facility] MEDICAL ONCOLOGY  Dept: [phone]  and follow the prompts.  Office hours are 8:00 a.m. to 4:30 p.m. Monday - Friday. Please note that voicemails left after 4:00 p.m. may not be returned until the following business day.  We are closed weekends and major holidays. You have access to a nurse at all times for urgent questions. Please call the main number to the clinic Dept: [phone] and follow the prompts. ? ? ?For any non-urgent questions, you may also contact your provider using MyChart. We now offer e-Visits for anyone 18 and older to request care online for non-urgent symptoms. For details visit [email or web address]. ?  ?Also download the MyChart app! Go to the app store, search "MyChart", open the app, select [facility], and log in with your MyChart username and password. ? ?Due to Covid, a mask is required upon entering the hospital/clinic. If you do not have a mask, one will be given to you upon arrival. For doctor visits, patients may have 1 support Yosmar Ryker aged 18 or older with them. For treatment visits, patients cannot have anyone with them due to current Covid guidelines and our immunocompromised population.  ? ?

## 2021-08-18 NOTE — Progress Notes (Signed)
HEMATOLOGY/ONCOLOGY CLINIC NOTE  Date of Service: 08/18/2021   Patient Care Team: [name], MD as PCP - General (Family Medicine)  PCP: [name] MD   CHIEF COMPLAINTS/PURPOSE OF CONSULTATION: Follow-up for continued evaluation and management of relapsed splenic marginal zone lymphoma and next cycle of maintenance Rituxan.  HISTORY OF PRESENTING ILLNESS:   Please see previous note for details on initial presentation  INTERVAL HISTORY Wendy Avery is a 66 y.o. female here for continued evaluation and management of her relapsed splenic marginal zone lymphoma and prior to her next cycle of maintenance Rituxan immunotherapy. She reports She is doing well with no new symptoms or concerns.  She continues to follow up with rheumatology for possible rheumatoid arthritis.   She reports recent positive HPV testing and notes that she has a biopsy scheduled 08/22/2021.  We discussed [place] abd done 08/01/2021  No fevers no chills no night sweats. No new chest pain or shortness of breath. No ocular pain or change in visual acuity. No Notable toxicities from her last dose of Rituxan.    Labs done today were reviewed in detail.  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 #3 Right Shoulder excision of basal cell carcinoma in 2005   SOCIAL HISTORY: Social History   Socioeconomic History   Marital status: Widowed    Spouse name: Not on file   Number of children: Not on file   Years of education: Not on file   Highest education level: Not on file  Occupational History   Not on file  Tobacco Use   Smoking status: Former    Types: Cigarettes    Quit date: 2010    Years since quitting: 13.5   Smokeless tobacco: Never  Vaping Use   Vaping Use: Never used  Substance and Sexual Activity    Alcohol use: Yes    Comment: occassional    Drug use: No   Sexual activity: Not on file  Other Topics Concern   Not on file  Social History Narrative   Not on file   Social Determinants of Health   Financial Resource Strain: Not on file  Food Insecurity: Not on file  Transportation Needs: Not on file  Physical Activity: Not on file  Stress: Not on file  Social Connections: Not on file  Intimate Partner Violence: Not on file  Ex-smoker 1 pack per day quit 30 years ago Uses alcohol socially She is widowed and has 2 daughters   FAMILY HISTORY: Mom had breast cancer at age 23 years and died from metastatic disease Father skin cancer, vocal cord cancer, alcohol abuse  ALLERGIES:  ?PCN  MEDICATIONS:  Current Outpatient Medications  Medication Sig Dispense Refill   LORazepam (ATIVAN) 0.5 MG tablet Take 1 tablet (0.5 mg total) by mouth every 8 (eight) hours as needed for anxiety. 30 tablet 0   naproxen (NAPROSYN) 375 MG tablet Take 1 tablet (375 mg total) by mouth 2 (two) times daily. 20 tablet 0   pantoprazole (PROTONIX) 20 MG tablet Take 1 tablet (20 mg total) by mouth daily. 30 tablet 1   No current facility-administered medications for this visit.   Facility-Administered Medications Ordered in Other Visits  Medication Dose Route Frequency Provider Last Rate Last Admin   acetaminophen (TYLENOL) tablet 650 mg  650 mg Oral Once [place],  Cloria Spring, MD       diphenhydrAMINE (BENADRYL) capsule 50 mg  50 mg Oral Once Brunetta Genera, MD       famotidine (PEPCID) IVPB 20 mg premix  20 mg Intravenous Once Brunetta Genera, MD       heparin lock flush 100 unit/mL  500 Units Intracatheter Once PRN Brunetta Genera, MD       LORazepam (ATIVAN) tablet 0.5 mg  0.5 mg Oral Once Brunetta Genera, MD       methylPREDNISolone sodium succinate (SOLU-MEDROL) 125 mg/2 mL injection 125 mg  125 mg Intravenous Once Brunetta Genera, MD       riTUXimab-pvvr (RUXIENCE) 800 mg  in sodium chloride 0.9 % 250 mL (2.4242 mg/mL) infusion  375 mg/m2 (Treatment Plan Recorded) Intravenous Once Brunetta Genera, MD       sodium chloride flush (NS) 0.9 % injection 10 mL  10 mL Intracatheter PRN Brunetta Genera, MD        REVIEW OF SYSTEMS:   10 Point review of Systems was done is negative except as noted above.  PHYSICAL EXAMINATION: ECOG FS:1 - Symptomatic but completely ambulatory  There were no vitals filed for this visit.   Wt Readings from Last 3 Encounters:  08/18/21 225 lb (102.1 kg)  07/02/21 220 lb (99.8 kg)  06/15/21 224 lb 9.6 oz (101.9 kg)   There is no height or weight on file to calculate BMI.   NAD GENERAL:alert, in no acute distress and comfortable SKIN: no acute rashes, no significant lesions EYES: conjunctiva are pink and non-injected, sclera anicteric OROPHARYNX: MMM, no exudates, no oropharyngeal erythema or ulceration NECK: supple, no JVD LYMPH:  no palpable lymphadenopathy in the cervical, axillary or inguinal regions LUNGS: clear to auscultation b/l with normal respiratory effort HEART: regular rate & rhythm ABDOMEN:  normoactive bowel sounds , non tender, not distended. Extremity: no pedal edema PSYCH: alert & oriented x 3 with fluent speech NEURO: no focal motor/sensory deficits  Exam performed in chair.  LABORATORY DATA:  I have reviewed the data as listed     Latest Ref Rng & Units 08/18/2021   10:56 AM 06/15/2021    9:13 AM 04/12/2021    9:36 AM  CBC  WBC 4.0 - 10.5 K/uL 13.4  6.7  6.0   Hemoglobin 12.0 - 15.0 g/dL 13.4  14.3  14.5   Hematocrit 36.0 - 46.0 % 39.5  42.2  43.5   Platelets 150 - 400 K/uL 163  164  173    CBC    Component Value Date/Time   WBC 13.4 (H) 08/18/2021 1056   WBC 5.8 07/05/2020 0900   RBC 4.64 08/18/2021 1056   HGB 13.4 08/18/2021 1056   HGB 15.0 01/03/2017 1138   HCT 39.5 08/18/2021 1056   HCT 44.4 01/03/2017 1138   PLT 163 08/18/2021 1056   PLT 169 01/03/2017 1138   MCV 85.1  08/18/2021 1056   MCV 86.9 01/03/2017 1138   MCH 28.9 08/18/2021 1056   MCHC 33.9 08/18/2021 1056   RDW 14.0 08/18/2021 1056   RDW 12.9 01/03/2017 1138   LYMPHSABS 2.5 08/18/2021 1056   LYMPHSABS 1.8 01/03/2017 1138   MONOABS 1.0 08/18/2021 1056   MONOABS 0.4 01/03/2017 1138   EOSABS 0.2 08/18/2021 1056   EOSABS 0.2 01/03/2017 1138   BASOSABS 0.0 08/18/2021 1056   BASOSABS 0.0 01/03/2017 1138    .    Latest Ref Rng & Units 08/18/2021  10:56 AM 06/15/2021    9:13 AM 04/12/2021    9:36 AM  CMP  Glucose 70 - 99 mg/dL 96  164  111   BUN 8 - 23 mg/dL '26  19  20   '$ Creatinine 0.44 - 1.00 mg/dL 0.76  0.75  0.73   Sodium 135 - 145 mmol/L 138  142  142   Potassium 3.5 - 5.1 mmol/L 3.8  3.9  4.1   Chloride 98 - 111 mmol/L 104  108  108   CO2 22 - 32 mmol/L '28  27  27   '$ Calcium 8.9 - 10.3 mg/dL 9.2  9.1  9.5   Total Protein 6.5 - 8.1 g/dL 6.0  6.3  6.5   Total Bilirubin 0.3 - 1.2 mg/dL 0.8  0.6  0.8   Alkaline Phos 38 - 126 U/L 61  83  82   AST 15 - 41 U/L '14  18  21   '$ ALT 0 - 44 U/L '30  27  31     '$ Lab Results  Component Value Date   LDH 193 (H) 08/18/2021    ...   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   PERIPHERAL BLOOD:  - Abnormal lymphocytes present.  - Mild thrombocytopenia.   COMMENT:   The marrow is normocellular but exhibits a few small abnormal lymphoid  aggregates. Flow cytometry reveals a monoclonal B-cell population with a   similar phenotype (no CD5 or CD10 expression) to that of the patient's  prior lymphoma. The overall findings are consistent with mild  involvement by the patient's prior non-Hodgkin B-cell lymphoma (splenic  marginal zone lymphoma). While there is no absolute lymphocytosis, there  are abnormal lymphocytes which likely represent peripheral blood  involvement.   07/05/2020 Cytogenetics Report    RADIOGRAPHIC STUDIES: ABDOMEN ULTRASOUND COMPLETE   COMPARISON:  CT 08/03/2016   FINDINGS: Gallbladder: No gallstones or wall thickening visualized. No sonographic Murphy sign noted by sonographer.   Common bile duct: Diameter: 1.8 mm.   Liver: No focal lesion identified. Within normal limits in parenchymal echogenicity. Portal vein is patent on color Doppler imaging with normal direction of blood flow towards the liver.   IVC: No abnormality visualized.   Pancreas: Visualized portion unremarkable.   Spleen: Mild to moderate splenomegaly as the spleen measures 12.9 cm in greatest diameter, although splenic volume is increased measuring 785 cubic cm.   Right Kidney: Length: 10.1 cm. Echogenicity within normal limits. No mass or hydronephrosis visualized.   Left Kidney: Length: 10.8 cm. Echogenicity within normal limits. No mass or hydronephrosis visualized.   Abdominal aorta: No aneurysm visualized.   Other findings: None.   IMPRESSION: No acute findings.  Known mild to moderate splenomegaly with splenic volume 785 cubic cm.     Electronically Signed   By: Marin Olp M.D.   On: 11/30/2016 10:09   ASSESSMENT & PLAN:   66 y.o. 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 Diffusely hypermetabolic on PET/CT No evidence of liver disease on CT scan. No evidence of portal hypertension. 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 Platelets today have normalized to 155k  #3 Indetermine left renal lesion 1.5 cms in size ?complex cyst - will need monitoring. No reports of this on [place] ab in 11/2016.  #4 Severe anxiety- improved since initial treatment.   PLAN:  -Patient's labs done today were reviewed in detail.  CBC CMP and LDH stable  -Patient has no lab or clinical evidence of progression of her splenic marginal zone lymphoma. -No notable toxicities from her last dose of maintenance Rituxan. -Patient is appropriate to proceed with her next cycle of maintenance Rituxan today with the same supportive medications. -Orders for Rituxan reviewed and signed. -Her last planned dose of Rituxan is in 2 months when she will complete 1 year of maintenance treatment. -Follow-up with primary care physician for management of weight loss -She continues to follow up with rheumatology for possible rheumatoid arthritis.  -She reports recent positive HPV testing and notes that she has a biopsy scheduled 08/22/2021. -We discussed [place] abd done 08/01/2021 which revealed decrease in spleen size.   FOLLOW UP: ***  .The total time spent in the appointment was *** minutes* .  All of the patient's questions were answered with apparent satisfaction. The patient knows to call the clinic with any problems, questions or concerns.   Sullivan Lone MD MS AAHIVMS [facility] Hematology/Oncology Physician [facility]  .*Total Encounter Time as defined by the Centers for Medicare and Medicaid Services includes, in addition to the face-to-face time of a patient visit (documented in the note above) non-face-to-face time: obtaining and reviewing outside history, ordering and reviewing medications, tests or procedures, care coordination (communications with other health care professionals or  caregivers) and documentation in the medical record.  I, Melene Muller, am acting as scribe for Dr. Sullivan Lone, MD.

## 2021-08-21 ENCOUNTER — Encounter: Payer: Self-pay | Admitting: Hematology

## 2021-08-22 DIAGNOSIS — N72 Inflammatory disease of cervix uteri: Secondary | ICD-10-CM | POA: Diagnosis not present

## 2021-08-22 DIAGNOSIS — N879 Dysplasia of cervix uteri, unspecified: Secondary | ICD-10-CM | POA: Diagnosis not present

## 2021-08-22 DIAGNOSIS — R87810 Cervical high risk human papillomavirus (HPV) DNA test positive: Secondary | ICD-10-CM | POA: Diagnosis not present

## 2021-08-23 ENCOUNTER — Telehealth: Payer: Self-pay | Admitting: Hematology

## 2021-08-23 NOTE — Telephone Encounter (Signed)
Scheduled follow-up appointment per 7/28 los. Patient is aware. 

## 2021-08-24 ENCOUNTER — Other Ambulatory Visit: Payer: Self-pay

## 2021-08-24 ENCOUNTER — Encounter: Payer: Self-pay | Admitting: Hematology

## 2021-08-31 DIAGNOSIS — M8580 Other specified disorders of bone density and structure, unspecified site: Secondary | ICD-10-CM | POA: Diagnosis not present

## 2021-08-31 DIAGNOSIS — Z7952 Long term (current) use of systemic steroids: Secondary | ICD-10-CM | POA: Diagnosis not present

## 2021-08-31 DIAGNOSIS — M1990 Unspecified osteoarthritis, unspecified site: Secondary | ICD-10-CM | POA: Diagnosis not present

## 2021-08-31 DIAGNOSIS — M3390 Dermatopolymyositis, unspecified, organ involvement unspecified: Secondary | ICD-10-CM | POA: Diagnosis not present

## 2021-09-07 DIAGNOSIS — E785 Hyperlipidemia, unspecified: Secondary | ICD-10-CM | POA: Diagnosis not present

## 2021-09-07 DIAGNOSIS — E559 Vitamin D deficiency, unspecified: Secondary | ICD-10-CM | POA: Diagnosis not present

## 2021-09-07 DIAGNOSIS — C8580 Other specified types of non-Hodgkin lymphoma, unspecified site: Secondary | ICD-10-CM | POA: Diagnosis not present

## 2021-09-07 DIAGNOSIS — R7309 Other abnormal glucose: Secondary | ICD-10-CM | POA: Diagnosis not present

## 2021-09-23 ENCOUNTER — Other Ambulatory Visit: Payer: Self-pay

## 2021-10-10 DIAGNOSIS — E559 Vitamin D deficiency, unspecified: Secondary | ICD-10-CM | POA: Diagnosis not present

## 2021-10-10 DIAGNOSIS — E785 Hyperlipidemia, unspecified: Secondary | ICD-10-CM | POA: Diagnosis not present

## 2021-10-10 DIAGNOSIS — E8889 Other specified metabolic disorders: Secondary | ICD-10-CM | POA: Diagnosis not present

## 2021-10-10 DIAGNOSIS — R5383 Other fatigue: Secondary | ICD-10-CM | POA: Diagnosis not present

## 2021-10-10 DIAGNOSIS — Z1331 Encounter for screening for depression: Secondary | ICD-10-CM | POA: Diagnosis not present

## 2021-10-10 DIAGNOSIS — R7303 Prediabetes: Secondary | ICD-10-CM | POA: Diagnosis not present

## 2021-10-17 DIAGNOSIS — M8580 Other specified disorders of bone density and structure, unspecified site: Secondary | ICD-10-CM | POA: Diagnosis not present

## 2021-10-17 DIAGNOSIS — M3390 Dermatopolymyositis, unspecified, organ involvement unspecified: Secondary | ICD-10-CM | POA: Diagnosis not present

## 2021-10-17 DIAGNOSIS — Z7952 Long term (current) use of systemic steroids: Secondary | ICD-10-CM | POA: Diagnosis not present

## 2021-10-17 DIAGNOSIS — M1990 Unspecified osteoarthritis, unspecified site: Secondary | ICD-10-CM | POA: Diagnosis not present

## 2021-10-25 DIAGNOSIS — E785 Hyperlipidemia, unspecified: Secondary | ICD-10-CM | POA: Diagnosis not present

## 2021-10-25 DIAGNOSIS — R7303 Prediabetes: Secondary | ICD-10-CM | POA: Diagnosis not present

## 2021-11-23 DIAGNOSIS — K589 Irritable bowel syndrome without diarrhea: Secondary | ICD-10-CM | POA: Diagnosis not present

## 2021-11-23 DIAGNOSIS — C8580 Other specified types of non-Hodgkin lymphoma, unspecified site: Secondary | ICD-10-CM | POA: Diagnosis not present

## 2021-11-29 DIAGNOSIS — C8580 Other specified types of non-Hodgkin lymphoma, unspecified site: Secondary | ICD-10-CM | POA: Diagnosis not present

## 2021-11-29 DIAGNOSIS — M799 Soft tissue disorder, unspecified: Secondary | ICD-10-CM | POA: Diagnosis not present

## 2021-11-29 DIAGNOSIS — Z23 Encounter for immunization: Secondary | ICD-10-CM | POA: Diagnosis not present

## 2021-12-08 ENCOUNTER — Other Ambulatory Visit: Payer: Self-pay | Admitting: Family Medicine

## 2021-12-08 DIAGNOSIS — M799 Soft tissue disorder, unspecified: Secondary | ICD-10-CM

## 2021-12-13 DIAGNOSIS — D123 Benign neoplasm of transverse colon: Secondary | ICD-10-CM | POA: Diagnosis not present

## 2021-12-13 DIAGNOSIS — D120 Benign neoplasm of cecum: Secondary | ICD-10-CM | POA: Diagnosis not present

## 2021-12-13 DIAGNOSIS — K648 Other hemorrhoids: Secondary | ICD-10-CM | POA: Diagnosis not present

## 2021-12-13 DIAGNOSIS — D128 Benign neoplasm of rectum: Secondary | ICD-10-CM | POA: Diagnosis not present

## 2021-12-13 DIAGNOSIS — Z1211 Encounter for screening for malignant neoplasm of colon: Secondary | ICD-10-CM | POA: Diagnosis not present

## 2021-12-13 DIAGNOSIS — K6389 Other specified diseases of intestine: Secondary | ICD-10-CM | POA: Diagnosis not present

## 2021-12-26 ENCOUNTER — Ambulatory Visit
Admission: RE | Admit: 2021-12-26 | Discharge: 2021-12-26 | Disposition: A | Discharge status: Home / Self Care | Payer: BC Managed Care – PPO | Source: Ambulatory Visit | Attending: Family Medicine | Admitting: Family Medicine

## 2021-12-26 DIAGNOSIS — R2232 Localized swelling, mass and lump, left upper limb: Secondary | ICD-10-CM | POA: Diagnosis not present

## 2021-12-26 DIAGNOSIS — M799 Soft tissue disorder, unspecified: Secondary | ICD-10-CM

## 2022-01-02 ENCOUNTER — Other Ambulatory Visit: Payer: BC Managed Care – PPO

## 2022-01-25 DIAGNOSIS — N72 Inflammatory disease of cervix uteri: Secondary | ICD-10-CM | POA: Diagnosis not present

## 2022-01-25 DIAGNOSIS — L680 Hirsutism: Secondary | ICD-10-CM | POA: Diagnosis not present

## 2022-01-25 DIAGNOSIS — N393 Stress incontinence (female) (male): Secondary | ICD-10-CM | POA: Diagnosis not present

## 2022-01-25 DIAGNOSIS — N898 Other specified noninflammatory disorders of vagina: Secondary | ICD-10-CM | POA: Diagnosis not present

## 2022-01-30 DIAGNOSIS — J111 Influenza due to unidentified influenza virus with other respiratory manifestations: Secondary | ICD-10-CM | POA: Diagnosis not present

## 2022-01-30 DIAGNOSIS — Z20822 Contact with and (suspected) exposure to covid-19: Secondary | ICD-10-CM | POA: Diagnosis not present

## 2022-01-30 DIAGNOSIS — I10 Essential (primary) hypertension: Secondary | ICD-10-CM | POA: Diagnosis not present

## 2022-02-08 DIAGNOSIS — M1990 Unspecified osteoarthritis, unspecified site: Secondary | ICD-10-CM | POA: Diagnosis not present

## 2022-02-08 DIAGNOSIS — M3390 Dermatopolymyositis, unspecified, organ involvement unspecified: Secondary | ICD-10-CM | POA: Diagnosis not present

## 2022-02-08 DIAGNOSIS — M8580 Other specified disorders of bone density and structure, unspecified site: Secondary | ICD-10-CM | POA: Diagnosis not present

## 2022-02-08 DIAGNOSIS — Z7952 Long term (current) use of systemic steroids: Secondary | ICD-10-CM | POA: Diagnosis not present

## 2022-02-15 ENCOUNTER — Other Ambulatory Visit: Payer: Self-pay

## 2022-02-15 DIAGNOSIS — C8307 Small cell B-cell lymphoma, spleen: Secondary | ICD-10-CM

## 2022-02-15 DIAGNOSIS — Z7189 Other specified counseling: Secondary | ICD-10-CM

## 2022-02-16 ENCOUNTER — Inpatient Hospital Stay: Payer: BC Managed Care – PPO | Attending: Hematology | Admitting: Hematology

## 2022-02-16 ENCOUNTER — Other Ambulatory Visit: Payer: Self-pay

## 2022-02-16 ENCOUNTER — Inpatient Hospital Stay: Payer: BC Managed Care – PPO

## 2022-02-16 VITALS — BP 127/48 | HR 62 | Temp 97.3°F | Resp 20 | Wt 229.8 lb

## 2022-02-16 DIAGNOSIS — N289 Disorder of kidney and ureter, unspecified: Secondary | ICD-10-CM | POA: Diagnosis not present

## 2022-02-16 DIAGNOSIS — Z86010 Personal history of colonic polyps: Secondary | ICD-10-CM | POA: Insufficient documentation

## 2022-02-16 DIAGNOSIS — C8307 Small cell B-cell lymphoma, spleen: Secondary | ICD-10-CM | POA: Diagnosis not present

## 2022-02-16 DIAGNOSIS — Z87891 Personal history of nicotine dependence: Secondary | ICD-10-CM | POA: Diagnosis not present

## 2022-02-16 DIAGNOSIS — F419 Anxiety disorder, unspecified: Secondary | ICD-10-CM | POA: Insufficient documentation

## 2022-02-16 DIAGNOSIS — Z79899 Other long term (current) drug therapy: Secondary | ICD-10-CM | POA: Insufficient documentation

## 2022-02-16 DIAGNOSIS — D696 Thrombocytopenia, unspecified: Secondary | ICD-10-CM | POA: Diagnosis not present

## 2022-02-16 LAB — CBC WITH DIFFERENTIAL (CANCER CENTER ONLY)
Abs Immature Granulocytes: 0.02 10*3/uL (ref 0.00–0.07)
Basophils Absolute: 0.1 10*3/uL (ref 0.0–0.1)
Basophils Relative: 1 %
Eosinophils Absolute: 0.2 10*3/uL (ref 0.0–0.5)
Eosinophils Relative: 3 %
HCT: 41.9 % (ref 36.0–46.0)
Hemoglobin: 14.2 g/dL (ref 12.0–15.0)
Immature Granulocytes: 0 %
Lymphocytes Relative: 20 %
Lymphs Abs: 1.4 10*3/uL (ref 0.7–4.0)
MCH: 29.2 pg (ref 26.0–34.0)
MCHC: 33.9 g/dL (ref 30.0–36.0)
MCV: 86.2 fL (ref 80.0–100.0)
Monocytes Absolute: 0.6 10*3/uL (ref 0.1–1.0)
Monocytes Relative: 9 %
Neutro Abs: 4.8 10*3/uL (ref 1.7–7.7)
Neutrophils Relative %: 67 %
Platelet Count: 178 10*3/uL (ref 150–400)
RBC: 4.86 MIL/uL (ref 3.87–5.11)
RDW: 13.2 % (ref 11.5–15.5)
WBC Count: 7 10*3/uL (ref 4.0–10.5)
nRBC: 0 % (ref 0.0–0.2)

## 2022-02-16 LAB — CMP (CANCER CENTER ONLY)
ALT: 29 U/L (ref 0–44)
AST: 17 U/L (ref 15–41)
Albumin: 3.9 g/dL (ref 3.5–5.0)
Alkaline Phosphatase: 75 U/L (ref 38–126)
Anion gap: 6 (ref 5–15)
BUN: 22 mg/dL (ref 8–23)
CO2: 27 mmol/L (ref 22–32)
Calcium: 9.2 mg/dL (ref 8.9–10.3)
Chloride: 108 mmol/L (ref 98–111)
Creatinine: 0.74 mg/dL (ref 0.44–1.00)
GFR, Estimated: >60 mL/min (ref >60)
Glucose, Bld: 109 mg/dL — ABNORMAL HIGH (ref 70–99)
Potassium: 4.1 mmol/L (ref 3.5–5.1)
Sodium: 141 mmol/L (ref 135–145)
Total Bilirubin: 0.9 mg/dL (ref 0.3–1.2)
Total Protein: 6.4 g/dL — ABNORMAL LOW (ref 6.5–8.1)

## 2022-02-16 LAB — LACTATE DEHYDROGENASE: LDH: 140 U/L (ref 98–192)

## 2022-02-16 NOTE — Progress Notes (Signed)
HEMATOLOGY/ONCOLOGY CLINIC NOTE  Date of Service: 02/16/2022   Patient Care Team: [name], MD as PCP - General (Family Medicine)  PCP: [name] MD   CHIEF COMPLAINTS/PURPOSE OF CONSULTATION: Follow-up for continued evaluation and management of relapsed splenic marginal zone lymphoma and next cycle of maintenance Rituxan.  HISTORY OF PRESENTING ILLNESS:   Please see previous note for details on initial presentation  INTERVAL HISTORY  Wendy Avery is a 67 y.o. female here for continued evaluation and management of her relapsed splenic marginal zone lymphoma and prior to her next cycle of maintenance Rituxan immunotherapy.   Patient was last seen by me on 08/18/21 and reported a then recent positive HPV testing on PAP smear. Otherwise, she was doing well overall.  Today, she reports a previous episode of inflammatory arthritis following a gym workout. She had inflammatory arthritis throughout her entire body, including in her knees, ankles, and other areas. She had received steroids to improve symptoms. No swelling since then. She thinks she may have over exerted herself.  She had her last chemotherapy treatment in May. She reports that she had a colonoscopy in November, 2024, and 2 pre cancerous polyps were found.  She reports that every time she receives COVID-19 vaccination, it takes a while for swelling to resolve (lasted 2 weeks). She also experienced fevers and chills. These symptoms returned 9 days later and lasted 3 days. She took a COVID-19 test which came back negative. No fevers, chills, or night sweats since then. She is weary of continuing to receive the COVID-19 booster vaccination. She has not received the RSV vaccination, and is unsure if she has received the influenza vaccination.  She expressed difficulty with weight loss. She works out twice a week. She plans on increasing her walking. She reports that she had been unable to workout/walk in the summer due  to her swelling.  No abdominal pain. She reports bleeding easily. She recently hurt her right LE and bled easily.   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 #3 Right Shoulder excision of basal cell carcinoma in 2005   SOCIAL HISTORY: Social History   Socioeconomic History   Marital status: Widowed    Spouse name: Not on file   Number of children: Not on file   Years of education: Not on file   Highest education level: Not on file  Occupational History   Not on file  Tobacco Use   Smoking status: Former    Types: Cigarettes    Quit date: 2010    Years since quitting: 14.0   Smokeless tobacco: Never  Vaping Use   Vaping Use: Never used  Substance and Sexual Activity   Alcohol use: Yes    Comment: occassional    Drug use: No   Sexual activity: Not on file  Other Topics Concern   Not on file  Social History Narrative   Not on file   Social Determinants of Health   Financial Resource Strain: Not on file  Food Insecurity: Not on file  Transportation Needs: Not on file  Physical Activity: Not on file  Stress: Not on file  Social Connections: Not on file  Intimate Partner Violence: Not on file  Ex-smoker 1 pack per day quit 30 years ago Uses alcohol socially She is widowed and has 2 daughters  FAMILY HISTORY: Mom had breast cancer at age 77 years and died from metastatic disease Father skin cancer, vocal cord cancer, alcohol abuse  ALLERGIES:  ?PCN  MEDICATIONS:  Current Outpatient Medications  Medication Sig Dispense Refill   LORazepam (ATIVAN) 0.5 MG tablet Take 1 tablet (0.5 mg total) by mouth every 8 (eight) hours as needed for anxiety. 30 tablet 0   naproxen (NAPROSYN) 375 MG tablet Take 1 tablet (375 mg total) by mouth 2 (two) times daily. 20 tablet 0   pantoprazole  (PROTONIX) 20 MG tablet Take 1 tablet (20 mg total) by mouth daily. 30 tablet 1   No current facility-administered medications for this visit.    REVIEW OF SYSTEMS:   10 Point review of Systems was done is negative except as noted above.   PHYSICAL EXAMINATION: ECOG FS:1 - Symptomatic but completely ambulatory  Vitals:   02/16/22 0915  BP: (!) 127/48  Pulse: 62  Resp: 20  Temp: (!) 97.3 F (36.3 C)  SpO2: 99%     Wt Readings from Last 3 Encounters:  02/16/22 229 lb 12.8 oz (104.2 kg)  08/18/21 225 lb (102.1 kg)  07/02/21 220 lb (99.8 kg)   Body mass index is 44.88 kg/m.     GENERAL:alert, in no acute distress and comfortable SKIN: no acute rashes, no significant lesions EYES: conjunctiva are pink and non-injected, sclera anicteric OROPHARYNX: MMM, no exudates, no oropharyngeal erythema or ulceration NECK: supple, no JVD LYMPH:  no palpable lymphadenopathy in the cervical, axillary or inguinal regions LUNGS: clear to auscultation b/l with normal respiratory effort HEART: regular rate & rhythm ABDOMEN:  normoactive bowel sounds , non tender, not distended. Extremity: no pedal edema PSYCH: alert & oriented x 3 with fluent speech NEURO: no focal motor/sensory deficits   Exam performed in chair.  LABORATORY DATA:  I have reviewed the data as listed     Latest Ref Rng & Units 02/16/2022    8:30 AM 08/18/2021   10:56 AM 06/15/2021    9:13 AM  CBC  WBC 4.0 - 10.5 K/uL 7.0  13.4  6.7   Hemoglobin 12.0 - 15.0 g/dL 14.2  13.4  14.3   Hematocrit 36.0 - 46.0 % 41.9  39.5  42.2   Platelets 150 - 400 K/uL 178  163  164    CBC    Component Value Date/Time   WBC 7.0 02/16/2022 0830   WBC 5.8 07/05/2020 0900   RBC 4.86 02/16/2022 0830   HGB 14.2 02/16/2022 0830   HGB 15.0 01/03/2017 1138   HCT 41.9 02/16/2022 0830   HCT 44.4 01/03/2017 1138   PLT 178 02/16/2022 0830   PLT 169 01/03/2017 1138   MCV 86.2 02/16/2022 0830   MCV 86.9 01/03/2017 1138   MCH 29.2  02/16/2022 0830   MCHC 33.9 02/16/2022 0830   RDW 13.2 02/16/2022 0830   RDW 12.9 01/03/2017 1138   LYMPHSABS 1.4 02/16/2022 0830   LYMPHSABS 1.8 01/03/2017 1138   MONOABS 0.6 02/16/2022 0830   MONOABS 0.4 01/03/2017 1138   EOSABS 0.2 02/16/2022 0830   EOSABS 0.2 01/03/2017 1138   BASOSABS 0.1 02/16/2022 0830   BASOSABS 0.0 01/03/2017 1138    .    Latest Ref Rng & Units 02/16/2022    8:30 AM 08/18/2021   10:56 AM 06/15/2021    9:13 AM  CMP  Glucose 70 - 99 mg/dL 109  96  164   BUN 8 - 23 mg/dL 22  26  19  Creatinine 0.44 - 1.00 mg/dL 0.74  0.76  0.75   Sodium 135 - 145 mmol/L 141  138  142   Potassium 3.5 - 5.1 mmol/L 4.1  3.8  3.9   Chloride 98 - 111 mmol/L 108  104  108   CO2 22 - 32 mmol/L '27  28  27   '$ Calcium 8.9 - 10.3 mg/dL 9.2  9.2  9.1   Total Protein 6.5 - 8.1 g/dL 6.4  6.0  6.3   Total Bilirubin 0.3 - 1.2 mg/dL 0.9  0.8  0.6   Alkaline Phos 38 - 126 U/L 75  61  83   AST 15 - 41 U/L '17  14  18   '$ ALT 0 - 44 U/L '29  30  27     '$ Lab Results  Component Value Date   LDH 140 02/16/2022    ...   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   PERIPHERAL BLOOD:  - Abnormal lymphocytes present.  - Mild thrombocytopenia.   COMMENT:   The marrow is normocellular but exhibits a few small abnormal lymphoid  aggregates. Flow cytometry reveals a monoclonal B-cell population with a  similar phenotype (no CD5 or CD10 expression) to that of the  patient's  prior lymphoma. The overall findings are consistent with mild  involvement by the patient's prior non-Hodgkin B-cell lymphoma (splenic  marginal zone lymphoma). While there is no absolute lymphocytosis, there  are abnormal lymphocytes which likely represent peripheral blood  involvement.   07/05/2020 Cytogenetics Report    RADIOGRAPHIC STUDIES: ABDOMEN ULTRASOUND COMPLETE   COMPARISON:  CT 08/03/2016   FINDINGS: Gallbladder: No gallstones or wall thickening visualized. No sonographic Murphy sign noted by sonographer.   Common bile duct: Diameter: 1.8 mm.   Liver: No focal lesion identified. Within normal limits in parenchymal echogenicity. Portal vein is patent on color Doppler imaging with normal direction of blood flow towards the liver.   IVC: No abnormality visualized.   Pancreas: Visualized portion unremarkable.   Spleen: Mild to moderate splenomegaly as the spleen measures 12.9 cm in greatest diameter, although splenic volume is increased measuring 785 cubic cm.   Right Kidney: Length: 10.1 cm. Echogenicity within normal limits. No mass or hydronephrosis visualized.   Left Kidney: Length: 10.8 cm. Echogenicity within normal limits. No mass or hydronephrosis visualized.   Abdominal aorta: No aneurysm visualized.   Other findings: None.   IMPRESSION: No acute findings.   Known mild to moderate splenomegaly with splenic volume 785 cubic cm.     Electronically Signed   By: Marin Olp M.D.   On: 11/30/2016 10:09   ASSESSMENT & PLAN:   67 y.o. 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 Diffusely hypermetabolic on PET/CT No evidence of liver disease on CT scan. No evidence of portal hypertension. MPN w/u demonstrated no evidence of genetics mutations to suggest MPN  #2 Mild thrombocytopenia  likely due to the SMZL and from hypersplenism in the setting of massive splenomegaly. Platelets today have normalized to 155k  #3 Indetermine left renal lesion 1.5 cms in size ?complex cyst - will need monitoring. No reports of this on [place] ab in 11/2016.  #4 Severe anxiety- improved since initial treatment.   PLAN:  -educated patient that losing muscle mass with age lowers metabolic rate -recommended consistency and gradual adjustment of physical regimen to help with weight loss.  -discussed lab results on 02/16/22 with patient. Blood counts normal. CBC showed hemoglobin of 14.2 , WBC of 7 K, and platelets of 178 K. CMP normal.  LDH . Lab Results  Component Value Date   LDH 140 02/16/2022  -patient has no lab or clinical evidence of recurrence/Progression of patients SMZL -No major concern for pt's consumption of blue cheese from an oncology perspective -Recommended patient to recieve their RSV, influenza, and COVID-19 booster vaccinations  FOLLOW UP: RTC with Dr Irene Limbo with labs in 6 months  The total time spent in the appointment was 20 minutes* .  All of the patient's questions were answered with apparent satisfaction. The patient knows to call the clinic with any problems, questions or concerns.  Sullivan Lone MD MS AAHIVMS [facility] Hematology/Oncology Physician [facility]  .*Total Encounter Time as defined by the Centers for Medicare and Medicaid Services includes, in addition to the face-to-face time of a patient visit (documented in the note above) non-face-to-face time: obtaining and reviewing outside history, ordering and reviewing medications, tests or procedures, care coordination (communications with other health care professionals or caregivers) and documentation in the medical record.   I,Mitra Faeizi,acting as a Education administrator for Sullivan Lone, MD.,have documented all relevant documentation on the behalf of Sullivan Lone, MD,as directed by  Sullivan Lone, MD while in the  presence of Sullivan Lone, MD.   .I have reviewed the above documentation for accuracy and completeness, and I agree with the above. Brunetta Genera MD

## 2022-07-23 ENCOUNTER — Telehealth: Payer: Self-pay | Admitting: Hematology

## 2022-07-23 NOTE — Telephone Encounter (Signed)
Patient is aware of upcoming rescheduled appointment times/dates

## 2022-08-21 ENCOUNTER — Other Ambulatory Visit: Payer: BC Managed Care – PPO

## 2022-08-21 ENCOUNTER — Ambulatory Visit: Payer: BC Managed Care – PPO | Admitting: Hematology

## 2022-09-12 ENCOUNTER — Other Ambulatory Visit: Payer: Self-pay

## 2022-09-12 DIAGNOSIS — C8307 Small cell B-cell lymphoma, spleen: Secondary | ICD-10-CM

## 2022-09-13 ENCOUNTER — Inpatient Hospital Stay (HOSPITAL_BASED_OUTPATIENT_CLINIC_OR_DEPARTMENT_OTHER): Payer: Medicare Other | Admitting: Hematology

## 2022-09-13 ENCOUNTER — Inpatient Hospital Stay: Payer: Medicare Other | Attending: Hematology

## 2022-09-13 VITALS — BP 136/59 | HR 58 | Temp 97.6°F | Resp 18 | Wt 234.2 lb

## 2022-09-13 DIAGNOSIS — Z808 Family history of malignant neoplasm of other organs or systems: Secondary | ICD-10-CM | POA: Diagnosis not present

## 2022-09-13 DIAGNOSIS — F419 Anxiety disorder, unspecified: Secondary | ICD-10-CM | POA: Insufficient documentation

## 2022-09-13 DIAGNOSIS — N289 Disorder of kidney and ureter, unspecified: Secondary | ICD-10-CM | POA: Diagnosis not present

## 2022-09-13 DIAGNOSIS — Z87891 Personal history of nicotine dependence: Secondary | ICD-10-CM | POA: Insufficient documentation

## 2022-09-13 DIAGNOSIS — D696 Thrombocytopenia, unspecified: Secondary | ICD-10-CM | POA: Diagnosis not present

## 2022-09-13 DIAGNOSIS — Z803 Family history of malignant neoplasm of breast: Secondary | ICD-10-CM | POA: Diagnosis not present

## 2022-09-13 DIAGNOSIS — C8307 Small cell B-cell lymphoma, spleen: Secondary | ICD-10-CM | POA: Insufficient documentation

## 2022-09-13 LAB — CMP (CANCER CENTER ONLY)
ALT: 35 U/L (ref 0–44)
AST: 25 U/L (ref 15–41)
Albumin: 3.9 g/dL (ref 3.5–5.0)
Alkaline Phosphatase: 78 U/L (ref 38–126)
Anion gap: 8 (ref 5–15)
BUN: 14 mg/dL (ref 8–23)
CO2: 24 mmol/L (ref 22–32)
Calcium: 9.1 mg/dL (ref 8.9–10.3)
Chloride: 106 mmol/L (ref 98–111)
Creatinine: 0.71 mg/dL (ref 0.44–1.00)
GFR, Estimated: >60 mL/min (ref >60)
Glucose, Bld: 102 mg/dL — ABNORMAL HIGH (ref 70–99)
Potassium: 4.1 mmol/L (ref 3.5–5.1)
Sodium: 138 mmol/L (ref 135–145)
Total Bilirubin: 0.9 mg/dL (ref 0.3–1.2)
Total Protein: 6.3 g/dL — ABNORMAL LOW (ref 6.5–8.1)

## 2022-09-13 LAB — CBC WITH DIFFERENTIAL (CANCER CENTER ONLY)
Abs Immature Granulocytes: 0.02 10*3/uL (ref 0.00–0.07)
Basophils Absolute: 0 10*3/uL (ref 0.0–0.1)
Basophils Relative: 1 %
Eosinophils Absolute: 0.2 10*3/uL (ref 0.0–0.5)
Eosinophils Relative: 4 %
HCT: 43.1 % (ref 36.0–46.0)
Hemoglobin: 14.2 g/dL (ref 12.0–15.0)
Immature Granulocytes: 0 %
Lymphocytes Relative: 21 %
Lymphs Abs: 1.3 10*3/uL (ref 0.7–4.0)
MCH: 29.3 pg (ref 26.0–34.0)
MCHC: 32.9 g/dL (ref 30.0–36.0)
MCV: 88.9 fL (ref 80.0–100.0)
Monocytes Absolute: 0.5 10*3/uL (ref 0.1–1.0)
Monocytes Relative: 8 %
Neutro Abs: 4 10*3/uL (ref 1.7–7.7)
Neutrophils Relative %: 66 %
Platelet Count: 167 10*3/uL (ref 150–400)
RBC: 4.85 MIL/uL (ref 3.87–5.11)
RDW: 13.1 % (ref 11.5–15.5)
WBC Count: 6.1 10*3/uL (ref 4.0–10.5)
nRBC: 0 % (ref 0.0–0.2)

## 2022-09-13 LAB — LACTATE DEHYDROGENASE: LDH: 147 U/L (ref 98–192)

## 2022-09-13 NOTE — Progress Notes (Signed)
HEMATOLOGY/ONCOLOGY CLINIC NOTE  Date of Service: 09/13/2022   Patient Care Team: [name], MD as PCP - General (Family Medicine)  PCP: [name] MD   CHIEF COMPLAINTS/PURPOSE OF CONSULTATION: Follow-up for continued evaluation and management of relapsed splenic marginal zone lymphoma and next cycle of maintenance Rituxan.  HISTORY OF PRESENTING ILLNESS:   Please see previous note for details on initial presentation  INTERVAL HISTORY  Wendy Avery is a 67 y.o. female here for continued evaluation and management of her relapsed splenic marginal zone lymphoma.  Patient was last seen by me on 02/16/2022 and reported an episode of generalized inflammatory arthritis. She also reported two precancerous polyps were found on a recent colonoscopy. Patient complained of swelling lasting two weeks, fever, and chills following a COVID-19 vaccination. She noted easily bleeding from her right LE after an injury.   Today, she reports that she has been on and off of steroids for generalized inflammatory arthritis. She was started on Steroids in June 2023 and discontinued in December 2023. She had a flare up in January after engaging in light exercise, though she notes it was not as significant as previously. She was restarted on steroids by her rheumatologist, Dr. Kathi Ludwig, in March, but discontinued again in July. Patient report that recently, she notices symptoms in the muscles of her lower extremities with stiffness, but denies any specific redness or swollen joints.   She complains of previous stress related to work and notes that during that time, she did endorse generalized inflammation.  She goes to gym twice a week and engages in light exercise. She notes that she has gained 10 pounds in the last year.   Patient reports easily bruising in her arms which are healing at this time.   She complains of watery eyes and scabby nose beginning last year. At that time, she reports that she  experienced vertigo after receiving the COVID-19 vaccine. Her symptoms resolved with steroids. She denies any seasonal allergies.   She denies any abdominal pain, fever, chills, or night sweats. Patient does have some leg swelling. She reports that on one occasion after pushing a sled, she experienced pain in her toes the next day.  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 #3 Right Shoulder excision of basal cell carcinoma in 2005   SOCIAL HISTORY: Social History   Socioeconomic History   Marital status: Widowed    Spouse name: Not on file   Number of children: Not on file   Years of education: Not on file   Highest education level: Not on file  Occupational History   Not on file  Tobacco Use   Smoking status: Former    Current packs/day: 0.00    Types: Cigarettes    Quit date: 2010    Years since quitting: 14.6   Smokeless tobacco: Never  Vaping Use   Vaping status: Never Used  Substance and Sexual Activity   Alcohol use: Yes    Comment: occassional    Drug use: No   Sexual activity: Not on file  Other Topics Concern   Not on file  Social History Narrative   Not on file   Social Determinants of Health   Financial Resource Strain: Not on file  Food Insecurity: Not on file  Transportation Needs: Not on file  Physical  Activity: Not on file  Stress: Not on file  Social Connections: Not on file  Intimate Partner Violence: Not on file  Ex-smoker 1 pack per day quit 30 years ago Uses alcohol socially She is widowed and has 2 daughters   FAMILY HISTORY: Mom had breast cancer at age 65 years and died from metastatic disease Father skin cancer, vocal cord cancer, alcohol abuse  ALLERGIES:  ?PCN  MEDICATIONS:  Current Outpatient Medications  Medication Sig Dispense Refill    LORazepam (ATIVAN) 0.5 MG tablet Take 1 tablet (0.5 mg total) by mouth every 8 (eight) hours as needed for anxiety. 30 tablet 0   naproxen (NAPROSYN) 375 MG tablet Take 1 tablet (375 mg total) by mouth 2 (two) times daily. 20 tablet 0   pantoprazole (PROTONIX) 20 MG tablet Take 1 tablet (20 mg total) by mouth daily. 30 tablet 1   No current facility-administered medications for this visit.    REVIEW OF SYSTEMS:    10 Point review of Systems was done is negative except as noted above.   PHYSICAL EXAMINATION: ECOG FS:1 - Symptomatic but completely ambulatory  Vitals:   09/13/22 0915  BP: (!) 136/59  Pulse: (!) 58  Resp: 18  Temp: 97.6 F (36.4 C)  SpO2: 99%      Wt Readings from Last 3 Encounters:  09/13/22 234 lb 3.2 oz (106.2 kg)  02/16/22 229 lb 12.8 oz (104.2 kg)  08/18/21 225 lb (102.1 kg)   Body mass index is 45.74 kg/m.    GENERAL:alert, in no acute distress and comfortable SKIN: no acute rashes, no significant lesions EYES: conjunctiva are pink and non-injected, sclera anicteric OROPHARYNX: MMM, no exudates, no oropharyngeal erythema or ulceration NECK: supple, no JVD LYMPH:  no palpable lymphadenopathy in the cervical, axillary or inguinal regions LUNGS: clear to auscultation b/l with normal respiratory effort HEART: regular rate & rhythm ABDOMEN:  normoactive bowel sounds , non tender, not distended. Extremity: no pedal edema PSYCH: alert & oriented x 3 with fluent speech NEURO: no focal motor/sensory deficits   LABORATORY DATA:  I have reviewed the data as listed     Latest Ref Rng & Units 09/13/2022    8:49 AM 02/16/2022    8:30 AM 08/18/2021   10:56 AM  CBC  WBC 4.0 - 10.5 K/uL 6.1  7.0  13.4   Hemoglobin 12.0 - 15.0 g/dL 81.1  91.4  78.2   Hematocrit 36.0 - 46.0 % 43.1  41.9  39.5   Platelets 150 - 400 K/uL 167  178  163    CBC    Component Value Date/Time   WBC 6.1 09/13/2022 0849   WBC 5.8 07/05/2020 0900   RBC 4.85 09/13/2022 0849   HGB  14.2 09/13/2022 0849   HGB 15.0 01/03/2017 1138   HCT 43.1 09/13/2022 0849   HCT 44.4 01/03/2017 1138   PLT 167 09/13/2022 0849   PLT 169 01/03/2017 1138   MCV 88.9 09/13/2022 0849   MCV 86.9 01/03/2017 1138   MCH 29.3 09/13/2022 0849   MCHC 32.9 09/13/2022 0849   RDW 13.1 09/13/2022 0849   RDW 12.9 01/03/2017 1138   LYMPHSABS 1.3 09/13/2022 0849   LYMPHSABS 1.8 01/03/2017 1138   MONOABS 0.5 09/13/2022 0849   MONOABS 0.4 01/03/2017 1138   EOSABS 0.2 09/13/2022 0849   EOSABS 0.2 01/03/2017 1138   BASOSABS 0.0 09/13/2022 0849   BASOSABS 0.0 01/03/2017 1138    .    Latest Ref Rng & Units 09/13/2022  8:49 AM 02/16/2022    8:30 AM 08/18/2021   10:56 AM  CMP  Glucose 70 - 99 mg/dL 540  981  96   BUN 8 - 23 mg/dL 14  22  26    Creatinine 0.44 - 1.00 mg/dL 1.91  4.78  2.95   Sodium 135 - 145 mmol/L 138  141  138   Potassium 3.5 - 5.1 mmol/L 4.1  4.1  3.8   Chloride 98 - 111 mmol/L 106  108  104   CO2 22 - 32 mmol/L 24  27  28    Calcium 8.9 - 10.3 mg/dL 9.1  9.2  9.2   Total Protein 6.5 - 8.1 g/dL 6.3  6.4  6.0   Total Bilirubin 0.3 - 1.2 mg/dL 0.9  0.9  0.8   Alkaline Phos 38 - 126 U/L 78  75  61   AST 15 - 41 U/L 25  17  14    ALT 0 - 44 U/L 35  29  30     Lab Results  Component Value Date   LDH 147 09/13/2022    ...   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   PERIPHERAL BLOOD:  - Abnormal lymphocytes present.  - Mild thrombocytopenia.    COMMENT:   The marrow is normocellular but exhibits a few small abnormal lymphoid  aggregates. Flow cytometry reveals a monoclonal B-cell population with a  similar phenotype (no CD5 or CD10 expression) to that of the patient's  prior lymphoma. The overall findings are consistent with mild  involvement by the patient's prior non-Hodgkin B-cell lymphoma (splenic  marginal zone lymphoma). While there is no absolute lymphocytosis, there  are abnormal lymphocytes which likely represent peripheral blood  involvement.   07/05/2020 Cytogenetics Report    RADIOGRAPHIC STUDIES: ABDOMEN ULTRASOUND COMPLETE   COMPARISON:  CT 08/03/2016   FINDINGS: Gallbladder: No gallstones or wall thickening visualized. No sonographic Murphy sign noted by sonographer.   Common bile duct: Diameter: 1.8 mm.   Liver: No focal lesion identified. Within normal limits in parenchymal echogenicity. Portal vein is patent on color Doppler imaging with normal direction of blood flow towards the liver.   IVC: No abnormality visualized.   Pancreas: Visualized portion unremarkable.   Spleen: Mild to moderate splenomegaly as the spleen measures 12.9 cm in greatest diameter, although splenic volume is increased measuring 785 cubic cm.   Right Kidney: Length: 10.1 cm. Echogenicity within normal limits. No mass or hydronephrosis visualized.   Left Kidney: Length: 10.8 cm. Echogenicity within normal limits. No mass or hydronephrosis visualized.   Abdominal aorta: No aneurysm visualized.   Other findings: None.   IMPRESSION: No acute findings.  Known mild to moderate splenomegaly with splenic volume 785 cubic cm.     Electronically Signed   By: Elberta Fortis M.D.   On: 11/30/2016 10:09   ASSESSMENT & PLAN:   67 y.o. 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 Diffusely hypermetabolic on PET/CT No evidence of liver disease on CT scan. No evidence of portal hypertension. 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 Platelets today have normalized to 155k  #3 Indetermine left renal lesion 1.5 cms in size ?complex cyst - will need monitoring. No reports of this on [place] ab in 11/2016.  #4 Severe anxiety- improved since initial treatment.   PLAN:   -Discussed lab results on 09/13/2022 in detail with patient. CBC normal, showed WBC of 6.1K, hemoglobin of 14.2, and platelets of 167K. -CMP normal -patient has no lab or clinical evidence of recurrence/Progression of SMZL -leg swelling is likely related to steroid use -advised patient to limit salt-intake, keep legs elevated, and use compression socks. If her swelling is bothersome despite doing these things, discussed option of connecting with rheumatolgist to consider starting diuretics or managing steroid use -discussed options to help manage stress levels -advised patient to warm up properly and cool off after activity to limit cramping from lactic acid -recommend low impact activity to not stress the joints -educated patient that Rituxan may have previously played a role in treating inflammatory arthritis as well -answered all of patient's questions in detail regarding lymphoma -encouraged patient to connect with rheumatologist to discuss any questions related to inflammatory arthritis or to potentially consider other treatment options  FOLLOW UP: RTC with Dr Candise Che with labs in 6 months  The total time spent in the appointment was 25 minutes* .  All of the patient's questions were answered with apparent satisfaction. The patient knows to call the clinic with any problems, questions or concerns.   Wyvonnia Lora MD MS AAHIVMS [facility] Hematology/Oncology  Physician [facility]  .*Total Encounter Time as defined by the Centers for Medicare and Medicaid Services includes, in addition to the face-to-face time of a patient visit (documented in the note above) non-face-to-face time: obtaining and reviewing outside history, ordering and reviewing medications, tests or procedures, care coordination (communications with other health care professionals or caregivers) and documentation in the medical record.    I,Mitra Faeizi,acting as a Neurosurgeon for Wyvonnia Lora, MD.,have documented all relevant documentation on the behalf of Wyvonnia Lora, MD,as directed by  Wyvonnia Lora, MD while in the presence of Wyvonnia Lora, MD.  .I have reviewed the above documentation for accuracy and completeness, and I agree with the above. Johney Maine MD

## 2022-12-17 ENCOUNTER — Other Ambulatory Visit (HOSPITAL_COMMUNITY): Payer: Self-pay | Admitting: Family Medicine

## 2022-12-17 DIAGNOSIS — M7989 Other specified soft tissue disorders: Secondary | ICD-10-CM

## 2022-12-17 DIAGNOSIS — M79606 Pain in leg, unspecified: Secondary | ICD-10-CM

## 2022-12-25 ENCOUNTER — Ambulatory Visit (HOSPITAL_COMMUNITY)
Admission: RE | Admit: 2022-12-25 | Discharge: 2022-12-25 | Disposition: A | Discharge status: Home / Self Care | Payer: Medicare Other | Source: Ambulatory Visit | Attending: Family Medicine | Admitting: Family Medicine

## 2022-12-25 ENCOUNTER — Ambulatory Visit (HOSPITAL_BASED_OUTPATIENT_CLINIC_OR_DEPARTMENT_OTHER)
Admission: RE | Admit: 2022-12-25 | Discharge: 2022-12-25 | Disposition: A | Discharge status: Home / Self Care | Payer: Medicare Other | Source: Ambulatory Visit | Attending: Family Medicine | Admitting: Family Medicine

## 2022-12-25 DIAGNOSIS — M7989 Other specified soft tissue disorders: Secondary | ICD-10-CM | POA: Diagnosis present

## 2022-12-25 DIAGNOSIS — M79606 Pain in leg, unspecified: Secondary | ICD-10-CM | POA: Diagnosis not present

## 2022-12-25 LAB — ECHOCARDIOGRAM COMPLETE
Area-P 1/2: 4.1 cm2
S' Lateral: 2.9 cm

## 2023-01-30 IMAGING — CT CT BIOPSY AND ASPIRATION BONE MARROW
1 series · 1 of 1 positions shown · non-contrast
Comparison: none

INDICATION: 65-year-old female with a history of splenic marginal zone B-cell
lymphoma and concern for recurrent disease.

[Series 1: topogram 1.0 tr20 · coronal · 1.00mm/px · 1 of 1 slices shown]
[im 1/1]
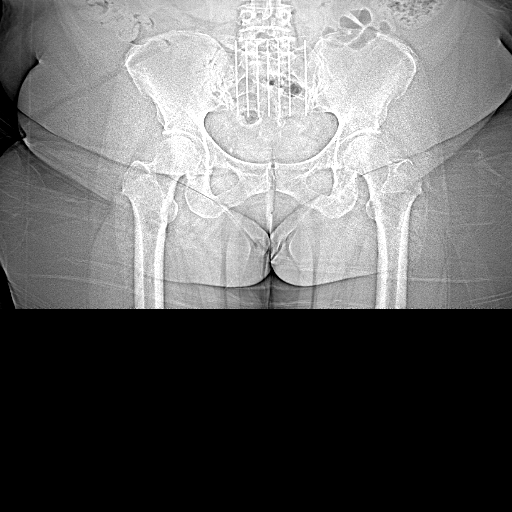

[1 of 1 positions shown; findings below may reference images not displayed]

EXAM:
CT GUIDED BONE MARROW ASPIRATION AND CORE BIOPSY

MEDICATIONS:
None.

ANESTHESIA/SEDATION:
100 mcg fentanyl administered intravenously. This does not
constitute moderate sedation.

FLUOROSCOPY TIME:  None

COMPLICATIONS:
None immediate.

Estimated blood loss: <25 mL

PROCEDURE:
Informed written consent was obtained from the patient after a
thorough discussion of the procedural risks, benefits and
alternatives. All questions were addressed. Maximal Sterile Barrier
Technique was utilized including caps, mask, sterile gowns, sterile
gloves, sterile drape, hand hygiene and skin antiseptic. A timeout
was performed prior to the initiation of the procedure.

The patient was positioned prone and non-contrast localization CT
was performed of the pelvis to demonstrate the iliac marrow spaces.

Maximal barrier sterile technique utilized including caps, mask,
sterile gowns, sterile gloves, large sterile drape, hand hygiene,
and betadine prep.

Under sterile conditions and local anesthesia, an 11 gauge coaxial
bone biopsy needle was advanced into the right iliac marrow space.
Needle position was confirmed with CT imaging. Initially, bone
marrow aspiration was performed. Next, the 11 gauge outer cannula
was utilized to obtain a right iliac bone marrow core biopsy. Needle
was removed. Hemostasis was obtained with compression. The patient
tolerated the procedure well. Samples were prepared with the
cytotechnologist.
IMPRESSION: Technically successful right iliac bone marrow aspiration and core
biopsy.

## 2023-03-11 ENCOUNTER — Other Ambulatory Visit: Payer: Self-pay

## 2023-03-11 DIAGNOSIS — C8307 Small cell B-cell lymphoma, spleen: Secondary | ICD-10-CM

## 2023-03-12 ENCOUNTER — Inpatient Hospital Stay (HOSPITAL_BASED_OUTPATIENT_CLINIC_OR_DEPARTMENT_OTHER): Payer: Medicare Other | Admitting: Hematology

## 2023-03-12 ENCOUNTER — Inpatient Hospital Stay: Payer: Medicare Other | Attending: Hematology

## 2023-03-12 VITALS — BP 142/59 | HR 63 | Temp 97.3°F | Resp 18 | Wt 231.7 lb

## 2023-03-12 DIAGNOSIS — M1990 Unspecified osteoarthritis, unspecified site: Secondary | ICD-10-CM | POA: Diagnosis not present

## 2023-03-12 DIAGNOSIS — C8307 Small cell B-cell lymphoma, spleen: Secondary | ICD-10-CM

## 2023-03-12 DIAGNOSIS — N289 Disorder of kidney and ureter, unspecified: Secondary | ICD-10-CM | POA: Insufficient documentation

## 2023-03-12 DIAGNOSIS — F419 Anxiety disorder, unspecified: Secondary | ICD-10-CM | POA: Insufficient documentation

## 2023-03-12 DIAGNOSIS — D696 Thrombocytopenia, unspecified: Secondary | ICD-10-CM | POA: Insufficient documentation

## 2023-03-12 DIAGNOSIS — Z87891 Personal history of nicotine dependence: Secondary | ICD-10-CM | POA: Insufficient documentation

## 2023-03-12 LAB — CMP (CANCER CENTER ONLY)
ALT: 23 U/L (ref 0–44)
AST: 17 U/L (ref 15–41)
Albumin: 4 g/dL (ref 3.5–5.0)
Alkaline Phosphatase: 85 U/L (ref 38–126)
Anion gap: 5 (ref 5–15)
BUN: 19 mg/dL (ref 8–23)
CO2: 28 mmol/L (ref 22–32)
Calcium: 8.9 mg/dL (ref 8.9–10.3)
Chloride: 107 mmol/L (ref 98–111)
Creatinine: 0.71 mg/dL (ref 0.44–1.00)
GFR, Estimated: >60 mL/min (ref >60)
Glucose, Bld: 104 mg/dL — ABNORMAL HIGH (ref 70–99)
Potassium: 4 mmol/L (ref 3.5–5.1)
Sodium: 140 mmol/L (ref 135–145)
Total Bilirubin: 0.8 mg/dL (ref 0.0–1.2)
Total Protein: 6 g/dL — ABNORMAL LOW (ref 6.5–8.1)

## 2023-03-12 LAB — CBC WITH DIFFERENTIAL (CANCER CENTER ONLY)
Abs Immature Granulocytes: 0.02 10*3/uL (ref 0.00–0.07)
Basophils Absolute: 0 10*3/uL (ref 0.0–0.1)
Basophils Relative: 1 %
Eosinophils Absolute: 0.2 10*3/uL (ref 0.0–0.5)
Eosinophils Relative: 3 %
HCT: 41.8 % (ref 36.0–46.0)
Hemoglobin: 13.5 g/dL (ref 12.0–15.0)
Immature Granulocytes: 0 %
Lymphocytes Relative: 21 %
Lymphs Abs: 1.2 10*3/uL (ref 0.7–4.0)
MCH: 27.7 pg (ref 26.0–34.0)
MCHC: 32.3 g/dL (ref 30.0–36.0)
MCV: 85.7 fL (ref 80.0–100.0)
Monocytes Absolute: 0.5 10*3/uL (ref 0.1–1.0)
Monocytes Relative: 8 %
Neutro Abs: 4.1 10*3/uL (ref 1.7–7.7)
Neutrophils Relative %: 67 %
Platelet Count: 158 10*3/uL (ref 150–400)
RBC: 4.88 MIL/uL (ref 3.87–5.11)
RDW: 13.1 % (ref 11.5–15.5)
WBC Count: 6 10*3/uL (ref 4.0–10.5)
nRBC: 0 % (ref 0.0–0.2)

## 2023-03-12 LAB — LACTATE DEHYDROGENASE: LDH: 133 U/L (ref 98–192)

## 2023-03-12 NOTE — Progress Notes (Signed)
 HEMATOLOGY/ONCOLOGY CLINIC NOTE  Date of Service: 03/12/2023   Patient Care Team: [name], MD as PCP - General (Family Medicine)  PCP: [name] MD   CHIEF COMPLAINTS/PURPOSE OF CONSULTATION: Follow-up for continued evaluation and management of relapsed splenic marginal zone lymphoma and next cycle of maintenance Rituxan.  HISTORY OF PRESENTING ILLNESS:   Please see previous note for details on initial presentation  INTERVAL HISTORY  Wendy Avery is a 68 y.o. female here for continued evaluation and management of her relapsed splenic marginal zone lymphoma.  Patient was last seen by me on 09/13/2022 and she complained of lower extremities muscle stiffness due to arthritis, watery eyes, mild bilateral leg swelling.   Patient notes she has been doing well overall since our last visit. She complains of occasional bilateral leg swelling and mild congestion. She notes that she had severe right leg pain in September. She went to her Rheumatologist, who recommended echocardiogram and an ultrasound of the right leg which did not show any abnormalities. Patient notes that the pain lasted one day and it improved over-night. She denies having severe leg pain since September. Patient notes that her health has significantly improved since September.   She denies any new infection issues, fever, chills, night sweats, unexpected weight loss, back pain, chest pain, new lumps/bumps, abdominal pain. She does complain of bilateral hip pain due to arthritis.   Patient notes she has been eating well and has been staying well-hydrated.   Patient has been taking her B-Complex and multi-vitamin, but not regularly.   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 #3 Right Shoulder excision of basal cell  carcinoma in 2005   SOCIAL HISTORY: Social History   Socioeconomic History   Marital status: Widowed    Spouse name: Not on file   Number of children: Not on file   Years of education: Not on file   Highest education level: Not on file  Occupational History   Not on file  Tobacco Use   Smoking status: Former    Current packs/day: 0.00    Types: Cigarettes    Quit date: 2010    Years since quitting: 15.1   Smokeless tobacco: Never  Vaping Use   Vaping status: Never Used  Substance and Sexual Activity   Alcohol use: Yes    Comment: occassional    Drug use: No   Sexual activity: Not on file  Other Topics Concern   Not on file  Social History Narrative   Not on file   Social Drivers of Health   Financial Resource Strain: Not on file  Food Insecurity: Not on file  Transportation Needs: Not on file  Physical Activity: Not on file  Stress: Not on file  Social Connections: Not on file  Intimate Partner Violence: Not on file  Ex-smoker 1 pack per day quit 30 years ago Uses alcohol socially She is widowed and has 2 daughters   FAMILY HISTORY: Mom had breast cancer at age 63 years and died from metastatic disease Father skin cancer, vocal cord cancer, alcohol abuse  ALLERGIES:  ?PCN  MEDICATIONS:  Current Outpatient Medications  Medication Sig Dispense Refill   LORazepam (ATIVAN) 0.5 MG tablet Take 1 tablet (0.5 mg total) by mouth every 8 (eight) hours as needed for anxiety. 30 tablet 0  naproxen (NAPROSYN) 375 MG tablet Take 1 tablet (375 mg total) by mouth 2 (two) times daily. 20 tablet 0   pantoprazole (PROTONIX) 20 MG tablet Take 1 tablet (20 mg total) by mouth daily. 30 tablet 1   No current facility-administered medications for this visit.    REVIEW OF SYSTEMS:    10 Point review of Systems was done is negative except as noted above.   PHYSICAL EXAMINATION: ECOG FS:1 - Symptomatic but completely ambulatory  Vitals:   03/12/23 0914  BP: (!) 142/59   Pulse: 63  Resp: 18  Temp: (!) 97.3 F (36.3 C)  SpO2: 99%    Wt Readings from Last 3 Encounters:  09/13/22 234 lb 3.2 oz (106.2 kg)  02/16/22 229 lb 12.8 oz (104.2 kg)  08/18/21 225 lb (102.1 kg)   Body mass index is 45.25 kg/m.    GENERAL:alert, in no acute distress and comfortable SKIN: no acute rashes, no significant lesions EYES: conjunctiva are pink and non-injected, sclera anicteric OROPHARYNX: MMM, no exudates, no oropharyngeal erythema or ulceration NECK: supple, no JVD LYMPH:  no palpable lymphadenopathy in the cervical, axillary or inguinal regions LUNGS: clear to auscultation b/l with normal respiratory effort HEART: regular rate & rhythm ABDOMEN:  normoactive bowel sounds , non tender, not distended. Extremity: no pedal edema PSYCH: alert & oriented x 3 with fluent speech NEURO: no focal motor/sensory deficits   LABORATORY DATA:  I have reviewed the data as listed     Latest Ref Rng & Units 03/12/2023    8:51 AM 09/13/2022    8:49 AM 02/16/2022    8:30 AM  CBC  WBC 4.0 - 10.5 K/uL 6.0  6.1  7.0   Hemoglobin 12.0 - 15.0 g/dL 16.1  09.6  04.5   Hematocrit 36.0 - 46.0 % 41.8  43.1  41.9   Platelets 150 - 400 K/uL 158  167  178    CBC    Component Value Date/Time   WBC 6.0 03/12/2023 0851   WBC 5.8 07/05/2020 0900   RBC 4.88 03/12/2023 0851   HGB 13.5 03/12/2023 0851   HGB 15.0 01/03/2017 1138   HCT 41.8 03/12/2023 0851   HCT 44.4 01/03/2017 1138   PLT 158 03/12/2023 0851   PLT 169 01/03/2017 1138   MCV 85.7 03/12/2023 0851   MCV 86.9 01/03/2017 1138   MCH 27.7 03/12/2023 0851   MCHC 32.3 03/12/2023 0851   RDW 13.1 03/12/2023 0851   RDW 12.9 01/03/2017 1138   LYMPHSABS 1.2 03/12/2023 0851   LYMPHSABS 1.8 01/03/2017 1138   MONOABS 0.5 03/12/2023 0851   MONOABS 0.4 01/03/2017 1138   EOSABS 0.2 03/12/2023 0851   EOSABS 0.2 01/03/2017 1138   BASOSABS 0.0 03/12/2023 0851   BASOSABS 0.0 01/03/2017 1138    .    Latest Ref Rng & Units 03/12/2023     8:51 AM 09/13/2022    8:49 AM 02/16/2022    8:30 AM  CMP  Glucose 70 - 99 mg/dL 409  811  914   BUN 8 - 23 mg/dL 19  14  22    Creatinine 0.44 - 1.00 mg/dL 7.82  9.56  2.13   Sodium 135 - 145 mmol/L 140  138  141   Potassium 3.5 - 5.1 mmol/L 4.0  4.1  4.1   Chloride 98 - 111 mmol/L 107  106  108   CO2 22 - 32 mmol/L 28  24  27    Calcium 8.9 - 10.3 mg/dL 8.9  9.1  9.2   Total Protein 6.5 - 8.1 g/dL 6.0  6.3  6.4   Total Bilirubin 0.0 - 1.2 mg/dL 0.8  0.9  0.9   Alkaline Phos 38 - 126 U/L 85  78  75   AST 15 - 41 U/L 17  25  17    ALT 0 - 44 U/L 23  35  29     Lab Results  Component Value Date   LDH 133 03/12/2023    ...   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   PERIPHERAL BLOOD:  - Abnormal lymphocytes present.  - Mild thrombocytopenia.   COMMENT:   The marrow is normocellular but exhibits a few small abnormal lymphoid  aggregates. Flow cytometry reveals a monoclonal B-cell population with a  similar phenotype (no CD5 or CD10 expression) to that of the patient's  prior lymphoma. The overall findings are consistent with mild  involvement by the patient's prior non-Hodgkin B-cell lymphoma (splenic  marginal zone lymphoma). While there is no absolute lymphocytosis, there  are abnormal lymphocytes which likely represent peripheral blood  involvement.   07/05/2020 Cytogenetics Report    RADIOGRAPHIC STUDIES: ABDOMEN ULTRASOUND COMPLETE    COMPARISON:  CT 08/03/2016   FINDINGS: Gallbladder: No gallstones or wall thickening visualized. No sonographic Murphy sign noted by sonographer.   Common bile duct: Diameter: 1.8 mm.   Liver: No focal lesion identified. Within normal limits in parenchymal echogenicity. Portal vein is patent on color Doppler imaging with normal direction of blood flow towards the liver.   IVC: No abnormality visualized.   Pancreas: Visualized portion unremarkable.   Spleen: Mild to moderate splenomegaly as the spleen measures 12.9 cm in greatest diameter, although splenic volume is increased measuring 785 cubic cm.   Right Kidney: Length: 10.1 cm. Echogenicity within normal limits. No mass or hydronephrosis visualized.   Left Kidney: Length: 10.8 cm. Echogenicity within normal limits. No mass or hydronephrosis visualized.   Abdominal aorta: No aneurysm visualized.   Other findings: None.   IMPRESSION: No acute findings.   Known mild to moderate splenomegaly with splenic volume 785 cubic cm.     Electronically Signed   By: Elberta Fortis M.D.   On: 11/30/2016 10:09   ASSESSMENT & PLAN:   68 y.o. 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 Diffusely hypermetabolic on PET/CT No evidence of liver disease on CT scan. No evidence of portal hypertension. MPN w/u demonstrated no evidence of genetics mutations to suggest MPN  #2 Mild thrombocytopenia likely due to the  SMZL and from hypersplenism in the setting of massive splenomegaly. Platelets today have normalized to 155k  #3 Indetermine left renal lesion 1.5 cms in size ?complex cyst - will need monitoring. No reports of this on [place] ab in 11/2016.  #4 Severe anxiety- improved since initial treatment.   PLAN:   -Discussed lab results from today, 03/12/2023, in detail with the  patient. CBC stable. CMP stable.  Ldh wnl -patient has no lab or clinical evidence of recurrence/Progression of SMZL -answered all of patient's questions in detail regarding lymphoma -Continue to follow-up with Rheumatologist.   FOLLOW-UP: RTC with Dr Candise Che with labs in 6 months  The total time spent in the appointment was 20 minutes* .  All of the patient's questions were answered with apparent satisfaction. The patient knows to call the clinic with any problems, questions or concerns.   Wyvonnia Lora MD MS AAHIVMS [facility] Hematology/Oncology Physician [facility]  .*Total Encounter Time as defined by the Centers for Medicare and Medicaid Services includes, in addition to the face-to-face time of a patient visit (documented in the note above) non-face-to-face time: obtaining and reviewing outside history, ordering and reviewing medications, tests or procedures, care coordination (communications with other health care professionals or caregivers) and documentation in the medical record.   I,Param Shah,acting as a Neurosurgeon for Wyvonnia Lora, MD.,have documented all relevant documentation on the behalf of Wyvonnia Lora, MD,as directed by  Wyvonnia Lora, MD while in the presence of Wyvonnia Lora, MD.  .I have reviewed the above documentation for accuracy and completeness, and I agree with the above. Johney Maine MD

## 2023-09-16 ENCOUNTER — Other Ambulatory Visit: Payer: Self-pay

## 2023-09-16 DIAGNOSIS — C8307 Small cell B-cell lymphoma, spleen: Secondary | ICD-10-CM

## 2023-09-17 ENCOUNTER — Other Ambulatory Visit: Payer: BLUE CROSS/BLUE SHIELD

## 2023-09-17 ENCOUNTER — Inpatient Hospital Stay (HOSPITAL_BASED_OUTPATIENT_CLINIC_OR_DEPARTMENT_OTHER): Payer: BLUE CROSS/BLUE SHIELD | Admitting: Hematology

## 2023-09-17 ENCOUNTER — Inpatient Hospital Stay: Payer: BLUE CROSS/BLUE SHIELD | Attending: Hematology

## 2023-09-17 ENCOUNTER — Ambulatory Visit: Payer: BLUE CROSS/BLUE SHIELD | Admitting: Hematology

## 2023-09-17 VITALS — BP 129/50 | HR 59 | Temp 97.5°F | Resp 16 | Wt 229.9 lb

## 2023-09-17 DIAGNOSIS — D696 Thrombocytopenia, unspecified: Secondary | ICD-10-CM | POA: Diagnosis not present

## 2023-09-17 DIAGNOSIS — N289 Disorder of kidney and ureter, unspecified: Secondary | ICD-10-CM | POA: Diagnosis not present

## 2023-09-17 DIAGNOSIS — C8307 Small cell B-cell lymphoma, spleen: Secondary | ICD-10-CM

## 2023-09-17 DIAGNOSIS — F419 Anxiety disorder, unspecified: Secondary | ICD-10-CM | POA: Insufficient documentation

## 2023-09-17 DIAGNOSIS — Z8572 Personal history of non-Hodgkin lymphomas: Secondary | ICD-10-CM | POA: Diagnosis present

## 2023-09-17 LAB — CBC WITH DIFFERENTIAL (CANCER CENTER ONLY)
Abs Immature Granulocytes: 0.02 K/uL (ref 0.00–0.07)
Basophils Absolute: 0 K/uL (ref 0.0–0.1)
Basophils Relative: 1 %
Eosinophils Absolute: 0.2 K/uL (ref 0.0–0.5)
Eosinophils Relative: 3 %
HCT: 41.9 % (ref 36.0–46.0)
Hemoglobin: 14.2 g/dL (ref 12.0–15.0)
Immature Granulocytes: 0 %
Lymphocytes Relative: 22 %
Lymphs Abs: 1.2 K/uL (ref 0.7–4.0)
MCH: 28.4 pg (ref 26.0–34.0)
MCHC: 33.9 g/dL (ref 30.0–36.0)
MCV: 83.8 fL (ref 80.0–100.0)
Monocytes Absolute: 0.5 K/uL (ref 0.1–1.0)
Monocytes Relative: 9 %
Neutro Abs: 3.7 K/uL (ref 1.7–7.7)
Neutrophils Relative %: 65 %
Platelet Count: 158 K/uL (ref 150–400)
RBC: 5 MIL/uL (ref 3.87–5.11)
RDW: 13.2 % (ref 11.5–15.5)
WBC Count: 5.7 K/uL (ref 4.0–10.5)
nRBC: 0 % (ref 0.0–0.2)

## 2023-09-17 LAB — CMP (CANCER CENTER ONLY)
ALT: 32 U/L (ref 0–44)
AST: 21 U/L (ref 15–41)
Albumin: 4.1 g/dL (ref 3.5–5.0)
Alkaline Phosphatase: 91 U/L (ref 38–126)
Anion gap: 4 — ABNORMAL LOW (ref 5–15)
BUN: 19 mg/dL (ref 8–23)
CO2: 30 mmol/L (ref 22–32)
Calcium: 9 mg/dL (ref 8.9–10.3)
Chloride: 107 mmol/L (ref 98–111)
Creatinine: 0.69 mg/dL (ref 0.44–1.00)
GFR, Estimated: >60 mL/min (ref >60)
Glucose, Bld: 103 mg/dL — ABNORMAL HIGH (ref 70–99)
Potassium: 4.4 mmol/L (ref 3.5–5.1)
Sodium: 141 mmol/L (ref 135–145)
Total Bilirubin: 0.8 mg/dL (ref 0.0–1.2)
Total Protein: 6.1 g/dL — ABNORMAL LOW (ref 6.5–8.1)

## 2023-09-17 LAB — LACTATE DEHYDROGENASE: LDH: 146 U/L (ref 98–192)

## 2023-09-23 NOTE — Progress Notes (Signed)
 HEMATOLOGY/ONCOLOGY CLINIC NOTE  Date of Service: .09/17/2023  Patient Care Team: Kip Righter, MD as PCP - General (Family Medicine)  PCP: Lamar Ash MD   CHIEF COMPLAINTS/PURPOSE OF CONSULTATION: Follow-up for continued evaluation and management of relapsed splenic marginal zone lymphoma  HISTORY OF PRESENTING ILLNESS:   Please see previous note for details on initial presentation  INTERVAL HISTORY  Wendy Avery is a 68 y.o. female here for continued evaluation and management of splenic marginal zone lymphoma. No acute new symptoms since her last clinic visit.  She is concerned that she has gained some weight though on our scales her weight is about the same and remains at about 230 pounds. No fevers, chills, night sweats, unexpected weight loss.  No new lumps or bumps. No new shortness of breath or chest pain No abdominal pain or distention. No significant new fatigue, infections or bleeding issues.  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 #3 Right Shoulder excision of basal cell carcinoma in 2005   SOCIAL HISTORY: Social History   Socioeconomic History   Marital status: Widowed    Spouse name: Not on file   Number of children: Not on file   Years of education: Not on file   Highest education level: Not on file  Occupational History   Not on file  Tobacco Use   Smoking status: Former    Current packs/day: 0.00    Types: Cigarettes    Quit date: 2010    Years since quitting: 15.6   Smokeless tobacco: Never  Vaping Use   Vaping status: Never Used  Substance and Sexual Activity   Alcohol use: Yes    Comment: occassional    Drug use: No   Sexual activity: Not on file  Other Topics Concern   Not on file  Social History Narrative   Not on file   Social Drivers  of Health   Financial Resource Strain: Not on file  Food Insecurity: Not on file  Transportation Needs: Not on file  Physical Activity: Not on file  Stress: Not on file  Social Connections: Not on file  Intimate Partner Violence: Not on file  Ex-smoker 1 pack per day quit 30 years ago Uses alcohol socially She is widowed and has 2 daughters   FAMILY HISTORY: Mom had breast cancer at age 48 years and died from metastatic disease Father skin cancer, vocal cord cancer, alcohol abuse  ALLERGIES:  ?PCN  MEDICATIONS:  Current Outpatient Medications  Medication Sig Dispense Refill   LORazepam  (ATIVAN ) 0.5 MG tablet Take 1 tablet (0.5 mg total) by mouth every 8 (eight) hours as needed for anxiety. 30 tablet 0   naproxen  (NAPROSYN ) 375 MG tablet Take 1 tablet (375 mg total) by mouth 2 (two) times daily. 20 tablet 0   pantoprazole  (PROTONIX ) 20 MG tablet Take 1 tablet (20 mg total) by mouth daily. 30 tablet 1   No current facility-administered medications for this visit.    REVIEW OF SYSTEMS:   10 Point review of Systems was done is negative except as noted above.  PHYSICAL EXAMINATION: ECOG FS:1 - Symptomatic but completely ambulatory  Vitals:   09/17/23 0910  BP: (!) 129/50  Pulse: (!) 59  Resp: 16  Temp: (!) 97.5 F (36.4 C)  SpO2: 100%    Wt Readings from  Last 3 Encounters:  09/17/23 229 lb 14.4 oz (104.3 kg)  03/12/23 231 lb 11.2 oz (105.1 kg)  09/13/22 234 lb 3.2 oz (106.2 kg)   Body mass index is 44.9 kg/m.    GENERAL:alert, in no acute distress and comfortable SKIN: no acute rashes, no significant lesions EYES: conjunctiva are pink and non-injected, sclera anicteric OROPHARYNX: MMM, no exudates, no oropharyngeal erythema or ulceration NECK: supple, no JVD LYMPH:  no palpable lymphadenopathy in the cervical, axillary or inguinal regions LUNGS: clear to auscultation b/l with normal respiratory effort HEART: regular rate & rhythm ABDOMEN:  normoactive bowel  sounds , non tender, not distended. Extremity: no pedal edema PSYCH: alert & oriented x 3 with fluent speech NEURO: no focal motor/sensory deficits   LABORATORY DATA:  I have reviewed the data as listed     Latest Ref Rng & Units 09/17/2023    8:51 AM 03/12/2023    8:51 AM 09/13/2022    8:49 AM  CBC  WBC 4.0 - 10.5 K/uL 5.7  6.0  6.1   Hemoglobin 12.0 - 15.0 g/dL 85.7  86.4  85.7   Hematocrit 36.0 - 46.0 % 41.9  41.8  43.1   Platelets 150 - 400 K/uL 158  158  167    CBC    Component Value Date/Time   WBC 5.7 09/17/2023 0851   WBC 5.8 07/05/2020 0900   RBC 5.00 09/17/2023 0851   HGB 14.2 09/17/2023 0851   HGB 15.0 01/03/2017 1138   HCT 41.9 09/17/2023 0851   HCT 44.4 01/03/2017 1138   PLT 158 09/17/2023 0851   PLT 169 01/03/2017 1138   MCV 83.8 09/17/2023 0851   MCV 86.9 01/03/2017 1138   MCH 28.4 09/17/2023 0851   MCHC 33.9 09/17/2023 0851   RDW 13.2 09/17/2023 0851   RDW 12.9 01/03/2017 1138   LYMPHSABS 1.2 09/17/2023 0851   LYMPHSABS 1.8 01/03/2017 1138   MONOABS 0.5 09/17/2023 0851   MONOABS 0.4 01/03/2017 1138   EOSABS 0.2 09/17/2023 0851   EOSABS 0.2 01/03/2017 1138   BASOSABS 0.0 09/17/2023 0851   BASOSABS 0.0 01/03/2017 1138    .    Latest Ref Rng & Units 09/17/2023    8:51 AM 03/12/2023    8:51 AM 09/13/2022    8:49 AM  CMP  Glucose 70 - 99 mg/dL 896  895  897   BUN 8 - 23 mg/dL 19  19  14    Creatinine 0.44 - 1.00 mg/dL 9.30  9.28  9.28   Sodium 135 - 145 mmol/L 141  140  138   Potassium 3.5 - 5.1 mmol/L 4.4  4.0  4.1   Chloride 98 - 111 mmol/L 107  107  106   CO2 22 - 32 mmol/L 30  28  24    Calcium 8.9 - 10.3 mg/dL 9.0  8.9  9.1   Total Protein 6.5 - 8.1 g/dL 6.1  6.0  6.3   Total Bilirubin 0.0 - 1.2 mg/dL 0.8  0.8  0.9   Alkaline Phos 38 - 126 U/L 91  85  78   AST 15 - 41 U/L 21  17  25    ALT 0 - 44 U/L 32  23  35     Lab Results  Component Value Date   LDH 146 09/17/2023    ...   Component     Latest Ref Rng & Units 09/07/2016  Hep B  Core Ab, Tot     Negative Negative  Hepatitis B Surface Ag     Negative Negative   Component     Latest Ref Rng & Units 08/10/2016  Sed Rate     0 - 40 mm/hr 12  LDH     125 - 245 U/L 178  EBV VCA IgM     0.0 - 35.9 U/mL <36.0  CMV IgM Ser EIA-aCnc     0.0 - 29.9 AU/mL <30.0  HIV Screen 4th Generation wRfx     Non Reactive Non Reactive  Hep C Virus Ab     0.0 - 0.9 s/co ratio <0.1     07/05/2020 Surgical Pathology  BONE MARROW, ASPIRATE, CLOT, CORE:  - Mild involvement by non-Hodgkin B-cell lymphoma.   PERIPHERAL BLOOD:  - Abnormal lymphocytes present.  - Mild thrombocytopenia.   COMMENT:   The marrow is normocellular but exhibits a few small abnormal lymphoid  aggregates. Flow cytometry reveals a monoclonal B-cell population with a  similar phenotype (no CD5 or CD10 expression) to that of the patient's  prior lymphoma. The overall findings are consistent with mild  involvement by the patient's prior non-Hodgkin B-cell lymphoma (splenic  marginal zone lymphoma). While there is no absolute lymphocytosis, there  are abnormal lymphocytes which likely represent peripheral blood  involvement.   07/05/2020 Cytogenetics Report    RADIOGRAPHIC STUDIES: ABDOMEN ULTRASOUND COMPLETE   COMPARISON:  CT 08/03/2016   FINDINGS: Gallbladder: No gallstones or wall thickening visualized. No sonographic Murphy sign noted by sonographer.   Common bile duct: Diameter: 1.8 mm.   Liver: No focal lesion identified. Within normal limits in parenchymal echogenicity. Portal vein is patent on color Doppler imaging with normal direction of blood flow towards the liver.   IVC: No abnormality visualized.   Pancreas: Visualized portion unremarkable.   Spleen: Mild to moderate splenomegaly as the spleen measures 12.9 cm in greatest diameter, although splenic volume is increased measuring 785 cubic cm.   Right Kidney: Length: 10.1 cm. Echogenicity within normal limits. No mass or  hydronephrosis visualized.   Left Kidney: Length: 10.8 cm. Echogenicity within normal limits. No mass or hydronephrosis visualized.   Abdominal aorta: No aneurysm visualized.   Other findings: None.   IMPRESSION: No acute findings.   Known mild to moderate splenomegaly with splenic volume 785 cubic cm.     Electronically Signed   By: Toribio Agreste M.D.   On: 11/30/2016 10:09   ASSESSMENT & PLAN:   68 y.o. 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 Diffusely hypermetabolic on PET/CT No evidence of liver disease on CT scan. No evidence of portal hypertension. 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 Platelets today have normalized to 155k  #3 Indetermine left renal lesion 1.5 cms in size ?complex cyst - will need monitoring. No reports of this on [place]  ab in 11/2016.  #4 Severe anxiety- improved since initial treatment.   PLAN:   - Discussed patient's lab results from today 09/17/2023 CBC is within normal limits without any lymphocytosis.  There is no anemia or thrombocytopenia CMP is stable LDH is within normal limits at 126 Patient has no lab or clinical evidence of recurrence/progression of her splenic marginal zone lymphoma at this time. No indication for additional  treatment of the patient splenic marginal zone lymphoma at this time. Recommended she stay up to speed with all her age-appropriate vaccinations Patient will follow-up with her PCP to develop a plan for healthy weight loss. We shall see her back in 6 months to continue to monitor her splenic marginal zone lymphoma. Patient is aware of symptoms to monitor for. Age-appropriate cancer screening with  PCP  FOLLOW-UP: Return to clinic with Dr. Onesimo with labs in 6 months  .The total time spent in the appointment was 21 minutes* .  All of the patient's questions were answered with apparent satisfaction. The patient knows to call the clinic with any problems, questions or concerns.   Emaline Onesimo MD MS AAHIVMS [facility] Hematology/Oncology Physician [employer] [facility]  .*Total Encounter Time as defined by the Centers for Medicare and Medicaid Services includes, in addition to the face-to-face time of a patient visit (documented in the note above) non-face-to-face time: obtaining and reviewing outside history, ordering and reviewing medications, tests or procedures, care coordination (communications with other health care professionals or caregivers) and documentation in the medical record.

## 2024-03-17 ENCOUNTER — Inpatient Hospital Stay

## 2024-03-17 ENCOUNTER — Inpatient Hospital Stay: Admitting: Hematology
